# Patient Record
Sex: Male | Born: 1940 | Race: White | Hispanic: No | Marital: Married | Smoking: Never smoker
Health system: Southern US, Community
[De-identification: ages and names within clinical notes are randomized; demographics above are authoritative.]

## PROBLEM LIST (undated history)

## (undated) DIAGNOSIS — Z9289 Personal history of other medical treatment: Secondary | ICD-10-CM

## (undated) DIAGNOSIS — Z860100 Personal history of colon polyps, unspecified: Secondary | ICD-10-CM

## (undated) DIAGNOSIS — R000 Tachycardia, unspecified: Secondary | ICD-10-CM

## (undated) DIAGNOSIS — K5730 Diverticulosis of large intestine without perforation or abscess without bleeding: Secondary | ICD-10-CM

## (undated) DIAGNOSIS — G40909 Epilepsy, unspecified, not intractable, without status epilepticus: Secondary | ICD-10-CM

## (undated) DIAGNOSIS — K219 Gastro-esophageal reflux disease without esophagitis: Secondary | ICD-10-CM

## (undated) DIAGNOSIS — Z87442 Personal history of urinary calculi: Secondary | ICD-10-CM

## (undated) DIAGNOSIS — E785 Hyperlipidemia, unspecified: Secondary | ICD-10-CM

## (undated) DIAGNOSIS — A0472 Enterocolitis due to Clostridium difficile, not specified as recurrent: Secondary | ICD-10-CM

## (undated) HISTORY — DX: Epilepsy, unspecified, not intractable, without status epilepticus: G40.909

## (undated) HISTORY — DX: Tachycardia, unspecified: R00.0

## (undated) HISTORY — DX: Gastro-esophageal reflux disease without esophagitis: K21.9

## (undated) HISTORY — DX: Personal history of urinary calculi: Z87.442

## (undated) HISTORY — DX: Diverticulosis of large intestine without perforation or abscess without bleeding: K57.30

## (undated) HISTORY — DX: Personal history of other medical treatment: Z92.89

## (undated) HISTORY — PX: OTHER SURGICAL HISTORY: SHX169

## (undated) HISTORY — DX: Personal history of colon polyps, unspecified: Z86.0100

## (undated) HISTORY — PX: ADENOIDECTOMY: SHX5191

## (undated) HISTORY — DX: Enterocolitis due to Clostridium difficile, not specified as recurrent: A04.72

## (undated) HISTORY — PX: TONSILLECTOMY: SHX5217

## (undated) HISTORY — PX: SALIVARY GLAND SURGERY: SHX768

## (undated) HISTORY — DX: Hyperlipidemia, unspecified: E78.5

## (undated) HISTORY — PX: APPENDECTOMY: SHX54

## (undated) HISTORY — DX: Personal history of colonic polyps: Z86.010

## (undated) HISTORY — PX: CHOLECYSTECTOMY: SHX55

---

## 1998-06-23 ENCOUNTER — Emergency Department (HOSPITAL_COMMUNITY): Admission: EM | Admit: 1998-06-23 | Discharge: 1998-06-23 | Payer: Self-pay | Admitting: Emergency Medicine

## 1998-06-23 ENCOUNTER — Encounter: Payer: Self-pay | Admitting: Emergency Medicine

## 1998-12-01 ENCOUNTER — Encounter: Payer: Self-pay | Admitting: Urology

## 1998-12-01 ENCOUNTER — Ambulatory Visit (HOSPITAL_COMMUNITY): Admission: RE | Admit: 1998-12-01 | Discharge: 1998-12-01 | Payer: Self-pay | Admitting: Urology

## 2000-06-19 DIAGNOSIS — A0472 Enterocolitis due to Clostridium difficile, not specified as recurrent: Secondary | ICD-10-CM

## 2000-06-19 HISTORY — DX: Enterocolitis due to Clostridium difficile, not specified as recurrent: A04.72

## 2000-07-03 ENCOUNTER — Encounter: Payer: Self-pay | Admitting: Gastroenterology

## 2000-07-03 ENCOUNTER — Inpatient Hospital Stay (HOSPITAL_COMMUNITY): Admission: EM | Admit: 2000-07-03 | Discharge: 2000-07-06 | Payer: Self-pay | Admitting: Emergency Medicine

## 2000-07-04 ENCOUNTER — Encounter: Payer: Self-pay | Admitting: Internal Medicine

## 2001-12-27 ENCOUNTER — Encounter: Payer: Self-pay | Admitting: Cardiology

## 2005-05-18 ENCOUNTER — Ambulatory Visit: Payer: Self-pay | Admitting: Internal Medicine

## 2005-05-22 ENCOUNTER — Ambulatory Visit: Payer: Self-pay | Admitting: Internal Medicine

## 2005-05-31 ENCOUNTER — Ambulatory Visit: Payer: Self-pay | Admitting: Internal Medicine

## 2005-06-23 ENCOUNTER — Ambulatory Visit: Payer: Self-pay | Admitting: Cardiology

## 2005-06-23 ENCOUNTER — Ambulatory Visit: Payer: Self-pay | Admitting: Internal Medicine

## 2005-06-29 ENCOUNTER — Ambulatory Visit: Payer: Self-pay | Admitting: Cardiology

## 2005-07-21 ENCOUNTER — Ambulatory Visit: Payer: Self-pay | Admitting: Cardiology

## 2005-07-21 ENCOUNTER — Encounter: Payer: Self-pay | Admitting: Cardiology

## 2005-07-21 ENCOUNTER — Ambulatory Visit: Payer: Self-pay

## 2005-07-24 ENCOUNTER — Ambulatory Visit (HOSPITAL_COMMUNITY): Admission: RE | Admit: 2005-07-24 | Discharge: 2005-07-24 | Payer: Self-pay | Admitting: Internal Medicine

## 2005-07-24 ENCOUNTER — Ambulatory Visit: Payer: Self-pay | Admitting: Internal Medicine

## 2005-12-15 ENCOUNTER — Encounter: Payer: Self-pay | Admitting: Cardiology

## 2006-06-05 ENCOUNTER — Ambulatory Visit: Payer: Self-pay | Admitting: Internal Medicine

## 2006-06-05 LAB — CONVERTED CEMR LAB
AST: 21 units/L (ref 0–37)
Alkaline Phosphatase: 33 units/L — ABNORMAL LOW (ref 39–117)
BUN: 22 mg/dL (ref 6–23)
Basophils Absolute: 0 10*3/uL (ref 0.0–0.1)
Basophils Relative: 0.6 % (ref 0.0–1.0)
Bilirubin Urine: NEGATIVE
Cholesterol: 177 mg/dL (ref 0–200)
Creatinine, Ser: 1.2 mg/dL (ref 0.4–1.5)
Eosinophil percent: 6.3 % — ABNORMAL HIGH (ref 0.0–5.0)
GFR calc non Af Amer: 64 mL/min
Glomerular Filtration Rate, Af Am: 78 mL/min/{1.73_m2}
Hemoglobin, Urine: NEGATIVE
LDL Cholesterol: 125 mg/dL — ABNORMAL HIGH (ref 0–99)
Leukocytes, UA: NEGATIVE
Monocytes Absolute: 0.5 10*3/uL (ref 0.2–0.7)
Neutro Abs: 2.1 10*3/uL (ref 1.4–7.7)
Platelets: 164 10*3/uL (ref 150–400)
Potassium: 4.2 meq/L (ref 3.5–5.1)
RBC: 5.15 M/uL (ref 4.22–5.81)
RDW: 12.8 % (ref 11.5–14.6)
Sodium: 136 meq/L (ref 135–145)
TSH: 3.27 microintl units/mL (ref 0.35–5.50)
Triglyceride fasting, serum: 74 mg/dL (ref 0–149)
Urine Glucose: NEGATIVE mg/dL
Urobilinogen, UA: 1 (ref 0.0–1.0)
WBC: 4.3 10*3/uL — ABNORMAL LOW (ref 4.5–10.5)
pH: 6 (ref 5.0–8.0)

## 2006-06-08 ENCOUNTER — Ambulatory Visit: Payer: Self-pay | Admitting: Internal Medicine

## 2006-06-14 ENCOUNTER — Ambulatory Visit: Payer: Self-pay | Admitting: Internal Medicine

## 2006-11-21 ENCOUNTER — Encounter: Payer: Self-pay | Admitting: Cardiology

## 2007-04-08 ENCOUNTER — Telehealth (INDEPENDENT_AMBULATORY_CARE_PROVIDER_SITE_OTHER): Payer: Self-pay | Admitting: *Deleted

## 2007-05-20 HISTORY — PX: FRACTURE SURGERY: SHX138

## 2007-06-21 ENCOUNTER — Telehealth (INDEPENDENT_AMBULATORY_CARE_PROVIDER_SITE_OTHER): Payer: Self-pay | Admitting: *Deleted

## 2007-06-25 ENCOUNTER — Inpatient Hospital Stay (HOSPITAL_COMMUNITY): Admission: EM | Admit: 2007-06-25 | Discharge: 2007-06-28 | Payer: Self-pay | Admitting: Emergency Medicine

## 2007-06-26 ENCOUNTER — Ambulatory Visit: Payer: Self-pay | Admitting: Physical Medicine & Rehabilitation

## 2007-06-28 ENCOUNTER — Inpatient Hospital Stay (HOSPITAL_COMMUNITY)
Admission: RE | Admit: 2007-06-28 | Discharge: 2007-07-06 | Payer: Self-pay | Admitting: Physical Medicine & Rehabilitation

## 2007-07-01 ENCOUNTER — Encounter: Payer: Self-pay | Admitting: Physical Medicine & Rehabilitation

## 2007-07-01 ENCOUNTER — Ambulatory Visit: Payer: Self-pay | Admitting: Surgery

## 2007-07-06 ENCOUNTER — Encounter: Payer: Self-pay | Admitting: Endocrinology

## 2007-07-31 ENCOUNTER — Encounter: Payer: Self-pay | Admitting: Internal Medicine

## 2007-08-12 ENCOUNTER — Encounter: Payer: Self-pay | Admitting: Internal Medicine

## 2007-11-12 ENCOUNTER — Ambulatory Visit: Payer: Self-pay | Admitting: Internal Medicine

## 2007-11-12 DIAGNOSIS — Z87442 Personal history of urinary calculi: Secondary | ICD-10-CM

## 2007-11-12 DIAGNOSIS — K5730 Diverticulosis of large intestine without perforation or abscess without bleeding: Secondary | ICD-10-CM | POA: Insufficient documentation

## 2007-11-12 DIAGNOSIS — K219 Gastro-esophageal reflux disease without esophagitis: Secondary | ICD-10-CM

## 2007-11-12 DIAGNOSIS — Z860100 Personal history of colon polyps, unspecified: Secondary | ICD-10-CM | POA: Insufficient documentation

## 2007-11-12 DIAGNOSIS — M810 Age-related osteoporosis without current pathological fracture: Secondary | ICD-10-CM | POA: Insufficient documentation

## 2007-11-12 DIAGNOSIS — E785 Hyperlipidemia, unspecified: Secondary | ICD-10-CM | POA: Insufficient documentation

## 2007-11-25 ENCOUNTER — Encounter: Payer: Self-pay | Admitting: Cardiology

## 2008-06-01 ENCOUNTER — Encounter: Payer: Self-pay | Admitting: Cardiology

## 2008-08-13 ENCOUNTER — Ambulatory Visit: Payer: Self-pay | Admitting: Vascular Surgery

## 2008-08-13 ENCOUNTER — Ambulatory Visit: Admission: RE | Admit: 2008-08-13 | Discharge: 2008-08-13 | Payer: Self-pay | Admitting: Orthopedic Surgery

## 2008-08-13 ENCOUNTER — Encounter (INDEPENDENT_AMBULATORY_CARE_PROVIDER_SITE_OTHER): Payer: Self-pay | Admitting: Orthopedic Surgery

## 2008-09-03 ENCOUNTER — Encounter: Payer: Self-pay | Admitting: Internal Medicine

## 2008-09-03 ENCOUNTER — Ambulatory Visit: Payer: Self-pay | Admitting: Family Medicine

## 2008-10-23 ENCOUNTER — Encounter: Payer: Self-pay | Admitting: Cardiology

## 2008-11-12 ENCOUNTER — Ambulatory Visit: Payer: Self-pay | Admitting: Internal Medicine

## 2008-12-29 ENCOUNTER — Encounter: Payer: Self-pay | Admitting: Cardiology
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
## 2009-07-20 ENCOUNTER — Ambulatory Visit: Payer: Self-pay | Admitting: Internal Medicine

## 2009-07-20 DIAGNOSIS — R0609 Other forms of dyspnea: Secondary | ICD-10-CM | POA: Insufficient documentation

## 2009-07-20 DIAGNOSIS — R0989 Other specified symptoms and signs involving the circulatory and respiratory systems: Secondary | ICD-10-CM

## 2009-07-22 ENCOUNTER — Ambulatory Visit: Payer: Self-pay | Admitting: Internal Medicine

## 2009-07-29 ENCOUNTER — Ambulatory Visit (HOSPITAL_COMMUNITY): Admission: RE | Admit: 2009-07-29 | Discharge: 2009-07-29 | Payer: Self-pay | Admitting: Internal Medicine

## 2009-07-29 ENCOUNTER — Encounter: Payer: Self-pay | Admitting: Internal Medicine

## 2009-07-29 ENCOUNTER — Ambulatory Visit: Payer: Self-pay

## 2009-07-29 ENCOUNTER — Ambulatory Visit: Payer: Self-pay | Admitting: Cardiology

## 2009-07-30 ENCOUNTER — Encounter: Payer: Self-pay | Admitting: Internal Medicine

## 2009-07-30 ENCOUNTER — Telehealth: Payer: Self-pay | Admitting: Internal Medicine

## 2009-07-30 DIAGNOSIS — I428 Other cardiomyopathies: Secondary | ICD-10-CM

## 2009-08-03 ENCOUNTER — Ambulatory Visit: Payer: Self-pay | Admitting: Internal Medicine

## 2009-08-11 DIAGNOSIS — G40909 Epilepsy, unspecified, not intractable, without status epilepticus: Secondary | ICD-10-CM | POA: Insufficient documentation

## 2009-08-11 DIAGNOSIS — R569 Unspecified convulsions: Secondary | ICD-10-CM | POA: Insufficient documentation

## 2009-08-12 ENCOUNTER — Ambulatory Visit: Payer: Self-pay | Admitting: Cardiology

## 2009-08-18 ENCOUNTER — Telehealth (INDEPENDENT_AMBULATORY_CARE_PROVIDER_SITE_OTHER): Payer: Self-pay | Admitting: *Deleted

## 2009-08-19 ENCOUNTER — Ambulatory Visit: Payer: Self-pay | Admitting: Cardiology

## 2009-08-21 ENCOUNTER — Ambulatory Visit (HOSPITAL_COMMUNITY): Admission: RE | Admit: 2009-08-21 | Discharge: 2009-08-21 | Payer: Self-pay | Admitting: Obstetrics and Gynecology

## 2009-08-23 ENCOUNTER — Ambulatory Visit: Payer: Self-pay | Admitting: Cardiology

## 2009-08-23 ENCOUNTER — Inpatient Hospital Stay (HOSPITAL_BASED_OUTPATIENT_CLINIC_OR_DEPARTMENT_OTHER): Admission: RE | Admit: 2009-08-23 | Discharge: 2009-08-23 | Payer: Self-pay | Admitting: Cardiology

## 2009-08-23 ENCOUNTER — Encounter (INDEPENDENT_AMBULATORY_CARE_PROVIDER_SITE_OTHER): Payer: Self-pay | Admitting: *Deleted

## 2009-08-24 ENCOUNTER — Telehealth: Payer: Self-pay | Admitting: Cardiology

## 2009-08-27 ENCOUNTER — Ambulatory Visit: Payer: Self-pay | Admitting: Cardiology

## 2009-08-27 ENCOUNTER — Ambulatory Visit (HOSPITAL_COMMUNITY): Admission: RE | Admit: 2009-08-27 | Discharge: 2009-08-27 | Payer: Self-pay | Admitting: Cardiology

## 2009-08-31 ENCOUNTER — Ambulatory Visit: Payer: Self-pay | Admitting: Cardiology

## 2009-08-31 DIAGNOSIS — I2510 Atherosclerotic heart disease of native coronary artery without angina pectoris: Secondary | ICD-10-CM | POA: Insufficient documentation

## 2009-09-02 LAB — CONVERTED CEMR LAB
Albumin, U: DETECTED %
Albumin: 3.8 g/dL (ref 3.5–5.2)
Alpha 1, Urine: DETECTED % — AB
Alpha-1-Globulin: 3.9 % (ref 2.9–4.9)
CO2: 27 meq/L (ref 19–32)
Calcium: 9.1 mg/dL (ref 8.4–10.5)
Cholesterol: 176 mg/dL (ref 0–200)
Creatinine, Ser: 1.1 mg/dL (ref 0.4–1.5)
Ferritin: 361 ng/mL — ABNORMAL HIGH (ref 22.0–322.0)
Gamma Globulin, Urine: DETECTED % — AB
Gamma Globulin: 13.6 % (ref 11.1–18.8)
Glucose, Bld: 98 mg/dL (ref 70–99)
INR: 1 (ref 0.8–1.0)
LDL Cholesterol: 126 mg/dL — ABNORMAL HIGH (ref 0–99)
Potassium: 4.9 meq/L (ref 3.5–5.1)
Pro B Natriuretic peptide (BNP): 89 pg/mL (ref 0.0–100.0)
Prothrombin Time: 10.6 s (ref 9.1–11.7)
Total Bilirubin: 0.9 mg/dL (ref 0.3–1.2)
Total Protein, Serum Electrophoresis: 7.1 g/dL (ref 6.0–8.3)
Total Protein, Urine: 2.7 mg/dL
Total Protein: 6.5 g/dL (ref 6.0–8.3)

## 2009-10-01 ENCOUNTER — Ambulatory Visit: Payer: Self-pay | Admitting: Internal Medicine

## 2009-10-01 ENCOUNTER — Ambulatory Visit: Payer: Self-pay

## 2009-10-01 DIAGNOSIS — I493 Ventricular premature depolarization: Secondary | ICD-10-CM

## 2009-10-06 ENCOUNTER — Telehealth: Payer: Self-pay | Admitting: Cardiology

## 2009-10-13 ENCOUNTER — Telehealth: Payer: Self-pay | Admitting: Internal Medicine

## 2009-10-22 ENCOUNTER — Encounter: Payer: Self-pay | Admitting: Internal Medicine

## 2009-11-01 ENCOUNTER — Ambulatory Visit (HOSPITAL_COMMUNITY): Admission: RE | Admit: 2009-11-01 | Discharge: 2009-11-01 | Payer: Self-pay | Admitting: Internal Medicine

## 2009-11-01 ENCOUNTER — Encounter: Payer: Self-pay | Admitting: Cardiology

## 2009-11-01 ENCOUNTER — Ambulatory Visit: Payer: Self-pay | Admitting: Internal Medicine

## 2009-11-05 ENCOUNTER — Telehealth: Payer: Self-pay | Admitting: Internal Medicine

## 2009-11-08 ENCOUNTER — Telehealth: Payer: Self-pay | Admitting: Internal Medicine

## 2009-11-11 ENCOUNTER — Telehealth: Payer: Self-pay | Admitting: Internal Medicine

## 2009-11-12 ENCOUNTER — Telehealth (INDEPENDENT_AMBULATORY_CARE_PROVIDER_SITE_OTHER): Payer: Self-pay | Admitting: *Deleted

## 2009-11-12 ENCOUNTER — Telehealth: Payer: Self-pay | Admitting: Internal Medicine

## 2009-11-16 ENCOUNTER — Ambulatory Visit: Payer: Self-pay | Admitting: Internal Medicine

## 2009-11-16 LAB — CONVERTED CEMR LAB
ALT: 36 units/L (ref 0–53)
AST: 30 units/L (ref 0–37)
BUN: 23 mg/dL (ref 6–23)
Bilirubin, Direct: 0.1 mg/dL (ref 0.0–0.3)
Creatinine, Ser: 1.2 mg/dL (ref 0.4–1.5)
Eosinophils Relative: 8.1 % — ABNORMAL HIGH (ref 0.0–5.0)
Glucose, Bld: 107 mg/dL — ABNORMAL HIGH (ref 70–99)
HCT: 48.9 % (ref 39.0–52.0)
Hemoglobin: 16.9 g/dL (ref 13.0–17.0)
LDL Cholesterol: 62 mg/dL (ref 0–99)
Leukocytes, UA: NEGATIVE
Lymphocytes Relative: 31.3 % (ref 12.0–46.0)
MCHC: 34.4 g/dL (ref 30.0–36.0)
MCV: 95.6 fL (ref 78.0–100.0)
Monocytes Absolute: 0.8 10*3/uL (ref 0.1–1.0)
Monocytes Relative: 11.6 % (ref 3.0–12.0)
Neutrophils Relative %: 48.3 % (ref 43.0–77.0)
Nitrite: POSITIVE
RBC: 5.12 M/uL (ref 4.22–5.81)
RDW: 13.9 % (ref 11.5–14.6)
Specific Gravity, Urine: >=1.03 (ref 1.000–1.030)
TSH: 3.28 microintl units/mL (ref 0.35–5.50)
Triglycerides: 149 mg/dL (ref 0.0–149.0)
Urine Glucose: NEGATIVE mg/dL
Urobilinogen, UA: 1 (ref 0.0–1.0)
pH: 5.5 (ref 5.0–8.0)

## 2009-11-18 ENCOUNTER — Ambulatory Visit: Payer: Self-pay | Admitting: Cardiology

## 2009-12-03 ENCOUNTER — Ambulatory Visit: Payer: Self-pay | Admitting: Internal Medicine

## 2009-12-03 DIAGNOSIS — I472 Ventricular tachycardia: Secondary | ICD-10-CM

## 2009-12-03 LAB — CONVERTED CEMR LAB
BUN: 25 mg/dL — ABNORMAL HIGH (ref 6–23)
Basophils Absolute: 0 10*3/uL (ref 0.0–0.1)
Calcium: 9.5 mg/dL (ref 8.4–10.5)
Creatinine, Ser: 1.1 mg/dL (ref 0.4–1.5)
Eosinophils Absolute: 0.6 10*3/uL (ref 0.0–0.7)
GFR calc non Af Amer: 68.75 mL/min (ref >60)
Hemoglobin: 15.5 g/dL (ref 13.0–17.0)
Lymphocytes Relative: 33.6 % (ref 12.0–46.0)
Lymphs Abs: 2 10*3/uL (ref 0.7–4.0)
MCHC: 35.3 g/dL (ref 30.0–36.0)
MCV: 95.1 fL (ref 78.0–100.0)
Monocytes Absolute: 0.6 10*3/uL (ref 0.1–1.0)
Platelets: 141 10*3/uL — ABNORMAL LOW (ref 150.0–400.0)
WBC: 6.1 10*3/uL (ref 4.5–10.5)

## 2009-12-10 ENCOUNTER — Ambulatory Visit (HOSPITAL_COMMUNITY): Admission: RE | Admit: 2009-12-10 | Discharge: 2009-12-11 | Payer: Self-pay | Admitting: Internal Medicine

## 2009-12-10 ENCOUNTER — Ambulatory Visit: Payer: Self-pay | Admitting: Internal Medicine

## 2009-12-13 ENCOUNTER — Encounter (INDEPENDENT_AMBULATORY_CARE_PROVIDER_SITE_OTHER): Payer: Self-pay | Admitting: *Deleted

## 2009-12-14 ENCOUNTER — Encounter: Payer: Self-pay | Admitting: Cardiovascular Disease

## 2009-12-14 ENCOUNTER — Ambulatory Visit: Payer: Self-pay | Admitting: Internal Medicine

## 2009-12-22 ENCOUNTER — Ambulatory Visit: Payer: Self-pay | Admitting: Cardiology

## 2010-01-23 ENCOUNTER — Encounter: Payer: Self-pay | Admitting: Internal Medicine

## 2010-01-24 ENCOUNTER — Encounter: Payer: Self-pay | Admitting: Internal Medicine

## 2010-01-24 ENCOUNTER — Ambulatory Visit: Payer: Self-pay | Admitting: Internal Medicine

## 2010-01-24 ENCOUNTER — Ambulatory Visit (HOSPITAL_COMMUNITY): Admission: RE | Admit: 2010-01-24 | Discharge: 2010-01-24 | Payer: Self-pay | Admitting: Internal Medicine

## 2010-02-01 ENCOUNTER — Encounter: Payer: Self-pay | Admitting: Cardiology

## 2010-03-22 ENCOUNTER — Ambulatory Visit: Payer: Self-pay | Admitting: Cardiology

## 2010-03-29 ENCOUNTER — Inpatient Hospital Stay (HOSPITAL_COMMUNITY): Admission: RE | Admit: 2010-03-29 | Discharge: 2010-03-30 | Payer: Self-pay | Admitting: Orthopedic Surgery

## 2010-04-28 ENCOUNTER — Telehealth: Payer: Self-pay | Admitting: Cardiology

## 2010-05-27 ENCOUNTER — Telehealth (INDEPENDENT_AMBULATORY_CARE_PROVIDER_SITE_OTHER): Payer: Self-pay | Admitting: *Deleted

## 2010-06-17 ENCOUNTER — Encounter: Payer: Self-pay | Admitting: Internal Medicine

## 2010-07-04 ENCOUNTER — Telehealth: Payer: Self-pay | Admitting: Cardiology

## 2010-07-17 LAB — CONVERTED CEMR LAB
ALT: 45 units/L (ref 0–53)
Albumin: 4.2 g/dL (ref 3.5–5.2)
Albumin: 4.3 g/dL (ref 3.5–5.2)
Alkaline Phosphatase: 37 units/L — ABNORMAL LOW (ref 39–117)
Alkaline Phosphatase: 45 units/L (ref 39–117)
BUN: 20 mg/dL (ref 6–23)
Basophils Absolute: 0 10*3/uL (ref 0.0–0.1)
Basophils Relative: 0.2 % (ref 0.0–3.0)
Basophils Relative: 0.7 % (ref 0.0–1.0)
Bilirubin Urine: NEGATIVE
Bilirubin Urine: NEGATIVE
Bilirubin Urine: NEGATIVE
Bilirubin, Direct: 0.2 mg/dL (ref 0.0–0.3)
Bilirubin, Direct: 0.2 mg/dL (ref 0.0–0.3)
CO2: 29 meq/L (ref 19–32)
CO2: 29 meq/L (ref 19–32)
Calcium: 9.4 mg/dL (ref 8.4–10.5)
Calcium: 9.5 mg/dL (ref 8.4–10.5)
Chloride: 106 meq/L (ref 96–112)
Chloride: 106 meq/L (ref 96–112)
Chloride: 109 meq/L (ref 96–112)
Cholesterol: 187 mg/dL (ref 0–200)
Creatinine, Ser: 1.1 mg/dL (ref 0.4–1.5)
Eosinophils Relative: 3 % (ref 0.0–5.0)
GFR calc non Af Amer: 63.56 mL/min (ref >60)
Glucose, Bld: 103 mg/dL — ABNORMAL HIGH (ref 70–99)
Glucose, Bld: 96 mg/dL (ref 70–99)
HDL: 35.9 mg/dL — ABNORMAL LOW (ref >39.0)
Hemoglobin, Urine: NEGATIVE
Hemoglobin, Urine: NEGATIVE
Hemoglobin: 16.3 g/dL (ref 13.0–17.0)
Hemoglobin: 16.6 g/dL (ref 13.0–17.0)
Hemoglobin: 16.8 g/dL (ref 13.0–17.0)
LDL Cholesterol: 137 mg/dL — ABNORMAL HIGH (ref 0–99)
Leukocytes, UA: NEGATIVE
Leukocytes, UA: NEGATIVE
Leukocytes, UA: NEGATIVE
Lymphocytes Relative: 32.3 % (ref 12.0–46.0)
MCV: 91.6 fL (ref 78.0–100.0)
MCV: 91.6 fL (ref 78.0–100.0)
Monocytes Relative: 9.2 % (ref 3.0–12.0)
Monocytes Relative: 9.9 % (ref 3.0–12.0)
Neutrophils Relative %: 53.5 % (ref 43.0–77.0)
PSA: 0.37 ng/mL (ref 0.10–4.00)
PSA: 0.4 ng/mL (ref 0.10–4.00)
Platelets: 161 10*3/uL (ref 150.0–400.0)
Potassium: 4.4 meq/L (ref 3.5–5.1)
RBC: 5.13 M/uL (ref 4.22–5.81)
RBC: 5.25 M/uL (ref 4.22–5.81)
RDW: 12.1 % (ref 11.5–14.6)
RDW: 12.7 % (ref 11.5–14.6)
Sodium: 141 meq/L (ref 135–145)
Specific Gravity, Urine: >=1.03 (ref 1.000–1.03)
Specific Gravity, Urine: >=1.03 (ref 1.000–1.030)
Total Bilirubin: 1.3 mg/dL — ABNORMAL HIGH (ref 0.3–1.2)
Total CHOL/HDL Ratio: 5.1
Total Protein, Urine: NEGATIVE mg/dL
Total Protein, Urine: NEGATIVE mg/dL
Total Protein: 7.7 g/dL (ref 6.0–8.3)
Triglycerides: 83 mg/dL (ref 0.0–149.0)
Urine Glucose: NEGATIVE mg/dL
Urine Glucose: NEGATIVE mg/dL
Vit D, 1,25-Dihydroxy: 48 (ref 30–89)
WBC: 5.4 10*3/uL (ref 4.5–10.5)
WBC: 5.8 10*3/uL (ref 4.5–10.5)
WBC: 7.5 10*3/uL (ref 4.5–10.5)
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
## 2010-07-19 NOTE — Letter (Signed)
Summary: Cardiac Catheterization Instructions- [facility]  [facility], Main Office  [address]   [place], [place] [address]   Phone: [phone]  Fax: [phone]     08/12/2009 MRN: [id number]  PHAT [facility] [address] [place], [place]  [address]  Dear Mr. Aidynn, Krenn   You are scheduled for a Cardiac Catheterization on Monday March 7,2011 with Dr. Marca Ancona .  Please arrive to the 1st floor of the Heart and Vascular Center at [facility] at 7:30 am  on the day of your procedure. Please do not arrive before 6:30 a.m. Call the Heart and Vascular Center at [phone] if you are unable to make your appointmnet. The Code to get into the parking garage under the building is 9000. Take the elevators to the 1st floor. You must have someone to drive you home. Someone must be with you for the first 24 hours after you arrive home. Please wear clothes that are easy to get on and off and wear slip-on shoes. Do not eat or drink after midnight except water with your medications that morning. Bring all your medications and current insurance cards with you.    _x__ You may take ALL of your medications with water that morning.    The usual length of stay after your procedure is 2 to 3 hours. This can vary.  If you have any questions, please call the office at the number listed above.   Katina Dung, RN, BSN

## 2010-07-19 NOTE — Letter (Signed)
Summary: [facility] 2005 - 2007  [facility] 2005 - 2007   Imported By: Roderic Ovens 09/22/2009 16:22:15  _____________________________________________________________________  External Attachment:    Type:   Image     Comment:   External Document

## 2010-07-19 NOTE — Letter (Signed)
Summary: Generic Letter  Generic Letter   Imported By: Roderic Ovens 09/22/2009 16:19:50  _____________________________________________________________________  External Attachment:    Type:   Image     Comment:   External Document
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
## 2010-07-19 NOTE — Progress Notes (Signed)
Summary: f/u appt?  Phone Note Call from Patient Call back at Home Phone [phone]   Caller: Patient Summary of Call: pt called stating that he had appt with MD last week and was told to schedule 2 week f/u. pt has f/u schedule 02/15 and would like to know if MD still wants to see him since all his labs and CXR came back normal? Pt stated that he will only come if Dr. Jonny Ruiz says so.  Follow-up for Phone Call        he is correct; now that we have figured out the problem is most likely his heart condition;  he will be seen by cardiology soon, and then can be re-scheduled for me for 6 months - ROV Follow-up by: Corwin Levins MD,  July 30, 2009 5:20 PM  Additional Follow-up for Phone Call Additional follow up Details #1::        Patient notified and states that he has decided to come in tomorrow and see the MD anyway. Additional Follow-up by: Lucious Groves,  August 02, 2009 9:37 AM
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
## 2010-07-19 NOTE — Procedures (Signed)
Summary: summary report  summary report   Imported By: Mirna Mires 12/22/2009 12:30:12  _____________________________________________________________________  External Attachment:    Type:   Image     Comment:   External Document

## 2010-07-19 NOTE — Progress Notes (Signed)
Summary: [facility] Daily Weight Chart   [facility] Daily Weight Chart   Imported By: Roderic Ovens 01/27/2010 12:49:58  _____________________________________________________________________  External Attachment:    Type:   Image     Comment:   External Document

## 2010-07-19 NOTE — Cardiovascular Report (Signed)
Summary: Pre Cath Orders  Pre Cath Orders   Imported By: Roderic Ovens 08/31/2009 10:02:32  _____________________________________________________________________  External Attachment:    Type:   Image     Comment:   External Document

## 2010-07-19 NOTE — Procedures (Signed)
Summary: Holter and Event  Holter and Event   Imported By: Erle Crocker 10/22/2009 10:15:46  _____________________________________________________________________  External Attachment:    Type:   Image     Comment:   External Document
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
## 2010-07-19 NOTE — Progress Notes (Signed)
  Phone Note Outgoing Call   Details of Action Taken:  rei Summary of Call: I spoke with Dr. Despina Arias in [facility], the patient's neurologist.  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  He should be ok to start ASA 81 mg daily.  Dr. Epimenio Foot will have him back to the office and reimage him with an MRI.      Appended Document:  I spoke with the patient, he will start 81 mg ASA daily.
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
## 2010-07-19 NOTE — Letter (Signed)
Summary: ELectrophysiology/Ablation Procedure Instructions  [employer], Main Office  [address]   [place], [place] [address]   Phone: [phone]  Fax: [phone]     Electrophysiology/Ablation Procedure Instructions    You are scheduled for a(n) ablation on Friday, December 10, 2009 at 7:30 AM with Dr. Graciela Husbands and Allred.  1.  Please come to the Short Stay Center at [facility] at 5:30 AM on the day of your procedure.  2.  Come prepared to stay overnight.   Please bring your insurance cards and a list of your medications.  3.  Come to the [place] office on December 03, 2009 for lab work.  The lab at [facility] is open from 8:30 AM to 1:30 PM and 2:30 PM to 5:00 PM.  The lab at [facility] is open from 7:30 AM to 5:30 PM.  You do not have to be fasting.  4.  Do not have anything to eat or drink after midnight the night before your procedure.  5.  Do NOT take your Metoprolol morning of procedure.  All of your remaining medications may be taken with a small amount of water.     * Occasionally, EP studies and ablations can become lengthy.  Please make your family aware of this before your procedure starts.  Average time ranges from 2-8 hours for EP studies/ablations.  Your physician will locate your family after the procedure with the results.  * If you have any questions after you get home, please call the office at [phone].

## 2010-07-19 NOTE — Progress Notes (Signed)
Summary: set up ablation   Phone Note Call from Patient Call back at Home Phone [phone]   Caller: Patient Reason for Call: Talk to Nurse Summary of Call: pt wants to set up ablation , or does he need to come in office to see sk. Initial call taken by: Lorne Skeens,  October 13, 2009 9:14 AM  Follow-up for Phone Call        Talked with [name] and he will set up an appointment with Dr. Graciela Husbands to discuss treatment plan and results of Holter. Follow-up by: Lisabeth Devoid RN,  October 13, 2009 9:54 AM

## 2010-07-19 NOTE — Progress Notes (Signed)
Summary: pt calling re which dr to see  Phone Note Call from Patient   Caller: Patient [phone] Reason for Call: Talk to Nurse Summary of Call: pt calling re dr Georgian Co and allred having seen this pt, and according to him they were going to get together to see who will take over his care, he has not heard from [place] regarding this-pl call Initial call taken by: Glynda Jaeger,  April 28, 2010 2:21 PM  Follow-up for Phone Call        Talked with pt--he will schedule a followup with Dr Johney Frame in February 2012(according to Dr Jenel Lucks 01/24/10 note and Dr Alford Highland 04/01/10 note
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
## 2010-07-19 NOTE — Miscellaneous (Signed)
Summary: Orders Update pft charges  Clinical Lists Changes  Orders: Added new Service order of Carbon Monoxide diffusing w/capacity (94720) - Signed Added new Service order of Lung Volumes (94240) - Signed Added new Service order of Spirometry (Pre & Post) (94060) - Signed 

## 2010-07-19 NOTE — Letter (Signed)
Summary: [facility] Surgical Clearance   [facility] Surgical Clearance   Imported By: Roderic Ovens 02/07/2010 10:26:03  _____________________________________________________________________  External Attachment:    Type:   Image     Comment:   External Document

## 2010-07-19 NOTE — Progress Notes (Signed)
Summary: test result - ekg from [facility]  Phone Note Call from Patient Call back at Home Phone [phone] Call back at [phone]   Caller: Patient Reason for Call: Talk to Nurse, Lab or Test Results Summary of Call: status of ekg from [facility] 5/16 Initial call taken by: Lorne Skeens,  Nov 11, 2009 12:41 PM  Follow-up for Phone Call        normal saecg Follow-up by: Nathen May, MD, [facility],  Nov 11, 2009 5:23 PM

## 2010-07-19 NOTE — Progress Notes (Signed)
Summary: genetric meds  Phone Note From Pharmacy Call back at [facility] Phone [phone]   Caller: kim office [phone] Request: Speak with Nurse Details for Reason: can we use genetric -COREG 3.125 MG / .zocor 40 mg  Initial call taken by: Lorne Skeens,  August 24, 2009 11:37 AM  Follow-up for Phone Call        spoke with kim, okay given for pt to use carvedalol and simvastatin Deliah Goody, RN  August 24, 2009 12:38 PM
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
## 2010-07-19 NOTE — Progress Notes (Signed)
Summary: pt rtn a call from Friday  Phone Note Call from Patient Call back at Home Phone [phone] Call back at cell# [phone]   Caller: Patient Reason for Call: Talk to Nurse, Talk to Doctor Summary of Call: pt rtn someone's call from friday he is not sure who called but it was late and he was unable to rtn the call Friday Initial call taken by: Omer Jack,  Nov 08, 2009 11:02 AM  Follow-up for Phone Call        spoke with pt he is aware not to get to hospital until 9:00am on the 24th  Dr Graciela Husbands aware also that the case was moved down Dennis Bast, RN, BSN  Nov 08, 2009 1:40 PM

## 2010-07-19 NOTE — Miscellaneous (Signed)
Summary: Allergy  Allergy   Imported By: Elenor Legato 10/01/2009 09:38:27  _____________________________________________________________________  External Attachment:    Type:   Image     Comment:   External Document

## 2010-07-19 NOTE — Progress Notes (Signed)
Summary: ekg -from [facility]  Phone Note Call from Patient Call back at Home Phone [phone] Call back at [phone]   Caller: Patient Reason for Call: Talk to Nurse, Lab or Test Results Summary of Call: Per pt calling, Dr. Graciela Husbands had pt to go to [facility] on Monday for a ekg , pt haven't  heard results Initial call taken by: Lorne Skeens,  Nov 05, 2009 10:02 AM  Follow-up for Phone Call         11/05/09--11:30 am--pt calling wanting to know results of EKG done at [facility] following his appoint here with dr Harland German no EKG noted in system--will call [facility] to see if i can get results and call pt back--nt 11/05/09--called [facility] and got single average EKG on pt--advised pt that i couldn't give results as dr Graciela Husbands had not seen yet, but looked to be in sinus bradycardia--advised will give results to amber for dr Graciela Husbands to review--pt states he has been keeping tract of BP and pulse and they have been running 120/60 with pulse around high 40's to mid 50's --advised dr Graciela Husbands not in office until next thurs., but will leave mess for dr Graciela Husbands to call  him--pt agrees--nt Follow-up by: Ledon Snare, RN,  Nov 05, 2009 11:04 AM

## 2010-07-19 NOTE — Miscellaneous (Signed)
Summary: Orders Update  Clinical Lists Changes  Problems: Added new problem of CARDIOMYOPATHY (ICD-425.4) Orders: Added new Referral order of Cardiology Referral (Cardiology) - Signed
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
## 2010-07-19 NOTE — Progress Notes (Signed)
  Walk in Patient Form Recieved "Pt has question about what doctor to see" sent to Message Nurse [facility]  May 27, 2010 10:34 AM     Appended Document: F/U appt    Phone Note Outgoing Call   Call placed by: Sherri Rad, RN, BSN,  May 27, 2010 11:37 AM Call placed to: Patient Summary of Call: The pt had walked in and left a message wanting to know who he needed to f/u with. According to Dr. Alford Highland 10/11 note, he would see Dr. Shirlee Latch back on a as needed basis. According to Dr. Jenel Lucks note from 8/11 he neeed to f/u in 2/12 with Dr. Johney Frame. The pt has this appt. He states he keeps getting reminders for Dr. Shirlee Latch. I explained there is an old recall appt for Dr. Shirlee Latch, but I will contact scheduling to stop sending these to him. He is agreeable with this. Flag sent to [facility] in scheduling to d/c recalls for Dr. Shirlee Latch appts.  Initial call taken by: Sherri Rad, RN, BSN,  May 27, 2010 11:39 AM

## 2010-07-19 NOTE — Progress Notes (Signed)
Summary: ekg results/ has questions about his care and next step  Phone Note Call from Patient Call back at Home Phone [phone] Call back at [phone]   Caller: Patient Reason for Call: Talk to Nurse Summary of Call: calling again, did not heard anything back yesterday from the nurse about ekg results Initial call taken by: Migdalia Dk,  Nov 12, 2009 9:16 AM  Follow-up for Phone Call        Left message for pt on call back number listed.  Also called home number and spoke with wife.  Informed wife that pt's ekg is normal from 5/16 but that there is no other indication of f/u.  After speaking with wife, was made aware that pt is speaking with Letta Moynahan in our office in reference to his procedure time.  Follow-up by: Charolotte Capuchin, RN,  Nov 12, 2009 9:35 AM

## 2010-07-19 NOTE — Progress Notes (Signed)
  Faxed ROI over to [facility] 08/12/09,Recieved records back today forwarded to [facility] for 08/26/09 appt with Mclean. [facility]  August 18, 2009 8:21 AM

## 2010-07-19 NOTE — Letter (Signed)
Summary: [facility] [phone]  [facility] 2000- 2005   Imported By: Roderic Ovens 09/22/2009 16:23:13  _____________________________________________________________________  External Attachment:    Type:   Image     Comment:   External Document
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
## 2010-07-19 NOTE — Miscellaneous (Signed)
Summary: Appointment Canceled  Appointment status changed to canceled by [employer] on 12/13/2009 4:12 PM.  Cancellation Comments --------------------- echo/425.4/sec. horz/no prec. req/saf  Appointment Information ----------------------- Appt Type:  CARDIOLOGY ANCILLARY VISIT      Date:  Monday, January 03, 2010      Time:  8:30 AM for 60 min   Urgency:  Routine   Made By:  Pearson Grippe  To Visit:  [id number]    Reason:  echo/425.4/sec. horz/no prec. req/saf  Appt Comments ------------- -- 12/13/09 16:12: ([facility]) CANCELED -- echo/425.4/sec. horz/no prec. req/saf -- 11/18/09 10:05: ([facility]) BOOKED -- Routine CARDIOLOGY ANCILLARY VISIT at 01/03/2010 8:30 AM for 60 min echo/425.4/sec. horz/no prec. req/saf                                                                                                 PT. CX AND RSC APPT. 01/24/10 @8 :30

## 2010-07-19 NOTE — Progress Notes (Signed)
Summary: holter results / pt need a call early today/ call back   Phone Note Call from Patient Call back at Home Phone [phone] Call back at Work Phone    Caller: Patient Summary of Call: Results from echo Initial call taken by: Judie Grieve,  October 06, 2009 9:32 AM  Follow-up for Phone Call        talked with wife--requesting results of monitor done recently--Dr Graciela Husbands will review this afternoon and  will follow-up with patient after review of monitor Luana Shu   pt still waiting on heart monitor results. pt have to make arrangement to go to [place]. need results early today. [id number] [phone] Lorne Skeens  October 07, 2009 8:44 AM   Additional Follow-up for Phone Call Additional follow up Details #1::        PT AWARE MONITOR NOT REVIEWED AT THIS TIME INSTRUCTED ONCE REVIEWED WILL NOTIFY OF RESULTS. PER PT NEEDS RESULTS ASAP D/T NEEDING TO GO TO [place] FOR DEPOSITION.  Per pt calling calling back, pt still waiting for test results. is it o.k for him to go [place]. lawyer are waiting . pt aware that Wynona Canes did call him, advise pt i would send a message back to nurse.  [phone]/ [phone] Lorne Skeens  October 07, 2009 1:52 PM  Additional Follow-up by: Scherrie Bateman, LPN,  October 07, 2009 9:10 AM    Additional Follow-up for Phone Call Additional follow up Details #2::    PER DR Graciela Husbands OKAY TO TRAVEL.PT AWARE INFORMED PT TO CALL OFFICE WHEN GETS BACK IN TOWN TO SCHEDULE F/U WITH DR Graciela Husbands TO DISCUSS TX PLAN . B/P 111/71 HR 51 NO CHANGE  IN MEDS . VERBALIZED UNDERSTANDING. Follow-up by: Scherrie Bateman, LPN,  October 07, 2009 3:32 PM

## 2010-07-19 NOTE — Progress Notes (Signed)
     Follow-up for Phone Call       Follow-up by: Letta Moynahan, EMT,  Nov 12, 2009 9:40 AM    Additional Follow-up for Phone Call Additional follow up Details #2::    Spoke with Mr Haisley.. told him Dr Graciela Husbands had review SA EKG  and  said it was normal.   Confirmed with Dr Johney Frame schedule and Tresa Endo   that his  Ablation is scheduled for June 24 and to be at the Short Stay at  9 am.   Follow-up by: Letta Moynahan, EMT,  Nov 12, 2009 9:43 AM

## 2010-07-19 NOTE — Letter (Signed)
Summary: [facility] - Valproic Acid  [facility] - Valproic Acid   Imported By: Roderic Ovens 09/22/2009 15:40:26  _____________________________________________________________________  External Attachment:    Type:   Image     Comment:   External Document

## 2010-07-19 NOTE — Letter (Signed)
Summary: Appointment - Cardiac MRI  [facility]     [place], [place]    Phone:   Fax:       August 23, 2009 MRN: [id number]   Gary Atkins [address] [place], [place]  [address]   Dear [name],   We have scheduled the above patient for an appointment for a Cardiac MRI on 08-27-2009  at 1:00 p.m.  Please refer to the below information for the location and instructions for this test:  Location:     [facility]       [address]       [place], [place]  [address] Instructions:    Wilmon Arms at [facility] Outpatient Registration 45 minutes prior to your appointment time.  This will ensure you are in the Radiology Department 30 minutes prior to your appointment.    There are no restrictions for this test you may eat and take medications as usual.  If you need to reschedule this appointment please call at the number listed above.    Sincerely,      Lorne Skeens [facility] Scheduling Team
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
## 2010-07-21 NOTE — Progress Notes (Signed)
Summary: cost of med is high   Phone Note Call from Patient Call back at Home Phone [phone]   Caller: Patient [phone] cell phone Reason for Call: Talk to Nurse Summary of Call: the cost of meds is to high-METOPROLOL SUCCINATE 50 from 5$ to $ 45.00.  Initial call taken by: Lorne Skeens,  July 04, 2010 9:57 AM  Follow-up for Phone Call        talked with pt--copay for metoprolol succinate  has increased --he is asking if  he can change to an alternative--reviewed with Dr Darvin Neighbours would like for pt to continue metoprolol succinate but will consider alternative if financial hardship--pt states he will continue metoprolol succinate

## 2010-07-21 NOTE — Letter (Signed)
Summary: Colonoscopy Letter  [facility]  [address] [place], [place] [address]   Phone: [phone]  Fax: [phone]      June 17, 2010 MRN: [id number]   Gary Atkins [address] [place], [place]  [address]   Dear Mr. Lobosco,   According to your medical record, it is time for you to schedule a Colonoscopy. The American Cancer Society recommends this procedure as a method to detect early colon cancer. Patients with a family history of colon cancer, or a personal history of colon polyps or inflammatory bowel disease are at increased risk.  This letter has been generated based on the recommendations made at the time of your procedure. If you feel that in your particular situation this may no longer apply, please contact our office.  Please call our office at [phone] to schedule this appointment or to update your records at your earliest convenience.  Thank you for cooperating with [place] to provide you with the very best care possible.   Sincerely,  Wilhemina Bonito. Marina Goodell, M.D.  [facility]astroenter[facility] Division [phone]

## 2010-08-02 ENCOUNTER — Encounter (INDEPENDENT_AMBULATORY_CARE_PROVIDER_SITE_OTHER): Payer: Self-pay | Admitting: *Deleted

## 2010-08-04 ENCOUNTER — Encounter: Payer: Self-pay | Admitting: Internal Medicine

## 2010-08-10 NOTE — Miscellaneous (Signed)
Summary: [facility] PV  Clinical Lists Changes  Medications: Added new medication of MOVIPREP 100 GM  SOLR (PEG-KCL-NACL-NASULF-NA ASC-C) As per prep instructions. - Signed Rx of MOVIPREP 100 GM  SOLR (PEG-KCL-NACL-NASULF-NA ASC-C) As per prep instructions.;  #1 x 0;  Signed;  Entered by: Ezra Sites RN;  Authorized by: Hilarie Fredrickson MD;  Method used: Electronically to [facility]  [phone]*, [address] [place], [place], [place]  [address], Ph: [phone] or [phone], Fax: [phone] Observations: Added new observation of ALLERGY REV: Done (08/04/2010 10:32)    Prescriptions: MOVIPREP 100 GM  SOLR (PEG-KCL-NACL-NASULF-NA ASC-C) As per prep instructions.  #1 x 0   Entered by:   Ezra Sites RN   Authorized by:   Hilarie Fredrickson MD   Signed by:   Ezra Sites RN on 08/04/2010   Method used:   Electronically to        [facility]  [phone]* (retail)       [address]       [place], [place]  [address]       Ph: [phone] or [phone]       Fax: [phone]   RxID:   [id number]

## 2010-08-10 NOTE — Letter (Signed)
Summary: [facility] Instructions  [facility]  [address] [place], [place] [address]   Phone: [phone]  Fax: [phone]       Gary Atkins    February 19, 1941    MRN: [id number]        Procedure Day Dorna Bloom: Lenor Coffin  08/18/10     Arrival Time:  7:30am     Procedure Time:  8:30am     Location of Procedure:                    Juliann Pares  [facility] (4th Floor)                        PREPARATION FOR COLONOSCOPY WITH MOVIPREP   Starting 5 days prior to your procedure  SATURDAY 02/25  do not eat nuts, seeds, popcorn, corn, beans, peas,  salads, or any raw vegetables.  Do not take any fiber supplements (e.g. Metamucil, Citrucel, and Benefiber).  THE DAY BEFORE YOUR PROCEDURE         DATE:  [facility]  02/29  1.  Drink clear liquids the entire day-NO SOLID FOOD  2.  Do not drink anything colored red or purple.  Avoid juices with pulp.  No orange juice.  3.  Drink at least 64 oz. (8 glasses) of fluid/clear liquids during the day to prevent dehydration and help the prep work efficiently.  CLEAR LIQUIDS INCLUDE: Water Jello Ice Popsicles Tea (sugar ok, no milk/cream) Powdered fruit flavored drinks Coffee (sugar ok, no milk/cream) Gatorade Juice: apple, white grape, white cranberry  Lemonade Clear bullion, consomm, broth Carbonated beverages (any kind) Strained chicken noodle soup Hard Candy                             4.  In the morning, mix first dose of MoviPrep solution:    Empty 1 Pouch A and 1 Pouch B into the disposable container    Add lukewarm drinking water to the top line of the container. Mix to dissolve    Refrigerate (mixed solution should be used within 24 hrs)  5.  Begin drinking the prep at 5:00 p.m. The MoviPrep container is divided by 4 marks.   Every 15 minutes drink the solution down to the next mark (approximately 8 oz) until the full liter is complete.   6.  Follow completed prep with 16 oz of clear liquid of your choice  (Nothing red or purple).  Continue to drink clear liquids until bedtime.  7.  Before going to bed, mix second dose of MoviPrep solution:    Empty 1 Pouch A and 1 Pouch B into the disposable container    Add lukewarm drinking water to the top line of the container. Mix to dissolve    Refrigerate  THE DAY OF YOUR PROCEDURE      DATE:  THURSDAY  03/01  Beginning at  3:30 a.m. (5 hours before procedure):         1. Every 15 minutes, drink the solution down to the next mark (approx 8 oz) until the full liter is complete.  2. Follow completed prep with 16 oz. of clear liquid of your choice.    3. You may drink clear liquids until 6:30am  (2 HOURS BEFORE PROCEDURE).   MEDICATION INSTRUCTIONS  Unless otherwise instructed, you should take regular prescription medications with a small sip of water   as  early as possible the morning of your procedure.           OTHER INSTRUCTIONS  You will need a responsible adult at least 72 years of age to accompany you and drive you home.   This person must remain in the waiting room during your procedure.  Wear loose fitting clothing that is easily removed.  Leave jewelry and other valuables at home.  However, you may wish to bring a book to read or  an iPod/MP3 player to listen to music as you wait for your procedure to start.  Remove all body piercing jewelry and leave at home.  Total time from sign-in until discharge is approximately 2-3 hours.  You should go home directly after your procedure and rest.  You can resume normal activities the  day after your procedure.  The day of your procedure you should not:   Drive   Make legal decisions   Operate machinery   Drink alcohol   Return to work  You will receive specific instructions about eating, activities and medications before you leave.    The above instructions have been reviewed and explained to me by   Ezra Sites RN  August 04, 2010 10:55 AM     I fully  understand and can verbalize these instructions _____________________________ Date _________
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
## 2010-08-17 ENCOUNTER — Ambulatory Visit (INDEPENDENT_AMBULATORY_CARE_PROVIDER_SITE_OTHER): Payer: Medicare Other | Admitting: Internal Medicine

## 2010-08-17 ENCOUNTER — Encounter: Payer: Self-pay | Admitting: Internal Medicine

## 2010-08-17 DIAGNOSIS — I5022 Chronic systolic (congestive) heart failure: Secondary | ICD-10-CM

## 2010-08-17 DIAGNOSIS — I495 Sick sinus syndrome: Secondary | ICD-10-CM

## 2010-08-17 DIAGNOSIS — I4949 Other premature depolarization: Secondary | ICD-10-CM

## 2010-08-18 ENCOUNTER — Other Ambulatory Visit (AMBULATORY_SURGERY_CENTER): Payer: Medicare Other | Admitting: Internal Medicine

## 2010-08-18 ENCOUNTER — Other Ambulatory Visit: Payer: Self-pay | Admitting: Internal Medicine

## 2010-08-18 DIAGNOSIS — D126 Benign neoplasm of colon, unspecified: Secondary | ICD-10-CM

## 2010-08-18 DIAGNOSIS — K5730 Diverticulosis of large intestine without perforation or abscess without bleeding: Secondary | ICD-10-CM

## 2010-08-18 DIAGNOSIS — Z1211 Encounter for screening for malignant neoplasm of colon: Secondary | ICD-10-CM

## 2010-08-18 DIAGNOSIS — Z86010 Personal history of colonic polyps: Secondary | ICD-10-CM
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
## 2010-08-23 ENCOUNTER — Encounter: Payer: Self-pay | Admitting: Internal Medicine

## 2010-08-25 NOTE — Procedures (Addendum)
Summary: Colonoscopy  Patient: Gary Atkins Note: All result statuses are Final unless otherwise noted.  Tests: (1) Colonoscopy (COL)   COL Colonoscopy           DONE     [facility]     [address]     [place], [place]  [address]          COLONOSCOPY PROCEDURE REPORT          PATIENT:  Gary Atkins, Gary Atkins  MR#:  [id number]     BIRTHDATE:  April 15, 1941, 69 yrs. old  GENDER:  male     ENDOSCOPIST:  Wilhemina Bonito. Eda Keys, MD     REF. BY:  Surveillance Program Recall,     PROCEDURE DATE:  08/18/2010     PROCEDURE:  Colonoscopy with snare polypectomy x 1     ASA CLASS:  Class II     INDICATIONS:  history of polyps, surveillance and high-risk     screening ; index exam 06-2005 w/ NAP. Fam hx adenomatous polyps     and CRC in GP     MEDICATIONS:   Fentanyl 75 mcg IV, Versed 9 mg IV          DESCRIPTION OF PROCEDURE:   After the risks benefits and     alternatives of the procedure were thoroughly explained, informed     consent was obtained.  Digital rectal exam was performed and     revealed no abnormalities.   The LB [id number]-[id number] endoscope     was introduced through the anus and advanced to the cecum, which     was identified by both the appendix and ileocecal valve, without     limitations.Time to cecum = 3:35 min. The quality of the prep was     excellent, using MoviPrep.  The instrument was then slowly     withdrawn (time = 9:48 min) as the colon was fully examined.     <<PROCEDUREIMAGES>>          FINDINGS:  A diminutive polyp was found in the mid transverse     colon. Polyp was snared without cautery. Retrieval was successful.     Severe diverticulosis was found in the left colon.  Otherwise     normal colonoscopy without other polyps, masses, vascular     ectasias, or inflammatory changes.   Retroflexed views in the     rectum revealed no abnormalities.    The scope was then withdrawn     from the patient and the procedure completed.          COMPLICATIONS:  None         ENDOSCOPIC IMPRESSION:     1) Diminutive polyp in the mid transverse colon - removed     2) Severe diverticulosis in the left colon     3) Otherwise normal colonoscopy          RECOMMENDATIONS:     1) Repeat colonoscopy in 5 years if polyp adenomatous; otherwise     10 years          ______________________________     Wilhemina Bonito. Eda Keys, MD          CC:  Corwin Levins, MD; The Patient          n.     eSIGNED:   Wilhemina Bonito. Eda Keys at 08/18/2010 09:20 AM          Elie Confer, [id number]  Note: An exclamation mark Marland Kitchen)  indicates a result that was not dispersed into the flowsheet. Document Creation Date: 08/18/2010 9:20 AM _______________________________________________________________________  (1) Order result status: Final Collection or observation date-time: 08/18/2010 09:07 Requested date-time:  Receipt date-time:  Reported date-time:  Referring Physician:   Ordering Physician: Fransico Setters [phone]) Specimen Source:  Source: Launa Grill Order Number: [id number] Lab site:   Appended Document: Colonoscopy recall     Procedures Next Due Date:    Colonoscopy: 08/2015  Appended Document: Colonoscopy recall made in error- recall due 08-17-20
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
## 2010-08-30 NOTE — Letter (Signed)
Summary: Patient Notice- Polyp Results  [facility]  [address] [place], [place] [address]   Phone: [phone]  Fax: [phone]        August 23, 2010 MRN: [id number]    Gary Atkins [address] [place], [place]  [address]    Dear [name],  I am pleased to inform you that the colon polyp(s) removed during your recent colonoscopy was (were) found to be benign (no cancer detected) upon pathologic examination.  I recommend you have a repeat colonoscopy examination in 10 years to look for recurrent polyps, as having colon polyps increases your risk for having recurrent polyps or even colon cancer in the future.  Should you develop new or worsening symptoms of abdominal pain, bowel habit changes or bleeding from the rectum or bowels, please schedule an evaluation with either your primary care physician or with me.  Additional information/recommendations:  __ No further action with gastroenterology is needed at this time. Please      follow-up with your primary care physician for your other healthcare      needs.    Please call us if you are having persistent problems or have questions about your condition that have not been fully answered at this time.  Sincerely,  Hilarie Fredrickson MD  This letter has been electronically signed by your physician.  Appended Document: Patient Notice- Polyp Results letter mailed

## 2010-08-31 ENCOUNTER — Telehealth: Payer: Self-pay | Admitting: Cardiology

## 2010-09-01 LAB — CBC
MCV: 95.2 fL (ref 78.0–100.0)
Platelets: 109 10*3/uL — ABNORMAL LOW (ref 150–400)
Platelets: 126 10*3/uL — ABNORMAL LOW (ref 150–400)
RDW: 13.4 % (ref 11.5–15.5)
RDW: 13.7 % (ref 11.5–15.5)
WBC: 6.2 10*3/uL (ref 4.0–10.5)

## 2010-09-01 LAB — BASIC METABOLIC PANEL
BUN: 14 mg/dL (ref 6–23)
Creatinine, Ser: 1.04 mg/dL (ref 0.4–1.5)
GFR calc non Af Amer: >60 mL/min (ref >60)
GFR calc non Af Amer: >60 mL/min (ref >60)
Glucose, Bld: 103 mg/dL — ABNORMAL HIGH (ref 70–99)
Potassium: 4.6 mEq/L (ref 3.5–5.1)
Sodium: 142 mEq/L (ref 135–145)

## 2010-09-01 LAB — LIPID PANEL
Cholesterol: 99 mg/dL (ref 0–200)
LDL Cholesterol: 46 mg/dL (ref 0–99)
Triglycerides: 107 mg/dL (ref <150)

## 2010-09-01 LAB — URINALYSIS, ROUTINE W REFLEX MICROSCOPIC
Bilirubin Urine: NEGATIVE
Glucose, UA: NEGATIVE mg/dL
Ketones, ur: NEGATIVE mg/dL
Nitrite: NEGATIVE
Protein, ur: NEGATIVE mg/dL
Specific Gravity, Urine: 1.023 (ref 1.005–1.030)
Urobilinogen, UA: 1 mg/dL (ref 0.0–1.0)

## 2010-09-01 LAB — DIFFERENTIAL
Basophils Relative: 0 % (ref 0–1)
Eosinophils Absolute: 0.4 10*3/uL (ref 0.0–0.7)
Lymphocytes Relative: 26 % (ref 12–46)
Monocytes Absolute: 0.5 10*3/uL (ref 0.1–1.0)
Neutro Abs: 3.2 10*3/uL (ref 1.7–7.7)

## 2010-09-01 LAB — TYPE AND SCREEN: Antibody Screen: NEGATIVE

## 2010-09-01 LAB — SURGICAL PCR SCREEN: MRSA, PCR: NEGATIVE

## 2010-09-01 LAB — PROTIME-INR: Prothrombin Time: 13.8 seconds (ref 11.6–15.2)

## 2010-09-01 LAB — ABO/RH: ABO/RH(D): A POS

## 2010-09-06 NOTE — Progress Notes (Signed)
Summary: pt wants to take a trip  Phone Note Call from Patient   Caller: Patient [phone] ok to leave msg Reason for Call: Talk to Nurse Summary of Call: pt wants to take a trip to [place] this fall, not sure of the elevation wants to ok this with [place] Initial call taken by: Glynda Jaeger,  August 31, 2010 10:27 AM  Follow-up for Phone Call        I reviewed with Dr Wilmon Pali for pt to travel to [place]--Dr Shirlee Latch recommended pt avoid any exercise or strenuous activity--He could expect to be a little more SOB--I discussed with pt and he verbalized understanding

## 2010-09-09 LAB — DIFFERENTIAL
Eosinophils Absolute: 0.3 10*3/uL (ref 0.0–0.7)
Eosinophils Relative: 5 % (ref 0–5)
Lymphs Abs: 1.8 10*3/uL (ref 0.7–4.0)

## 2010-09-09 LAB — CBC
HCT: 49.1 % (ref 39.0–52.0)
MCV: 94.8 fL (ref 78.0–100.0)
Platelets: 155 10*3/uL (ref 150–400)
RDW: 12.9 % (ref 11.5–15.5)

## 2010-09-09 LAB — POCT I-STAT 3, ART BLOOD GAS (G3+)
TCO2: 25 mmol/L (ref 0–100)
pH, Arterial: 7.363 (ref 7.350–7.450)

## 2010-09-09 LAB — POCT I-STAT 3, VENOUS BLOOD GAS (G3P V)
Bicarbonate: 23.6 mEq/L (ref 20.0–24.0)
pH, Ven: 7.346 — ABNORMAL HIGH (ref 7.250–7.300)
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
## 2010-11-01 NOTE — Assessment & Plan Note (Signed)
[facility]                         ELECTROPHYSIOLOGY OFFICE NOTE   KYLAR, LEONHARDT                    MRN:          [id number]  DATE:10/01/2009                            DOB:          [birth year]    Mr. Burbach is seen at the request of the orthopedic physician because  of her resting bradycardias 25-30 beats per minute.   He is a patient of Dr. Shirlee Latch, who has a history of a cardiomyopathy  recently identified.  By catheterization and MRI scanning, it was  demonstrated a nonischemic with ejection fraction of somewhere between  30 and 44%.  He has had problems with dyspnea on exertion as well as  orthostatic lightheadedness.  He has had no palpitations.   His current medications include:  1. Coreg 6.25 b.i.d.  2. Lisinopril 5 b.i.d.  3. Aspirin.  4. Actonel.  5. Omeprazole.   His past medical history in addition to above is notable for seizure  disorder, reflux disease, diverticulosis, nephrolithiasis, osteoporosis.   FAMILY HISTORY:  Noncontributory.   SOCIAL HISTORY:  Noncontributory.   REVIEW OF SYSTEMS:  Broadly negative except as noted previously.   PHYSICAL EXAMINATION:  VITAL SIGNS/GENERAL:  On examination, his blood  pressure is 85/78, his pulse was 28 by palpation, and he was in no acute  distress.  HEENT:  Negative.  NECK:  His neck veins were flat.  His carotids are brisk and full  bilaterally without bruits.  BACK:  Without kyphosis or scoliosis.  HEART:  Heart sounds were regular and slow by palpation and slow and  irregular by auscultation with a pattern of couplet bigeminy.  ABDOMEN:  Soft with active bowel sounds.  Femoral pulses were 2+.  Distal pulses were intact.  EXTREMITIES:  There is no clubbing, cyanosis, or edema.  NEUROLOGICAL:  Grossly normal.  He was hard of hearing and he had  hearing aids in place.   Electrocardiogram today demonstrated sinus rhythm with an undiscernible  rate, the measured rate is  about 25.  There is ventricular ectopy and a  couplet formation with right bundle-branch block, inferior axis  morphology with transition at V3-V4.   IMPRESSION:  1. Frequent ventricular ectopy.  2. Cardiomyopathy, question the cause of, or caused by the above.  3. Orthostatic lightheadedness, likely related to the above and      functional bradycardia.  4. Cardiomyopathy, nonobstructive coronary disease.   Mr. Amble has frequent ventricular ectopy in the setting of a  cardiomyopathy.  The question of the cause of relationship with the PVCs  with cardiomyopathy has been raised.  It will be important I think to  try to eliminate the PVCs either with a beta-blockers or possibly  catheter ablation or antiarrhythmic drugs.  The impact on the  cardiomyopathy will only be discernible in retrospect.   I reviewed this with Dr. Shirlee Latch who is in agreement.   Right now, we will plan to undertake a Holter monitor to quantitate his  PVCs.  We will decrease his lisinopril as he is significantly  orthostatically intolerant.  We will change his beta-blocker from  carvedilol to metoprolol succinate,  clearly that has more beta-blocker  effect for the blood pressure effects.   We will review these data with him in next week.     Duke Salvia, MD, [facility]  Electronically Signed    SCK/MedQ  DD: 10/01/2009  DT: 10/02/2009  Job #: [id number]
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
## 2010-11-04 NOTE — Procedures (Signed)
[facility]  Patient:    Gary Atkins, Gary Atkins                          MRN: [id number] Proc. Date: 07/04/00 Adm. Date:  [id number] Attending:  Estella Husk CC:         Ulyess Mort, M.D. [facility]             Corwin Levins, M.D. [facility]                           Procedure Report  PROCEDURE PERFORMED:  Endoscopic retrograde cholangiopancreatography with biliary sphincterotomy and common bile duct stone extraction.  ENDOSCOPIST:  Wilhemina Bonito. Eda Keys., M.D. [facility]  INDICATIONS FOR PROCEDURE:  Abdominal pain and abnormal abdominal CT scan.  HISTORY:  The patient is a 70 year old gentleman, previously healthy, who was seen by Dr. Jonny Ruiz approximately one week ago with midabdominal pain and questionable fevers.  The patient underwent CT scan of the abdomen which suggested possible abnormality in the region of the head of the pancreas.  As well, mild biliary ductal dilation.  ERCP was recommended.  The patient has had intermittent nausea and vomiting.  He was admitted to the hospital yesterday.  Liver tests were essentially normal.  Now for ERCP.  The nature of the procedure as well as the risks, benefits and alternatives were reviewed. He understood and agreed to proceed.  PHYSICAL EXAMINATION:  Anxious gentleman, slightly ill-appearing in no acute distress.  He is alert and oriented.  Vital signs are stable.  Lungs are clear.  Heart is regular.  Abdomen soft with minimal diffuse tenderness.  DESCRIPTION OF PROCEDURE:  After informed consent was obtained, the patient was sedated with 100 mcg of fentanyl and 10 mg of Versed IV.  Glucagon 1.5 mg IV was given as a duodenal relaxant.  Preoperative antibiotics included Unasyn 1.5 gm IV.  The Olympus therapeutic side-viewing endoscope was passed blindly into the esophagus.  The distal esophagus appeared to have some mild mucosal friability at the mucosal Z-line.  Other portions of the esophagus could not be  examined.  The stomach was normal except for a small sliding hiatal hernia. The duodenal bulb was normal.  The postbulbar duodenum was remarkable for a periampullary diverticulum.  The major ampulla was normal.  The minor ampulla was not sought.  X-RAY FINDINGS: 1. Scout radiograph of the abdomen with the endoscope in position revealed no    abnormalities. 2. The pancreatic duct was filled to the midbody with no abnormality noted. 3. Common bile duct was completely opacified and deeply cannulated.  The    common bile duct was approximately 8 mm with multiple filling defects    consistent with stones.  These measured between 5 and 10 mm.  The cystic    duct was patent.  THERAPY:  With the guide wire in the proximal biliary tree, a standard sphincterotomy was made over the guide wire with cutting in the 12 oclock orientation.  Cutting was performed with the ERBE system.  Sphincterotomy size was deemed large. The cutting catheter was exchanged for a 12 mm balloon.  Six to eight soft, cholesterol-type stones were extracted.  Postextraction, an occlusion cholangiogram  revealed no residual filling defects.  Excellent drainage demonstrated.  IMPRESSION:  Choledocholithiasis, status post ERCP with sphincterotomy and common duct stone extraction.  RECOMMENDATIONS: 1. Ciprofloxacin IV. 2. With fevers and diarrhea, rule  out Clostridium difficile toxin, given    prior exposure to antibiotics. 3. General surgery referral for laparoscopic cholecystectomy. DD:  07/04/00 TD:  07/04/00 Job: [id number]

## 2010-11-04 NOTE — Discharge Summary (Signed)
[place]. [facility]  Patient:    Gary Atkins, Gary Atkins                          MRN: [id number] Adm. Date:  [id number] Disc. Date: [id number] Attending:  Estella Husk Dictator:   Dianah Field, P.A. CC:         Catalina Lunger, M.D.  Corwin Levins, M.D. [facility]   Discharge Summary  DATE OF BIRTH:  1940-12-20  ADMISSION DIAGNOSES: 1. A 10-day history of midabdominal pain, associated with nausea and    vomiting.  CT findings on a CT scan of last week showed intrahepatic    and extrahepatic ductal dilatation, along with a questionable mass at    the head of the pancreas.  Liver function tests mildly elevated one    week ago.  Rule out pancreatic carcinoma.  Rule out choledocholithiasis/    cholelithiasis not detected by CT scan. 2. Seizure disorder, of unknown etiology. 3. History of nephrolithiasis. 4. History of diverticulosis, with no evidence for acute diverticulitis    by recent CT scan. 5. History of compression fractures to the lower spine, occurring at the    time of initial seizures in June 2000. 6. Status post appendectomy. 7. Status post tonsillectomy and adenoidectomy. 8. Status post right parotid gland surgery.  DISCHARGE DIAGNOSES: 1. Choledocholithiasis, extensive.  Status post endoscopic retrograde    cholangiopancreatography with sphincterotomy and successful extraction of    multiple large stones. 2. Status post laparoscopic cholecystectomy on July 04, 2000, by    Dr. Catalina Lunger. 3. Diarrhea, Clostridium difficile positive.  The patient had been taking    Augmentin for the last seven days. 4. Seizure disorder, no activity, maintained on chronic Depakote. 5. Fever and leukocytosis, probably secondary to Clostridium difficle    colitis, less likely secondary to cholangitis.  No evidence for pneumonia    or urinary tract infection.  PROCEDURE: 1. Endoscopic retrograde cholangiopancreatography on July 04, 2000,    by Dr. Wilhemina Bonito. Eda Keys.  This study showed normal stomach, bulb,    and second portion of the duodenum and ampulla.  There was a duodenal    diverticulum present.  The pancreatic duct was within normal limits.    Mild dilatation of the common bile duct with multiple stones imaged.    Standard sphincterotomy performed using the Erbe.  Six to eight stones    were removed with the balloon catheter. 2. Laparoscopic cholecystectomy by Dr. Luan Pulling, uncomplicated surgery.  CONSULTATION:  Dr. Luan Pulling for surgical evaluation and removal of gallbladder.  HISTORY OF PRESENT ILLNESS:  Gary Atkins is a somewhat laconic but pleasant 70 year old white male who has a known history of diverticulosis.  When he did have a colonoscopy by Dr. Ulyess Mort in 1996, diverticulosis was noted along with a benign ulcer at the ileocecal valve.  There was some evidence that the patient had had prior diverticulitis as well, however, the patient never had any medical management of diverticulitis.  He has a history  of seizures, for which the etiolology is not known, although the patient and his wife say that it is because of a leaking blood vessel in the brain, however, the correspondence from his neurologist, Dr. Epimenio Foot, in [facility] states that the etiology of his seizures is not known.  About 10 days prior to this admission he developed the acute onset of abdominal pain just above  the umbilicus.  The pain was nonradiating and the severity was intense, comparable to that of kidney stones.  He vomited that day, but it was clear emesis.  The pain lasted for several hours, and finally subsided to the point where he was able to sleep the night through, and on Monday felt better.  On Tuesday the pain recurred, so he went to see Dr. Corwin Levins in the office on Wednesday.  Labs were obtained, and a CT scan was performed on Thursday at [facility].  The CT scan showed ductal dilatation of both  the intrahepatic and the common bile duct.  The pancreatic duct was not dilated.  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  The ERCP was recommended. The labs showed some slight abnormalities in the liver function tests, with a total bilirubin of 1.4, alkaline phosphatase 72, AST of 25, but the ALT was about twice normal at 74.  Before any of the imaging studies or labs returned, Dr. Jonny Ruiz treated him empirically for a presumptive diagnosis of diverticulitis.  He was started on a 10-day course of twice daily Augmentin, however, the CT scan did not confirm diverticulitis, but the patient continued on his antibiotics.  A visit was arranged for him to come and see Dr. Victorino Dike, his usual GI doctor, on July 04, 2000, which is the day after this admission occurred, however, the patient had a couple of episodes of recurrence of his pain, one particularly bad on the morning of July 03, 2000.  This was associated with nausea, but only dry heaves.  The pain geneally has been controlled with Darvocet, and Phenergan has managed to alleviate the nausea, however, the patient had not been eating, felt very weak, and generally could not make it to his appointment with Dr. Victorino Dike. Therefore he came to the emergency room where Dr. Marina Goodell evaluated him, and admitted him for IV fluid hydration, and arranged for him to have an ERCP on the following day.  Studies in the emergency room included an acute abdominal series which did show several loops of moderately dilated small bowel at the left abdomen, questionably a localized ileus versus an early partial small bowel obstruction.  The chest films were unremarkable.  LABORATORY DATA:  Showed a normal white count at 8, hemoglobin 18.  Urinalysis was negative.  Full chemistries were remarkable only for an ALT of 67, total bilirubin, alkaline phosphatase, and AST were normal.  Amylase  and lipase were  normal.  Stool C. difficile positive.  White count from 8 up to 17.1, and down to 13.1.  Hemoglobin went from 18 to 16.1, down to 14.5, with MCV normal at 91.1.  Platelets ranged from 200 to 263.  Initial differential on the CBC was within normal limits.  When the white blood cell count was 17.1, there was a left shift present on repeat differential.  The PT was 12.9, INR 1.0, PTT 26. Sodium 135, potassium 4.3, BUN 22, creatinine 1.1.  Total bilirubin 1, alkaline phosphatase 61, AST 34, ALT 67.  Amylase 90, lipase 45.  Urinalysis was negative for nitrites, protein, leukocyte esterase.  No microscopic examination was performed.  Blood cultures were drawn on one occasion.  One site showed no growth at two days, though the final results are not in.  The second site revealed gram-positive cocci with organism identification pending.  IMAGING STUDIES:  The acute abdominal series is described above, but essentially showed  questionable localized small bowel ileus, versus partial small bowel obstruction, and no evidence for any chest or cardiac abnormalities.  HOSPITAL COURSE:  The patient was admitted on the afternoon of July 03, 2000.  In the emergency room he had been started on IV fluids.  These were continued, once he reached his room in 5500.  Diet was limited to clear liquids.  Available for pain control was either Darvocet and/or morphine IV. He had Phenergan available if needed for nausea.  Over the course of the first night the patient developed diarrhea.  This was not large volume diarrhea, but he had frequent visits to the bathroom, where he was passing flatus and small watery, nonbloody stools.  This was quite distressful.  Overnight his temperature reached a maximum of 102.4 degrees.  The patient had not shown any evidence for fevers or infection when he first came in, and his Augmentin had been discontinued, because it was not felt necessary.  Because of the  diarrhea, a stool for C. difficile was ordered.  This did come back positive on the first and only assay.  He initially got Unasyn before his ERCP.  Following the ERCP, with sphincterotomy and stone extraction, the patient was seen by Dr. Luan Pulling for a laparoscopic cholecystectomy consideration.  Despite his fevers, Dr. Luan Pulling elected to take the patient to surgery that same afternoon, the day of his ERCP.  A laparoscopic cholecystectomy was uneventful.  Following the surgery, Unasyn was continued, along with Cipro which Dr. Marina Goodell had started.  On the following morning the Clostridin difficle assay results were available.  Since this was positive, he was started on oral Metronidazole, and Cipro and Unasyn were discontinued. The patients maximum temperature was 103.3 degrees between the time of his ERCP and his surgery, however, following surgery he was afebrile and was stable.  His diarrhea subsided.  On the morning of July 05, 2000, his diet was advanced to a lactose-free, low-residue diet.  He tolerated this diet quite well.  He was watched for another 24 hours, and remained afebrile.  DISPOSITION:  On the morning of July 06, 2000, he was discharged to home in stable condition.  He is to complete a 14-day course of Metronidazole.  FOLLOWUP:  He has an appointment to see Dr. Marina Goodell back in the office on July 24, 2000.  Arrangements were to be made by Dr. Lillie Columbia office for a surgical followup.  LABORATORY DATA:  Revealed his white count had come down from a high of 17.1 on January 16th, to 13.1 on January 17th.  DISCHARGE MEDICATIONS: 1. Metronidazole 250 mg one p.o. q.i.d. for 12 days. 2. Depakote timed-release 500 mg one p.o. b.i.d. 3. Vicodin one to two p.o. q.4h. p.r.n. pain. 4. Phenergan p.r.n. nausea.  ACTIVITIES:  The patient is retired, but he was given a sheet by Dr. Luan Pulling, outlining restrictions of lifting no more than 10 pounds until Dr.  Luan Pulling saw him back in the office.  He would be able to resume driving within the next 24 hours.  INSTRUCTIONS:  He is to call Dr. Lillie Columbia office for any severe untoward pain, prolonged nausea or vomiting, or significant bleeding of the abdominal incisions.  DIET:  He is advised that he can eat a regular diet, but if his diarrhea recurred, he should avoid dairy profucts and avoid high-fiber foods. DD:  07/06/00 TD:  07/06/00 Job: [id number]
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
## 2010-11-04 NOTE — H&P (Signed)
[place]. [facility]  Patient:    Gary Atkins, Gary Atkins                    MRN: [id number] Adm. Date:  07/03/00 Attending:  Wilhemina Bonito. Eda Keys., M.D. [facility] Dictator:   Dianah Field, P.A.                         History and Physical  CHIEF COMPLAINT:  Mid-abdominal pain with nausea, vomiting and malaise.  HISTORY OF PRESENT ILLNESS:  The patient is a 70 year old white male with a known history of diverticulosis.  He has a history of seizures.  He is status post appendectomy.  Ten days ago, on a Sunday, he developed abdominal pain located just above the umbilicus, which was non radiating.  The pain was severe and comparable in intensity to that of kidney stones.  He did have emesis that day a couple of times, but this was not bloody or coffee-ground containing.  The pain lasted several hours and subsided somewhat by that evening, so he was able to sleep. The next day, Monday, he felt better.  However, on Tuesday the pain recurred; then it was not quite as intense.  He went to see Dr. Yancey Flemings in the office, who was covering for Dr. Alwyn Ren his primary care physician.  Labs were obtained and a CT scan was ordered.  The CT scan was performed that Thursday at [facility].  This showed mild dilatation of the intrahepatic ducts and the common bile duct was also dilated at 10 mm.  Dilatation occurred to the level of the ampulla.  No stones were seen.  In the head of the pancreas was a prominent, inhomogeneous density suggestive of pancreatic carcinoma.  ERCP was recommended for further evaluation.  The patient was set up to see Dr. Victorino Dike, his usual GI physician, in the office on July 04, 2000.  The patients LFTs were slightly abnormal in that chemistries revealed a total bilirubin of 1.4 with alpha-phosphatase of 72, which is normal; AST also normal at 25, but the ALT was about twice normal at 74.  When he was seen in the office that Wednesday, prior  to imaging studies as well as lab results returning, he was empirically started on a 10-day course of Augmentin.  He continues to take this medication.  Pain has persisted, albeit it has not reached its previous intensity.  It is controlled with a single dose of Darvocet as needed.  Nausea has been recurrent a couple of times, and this is generally well controlled with Phenergan which was prescribed by Dr. Marina Goodell.  This morning the patient had recurrent nausea and vomiting with some worsening of abdominal pain.  He is just generally feeling weak.  He has lost about seven pounds since last week, and felt that he was not going to be able to make it until his appointment tomorrow with Dr. Ulyess Mort.  He called Dr. Jackey Loge office and was advised to come over to the emergency room for evaluation and possible admission.  When he threw up today he threw up mostly phlegm, as he had not had significant p.o. intake in the last day or so.  He has been able to keep his medications down.  He describes his bowel movements as being looser and about three to four times a day, compared with formed stools about two times a day normally.  He has not  had any blood per rectum or melena.  PAST MEDICAL HISTORY: 1. Seizure disorder.  The patient has a seizure disorder, which began in    June 2000.  He had generalized partial complex seizures.  A correspondence    from his neurologist, Dr. Rica Mast of [facility], notes that the etiology of    these seizures is not known.  However, the patient and his wife say that    their understanding is that it is from a "leaking, enlarged blood vessel in    the head"; however, they deny that this is an aneurysm. 2. History of kidney stones. 3. History of compression fractures to the lower spine, occurring at the time    of initial seizure in June 2000. 4. Status post appendectomy. 5. Status post tonsillectomy and adenoidectomy. 6. Status post right parotid gland  surgery. 7. History of diverticulosis.  The patient had a colonoscopy in 1996.  This    revealed a benign ulcer at the ileocecal valve.  A biopsy of the area    showed normal mucosal elements.  Also found were diverticulosis, with    evidence for question of a prior diverticulitis.  ALLERGIES:  The patient was using DEMEROL at the time his seizures occurred, and it was felt that the Demerol had lowered his seizure threshold and may have been the direct cause of the seizures.  SOCIAL HISTORY:  The patient is a retired Scientist, research (physical sciences).  He lives with his wife.  They have one adult son.  He does not have a history of cigarette smoking/tobacco use.  Since the time of seizures two years ago, he has not had any alcohol and he denies any history of prior heavy use of alcohol.  FAMILY HISTORY:  Mother died from ovarian cancer.  Father died from emphysema, and a brother also probably has emphysema; they are both smokers.  No history of cancer of GI etiology.  CURRENT MEDICATIONS: 1. Augmentin 875 mg p.o. b.i.d. 2. Depakote timed release 500 mg p.o. b.i.d. 3. Darvocet-N 100 one p.o. p.r.n. 4. Phenergan 25 mg p.o. p.r.n.  REVIEW OF SYSTEMS:  Denies headaches, last seizures occurred in June 2000.  No double vision, no focal weakness.  He does feel lethargic and generally weak. Has had some minor sweats, but no rigors or chills.  Urinary review of systems is negative for any dysuria, hematuria; though the urine itself looks darker and more concentrated.  Has had low back pain ever since he had the compression fractures.  He wears a right hearing aid.  PHYSICAL EXAMINATION:  GENERAL:  The patient is a somewhat chronically ill-looking white male, who is comfortable.  He is a fairly good historian.  VITAL SIGNS:  Blood pressure 148/96, respirations 16, pulse 84, temperature 99 degrees Fahrenheit.  HEENT:  No scleral icterus.  No conjunctival pallor.  There is a sty evident in the left  lower lid.  Oropharynx and teeth are in good repair.  Oral mucosa is a bit dry; there are no oral lesions.  NECK:  No masses, no goiter, no JVD and no bruits.   CHEST:  Lungs are clear to auscultation and percussion bilaterally.  CORONARY:  There is regular rate and rhythm, with no murmurs, rubs or gallops.  ABDOMEN:  Soft, nondistended.  Bowel sounds are a bit hypoactive.  There is minimal tenderness at the region just above the umbilicus.  There is no guarding or rebound.  RECTAL:  No rectal masses.  Prostate not enlarged.  Stool  is brown and fecal occult blood negative.  There is no fecal impaction.  EXTREMITIES:  Dorsalis pedis pulses are 3+ bilaterally and vigorous.  There is no pedal or ankle edema.  NEUROLOGIC:  Patient is alert and oriented x 3.  His strength, as far as grip and in the foot with flexion and extension are 5/5 bilaterally.  No tremor evident.  CURRENT LABS:  A CMET, CBC, urinalysis and coags are all pending.  An acute abdominal series is also pending.  IMPRESSION: 1. 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  Rule out    pancreatic cancer, rule out partial gastric outlet obstruction. 2. Seizure disorder, etiology not known. 3. History of nephrolithiasis. 4. History of diverticulosis.  Presently there is no evidence either by    imaging or from recent CBC that this patient has diverticulitis.  He has    been treated empirically with Augmentin, which this medication was    continued up until the time of this admission.  I see no evidence for    continuing this medication.  PLANS: 1. The patient is to be admitted to Dr. Amedeo Kinsman service to a regular    bed.  Plan for ERCP study tomorrow. 2. Treat his pain with Darvocet-N 100, and if this is not effective use    morphine. 3. Plan to  continue IV fluids, which were begun in the emergency room. 4. Plan to follow up pending laboratories, as well as x-ray studies. DD:  07/03/00 TD:  07/03/00 Job: [id number]

## 2010-11-04 NOTE — Op Note (Signed)
[place]  Patient:    Gary Atkins, Gary Atkins                          MRN: [id number] Proc. Date: 07/03/00 Adm. Date:  [id number] Attending:  Estella Husk                           Operative Report  PREOPERATIVE DIAGNOSIS:  Cholelithiasis and choledocholithiasis.  POSTOPERATIVE DIAGNOSIS:  Cholelithiasis and choledocholithiasis.  OPERATION PERFORMED:  Laparoscopic cholecystectomy.  SURGEON:  Stephenie Acres, M.D.  ASSISTANT:    Angelia Mould. Derrell Lolling, M.D.  ANESTHESIA:  General.  CONSULTANTS:  Dr. Yancey Flemings.  DESCRIPTION OF PROCEDURE:  The patient was taken to the operating room and placed in supine position.  After adequate anesthesia was induced using endotracheal tube, the abdomen was prepped and draped in normal sterile fashion.  Using a vertical infraumbilical incision, I dissected down to the fascia.  The fascia was opened vertically.  The peritoneum was entered.  A 0 Vicryl pursestring suture was placed around the fascial defect.  A Hasson trocar was placed in the abdomen.  The abdomen was inflated with carbon dioxide.  Under direct visualization, a 10 mm port was placed in the subxiphoid region and two 5 mm ports were placed in the right abdomen.  The gallbladder was identified and retracted cephalad.  The gallbladder was dumbell shaped and dissection began down on the infundibulum of the gallbladder.  The cystic duct was dissected out.  Its junction with the gallbladder and common bile duct were easily identified.  The cystic duct was triply clipped and divided.  Both the anterior and posterior branches of the cystic artery were identified.  They were quite short and the hepatic artery could be seen.  Both branches of the anterior and posterior cystic artery were then triply divided.  The gallbladder was taken off the gallbladder bed using Bovie electrocautery and removed through the umbilical port.  Adequate hemostasis was  ensured.  The right upper quadrant was irrigated.  All trocars were removed.  The abdomen was allowed to deflate.  The fascial defect was closed with a 0 Vicryl pursestring suture.  All incisions were injected using Marcaine.  The skin incisions were closed with subcuticular 4-0 Vicryl. Steri-Strips and sterile dressings were applied.  The patient tolerated ll the procedure well and went to PACU in good condition. DD:  07/04/00 TD:  07/05/00 Job: [id number]

## 2010-11-07 ENCOUNTER — Ambulatory Visit: Payer: Medicare Other | Admitting: Cardiology

## 2010-11-10 ENCOUNTER — Encounter: Payer: Self-pay | Admitting: *Deleted

## 2010-11-15 ENCOUNTER — Ambulatory Visit: Payer: Medicare Other | Admitting: Cardiology

## 2010-11-16 ENCOUNTER — Ambulatory Visit (INDEPENDENT_AMBULATORY_CARE_PROVIDER_SITE_OTHER): Payer: Medicare Other | Admitting: Cardiology

## 2010-11-16 ENCOUNTER — Encounter: Payer: Self-pay | Admitting: Cardiology

## 2010-11-16 DIAGNOSIS — I2510 Atherosclerotic heart disease of native coronary artery without angina pectoris: Secondary | ICD-10-CM

## 2010-11-16 DIAGNOSIS — I4949 Other premature depolarization: Secondary | ICD-10-CM

## 2010-11-16 DIAGNOSIS — I493 Ventricular premature depolarization: Secondary | ICD-10-CM

## 2010-11-16 DIAGNOSIS — I428 Other cardiomyopathies: Secondary | ICD-10-CM

## 2010-11-16 DIAGNOSIS — I5032 Chronic diastolic (congestive) heart failure: Secondary | ICD-10-CM

## 2010-11-16 DIAGNOSIS — I509 Heart failure, unspecified: Secondary | ICD-10-CM

## 2010-11-16 MED ORDER — AMOXICILLIN 875 MG PO TABS: 875.0000 mg | ORAL_TABLET | Freq: Two times a day (BID) | ORAL | Status: AC

## 2010-11-16 NOTE — Patient Instructions (Signed)
Your physician has recommended you make the following change in your medication:  1) Start amoxicillin 875mg  one tablet twice daily for 7 days.  Your physician recommends that you return for FASTING lab work on a day that is good for you: lipid/liver/bmet (428.32;414.01)  Your physician has requested that you have an echocardiogram in September 2012. Echocardiography is a painless test that uses sound waves to create images of your heart. It provides your doctor with information about the size and shape of your heart and how well your heart's chambers and valves are working. This procedure takes approximately one hour. There are no restrictions for this procedure.  Your physician wants you to follow-up in: 6 months. You will receive a reminder letter in the mail two months in advance. If you don't receive a letter, please call our office to schedule the follow-up appointment.

## 2010-11-17 ENCOUNTER — Other Ambulatory Visit (INDEPENDENT_AMBULATORY_CARE_PROVIDER_SITE_OTHER): Payer: Medicare Other | Admitting: *Deleted

## 2010-11-17 DIAGNOSIS — I4949 Other premature depolarization: Secondary | ICD-10-CM

## 2010-11-17 DIAGNOSIS — I5032 Chronic diastolic (congestive) heart failure: Secondary | ICD-10-CM

## 2010-11-17 DIAGNOSIS — I509 Heart failure, unspecified: Secondary | ICD-10-CM

## 2010-11-17 DIAGNOSIS — I2510 Atherosclerotic heart disease of native coronary artery without angina pectoris: Secondary | ICD-10-CM

## 2010-11-17 DIAGNOSIS — I493 Ventricular premature depolarization: Secondary | ICD-10-CM

## 2010-11-17 LAB — HEPATIC FUNCTION PANEL
ALT: 34 U/L (ref 0–53)
Albumin: 3.7 g/dL (ref 3.5–5.2)
Total Bilirubin: 1.4 mg/dL — ABNORMAL HIGH (ref 0.3–1.2)
Total Protein: 6.8 g/dL (ref 6.0–8.3)

## 2010-11-17 LAB — LIPID PANEL
Cholesterol: 121 mg/dL (ref 0–200)
HDL: 34.6 mg/dL — ABNORMAL LOW (ref >39.00)
Triglycerides: 96 mg/dL (ref 0.0–149.0)
VLDL: 19.2 mg/dL (ref 0.0–40.0)

## 2010-11-17 LAB — BASIC METABOLIC PANEL
GFR: 66.5 mL/min (ref >60.00)
Potassium: 4.1 mEq/L (ref 3.5–5.1)
Sodium: 140 mEq/L (ref 135–145)

## 2010-11-18 ENCOUNTER — Telehealth: Payer: Self-pay | Admitting: *Deleted

## 2010-11-18 NOTE — Telephone Encounter (Signed)
LMTCB--nt 

## 2010-11-18 NOTE — Assessment & Plan Note (Signed)
Stable, no chest pain.  Continue ASA, statin, ACEI, beta blocker.  Goal LDL < 70.  Need to repeat lipids/LFTs.   Patient has had mouth pain behind his molars on the right.  It is mildly erythematous on exam.  I will give him an amoxicillin prescription for possible dental infection and encourage him to followup with his dentist.
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
## 2010-11-18 NOTE — Assessment & Plan Note (Addendum)
Mild to moderate CAD on cath did not explain cardiomyopathy.  LV EF 30-35% on initial echo, then 44% with moderate global RV hypokinesis on cardiac MRI.  No evidence on MRI for infiltrative disease or prior infarction.  He did not fulfill the criteria for ARVC.  I suspect that his cardiomyopathy may have been due to frequent PVCs (21.6% of total beats prior to ablation).  Since ablation, PVC burden has decreased to 2.4% and he is feeling better.  Repeat echo in 9/11 showed improvement in function, EF up to 50-55%.   - Continue lisinopril and beta blocker.  - Repeat echo in 9/12 to make sure that EF remains close to normal.

## 2010-12-09 ENCOUNTER — Other Ambulatory Visit: Payer: Self-pay | Admitting: Internal Medicine

## 2010-12-09 ENCOUNTER — Other Ambulatory Visit (INDEPENDENT_AMBULATORY_CARE_PROVIDER_SITE_OTHER): Payer: Medicare Other

## 2010-12-09 DIAGNOSIS — Z0000 Encounter for general adult medical examination without abnormal findings: Secondary | ICD-10-CM

## 2010-12-09 DIAGNOSIS — Z1289 Encounter for screening for malignant neoplasm of other sites: Secondary | ICD-10-CM

## 2010-12-09 DIAGNOSIS — Z79899 Other long term (current) drug therapy: Secondary | ICD-10-CM

## 2010-12-09 DIAGNOSIS — Z125 Encounter for screening for malignant neoplasm of prostate: Secondary | ICD-10-CM

## 2010-12-09 DIAGNOSIS — E785 Hyperlipidemia, unspecified: Secondary | ICD-10-CM

## 2010-12-09 DIAGNOSIS — M545 Low back pain: Secondary | ICD-10-CM

## 2010-12-09 LAB — BASIC METABOLIC PANEL
BUN: 24 mg/dL — ABNORMAL HIGH (ref 6–23)
Calcium: 9.2 mg/dL (ref 8.4–10.5)
Creatinine, Ser: 1.1 mg/dL (ref 0.4–1.5)
GFR: 73.05 mL/min (ref >60.00)
Glucose, Bld: 87 mg/dL (ref 70–99)

## 2010-12-09 LAB — CBC WITH DIFFERENTIAL/PLATELET
Basophils Absolute: 0 10*3/uL (ref 0.0–0.1)
Eosinophils Relative: 9.6 % — ABNORMAL HIGH (ref 0.0–5.0)
HCT: 47 % (ref 39.0–52.0)
Hemoglobin: 16 g/dL (ref 13.0–17.0)
Lymphs Abs: 1.8 10*3/uL (ref 0.7–4.0)
MCV: 95.3 fl (ref 78.0–100.0)
Monocytes Absolute: 0.5 10*3/uL (ref 0.1–1.0)
Monocytes Relative: 10.3 % (ref 3.0–12.0)
Neutro Abs: 2.4 10*3/uL (ref 1.4–7.7)
RDW: 13.5 % (ref 11.5–14.6)

## 2010-12-09 LAB — LIPID PANEL: Cholesterol: 117 mg/dL (ref 0–200)

## 2010-12-09 LAB — HEPATIC FUNCTION PANEL
AST: 31 U/L (ref 0–37)
Alkaline Phosphatase: 32 U/L — ABNORMAL LOW (ref 39–117)
Bilirubin, Direct: 0.2 mg/dL (ref 0.0–0.3)
Total Bilirubin: 0.9 mg/dL (ref 0.3–1.2)

## 2010-12-09 LAB — TSH: TSH: 2.74 u[IU]/mL (ref 0.35–5.50)

## 2010-12-09 LAB — URINALYSIS
Hgb urine dipstick: NEGATIVE
Nitrite: NEGATIVE
Specific Gravity, Urine: >=1.03 (ref 1.000–1.030)
pH: 6 (ref 5.0–8.0)

## 2010-12-15 ENCOUNTER — Ambulatory Visit (INDEPENDENT_AMBULATORY_CARE_PROVIDER_SITE_OTHER): Payer: Medicare Other | Admitting: Internal Medicine

## 2010-12-15 ENCOUNTER — Encounter: Payer: Self-pay | Admitting: Internal Medicine

## 2010-12-15 VITALS — BP 94/60 | HR 59 | Temp 97.4°F | Ht 69.0 in | Wt 189.1 lb

## 2010-12-15 DIAGNOSIS — Z0001 Encounter for general adult medical examination with abnormal findings: Secondary | ICD-10-CM | POA: Insufficient documentation

## 2010-12-15 DIAGNOSIS — Z0000 Encounter for general adult medical examination without abnormal findings: Secondary | ICD-10-CM | POA: Insufficient documentation

## 2010-12-15 DIAGNOSIS — M549 Dorsalgia, unspecified: Secondary | ICD-10-CM

## 2010-12-15 MED ORDER — OMEPRAZOLE 20 MG PO CPDR: 20.0000 mg | DELAYED_RELEASE_CAPSULE | Freq: Every day | ORAL | Status: DC

## 2010-12-15 NOTE — Assessment & Plan Note (Signed)
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
## 2010-12-15 NOTE — Patient Instructions (Addendum)
Continue all other medications as before You are otherwise up to date with prevention Please keep your appointments with your specialists as you have planned - Neurology in [facility] as you do, and cardiology You will be contacted regarding the referral for: orthopedic (Dr Charlann Boxer) Please return in 1 year for your yearly visit, or sooner if needed, with Lab testing done 3-5 days before

## 2011-03-01 ENCOUNTER — Ambulatory Visit (HOSPITAL_COMMUNITY): Payer: Medicare Other | Attending: Cardiology | Admitting: Radiology

## 2011-03-01 DIAGNOSIS — I2510 Atherosclerotic heart disease of native coronary artery without angina pectoris: Secondary | ICD-10-CM

## 2011-03-01 DIAGNOSIS — R0609 Other forms of dyspnea: Secondary | ICD-10-CM | POA: Insufficient documentation

## 2011-03-01 DIAGNOSIS — I493 Ventricular premature depolarization: Secondary | ICD-10-CM

## 2011-03-01 DIAGNOSIS — I509 Heart failure, unspecified: Secondary | ICD-10-CM

## 2011-03-01 DIAGNOSIS — I5032 Chronic diastolic (congestive) heart failure: Secondary | ICD-10-CM

## 2011-03-01 DIAGNOSIS — R0989 Other specified symptoms and signs involving the circulatory and respiratory systems: Secondary | ICD-10-CM | POA: Insufficient documentation

## 2011-03-03 ENCOUNTER — Telehealth: Payer: Self-pay | Admitting: *Deleted

## 2011-03-03 NOTE — Telephone Encounter (Signed)
That would be fine.  He can take Viagra 50.

## 2011-03-03 NOTE — Telephone Encounter (Signed)
I called to to give him recent echo results. Pt asked if Dr Shirlee Latch will prescribe viagra for him. Pt states he is not having any chest pain and does not use NTG. I will forward to Dr Shirlee Latch for review.

## 2011-03-07 MED ORDER — SILDENAFIL CITRATE 50 MG PO TABS: 50.0000 mg | ORAL_TABLET | Freq: Every day | ORAL | Status: DC | PRN

## 2011-03-07 NOTE — Telephone Encounter (Signed)
LMTCB

## 2011-03-07 NOTE — Telephone Encounter (Signed)
I talked with pt. He is aware not to use NTG within 24 hours of using Viagra.

## 2011-03-08 LAB — DIFFERENTIAL
Basophils Absolute: 0
Basophils Relative: 0
Lymphocytes Relative: 17
Monocytes Absolute: 0.8
Neutro Abs: 6.5
Neutrophils Relative %: 70

## 2011-03-08 LAB — CBC
HCT: 29.7 — ABNORMAL LOW
Hemoglobin: 10.1 — ABNORMAL LOW
MCV: 91.5
MCV: 92.7
Platelets: 326
Platelets: 482 — ABNORMAL HIGH
RBC: 3.21 — ABNORMAL LOW
RBC: 3.37 — ABNORMAL LOW
WBC: 8.6
WBC: 9.3

## 2011-03-08 LAB — BASIC METABOLIC PANEL
BUN: 20
Chloride: 102
Creatinine, Ser: 0.84
GFR calc Af Amer: >60
GFR calc non Af Amer: >60

## 2011-03-08 LAB — COMPREHENSIVE METABOLIC PANEL
Albumin: 2.1 — ABNORMAL LOW
Alkaline Phosphatase: 49
BUN: 19
CO2: 28
Chloride: 102
Creatinine, Ser: 0.99
GFR calc non Af Amer: >60
Glucose, Bld: 99
Potassium: 3.7
Total Bilirubin: 1.5 — ABNORMAL HIGH

## 2011-04-04 IMAGING — CR DG CHEST 2V
2 series · 2 of 2 positions shown · non-contrast
Comparison: None

CLINICAL DATA: Dyspnea on exertion

CHEST - 2 VIEW

[view not recorded (1 of 2)]
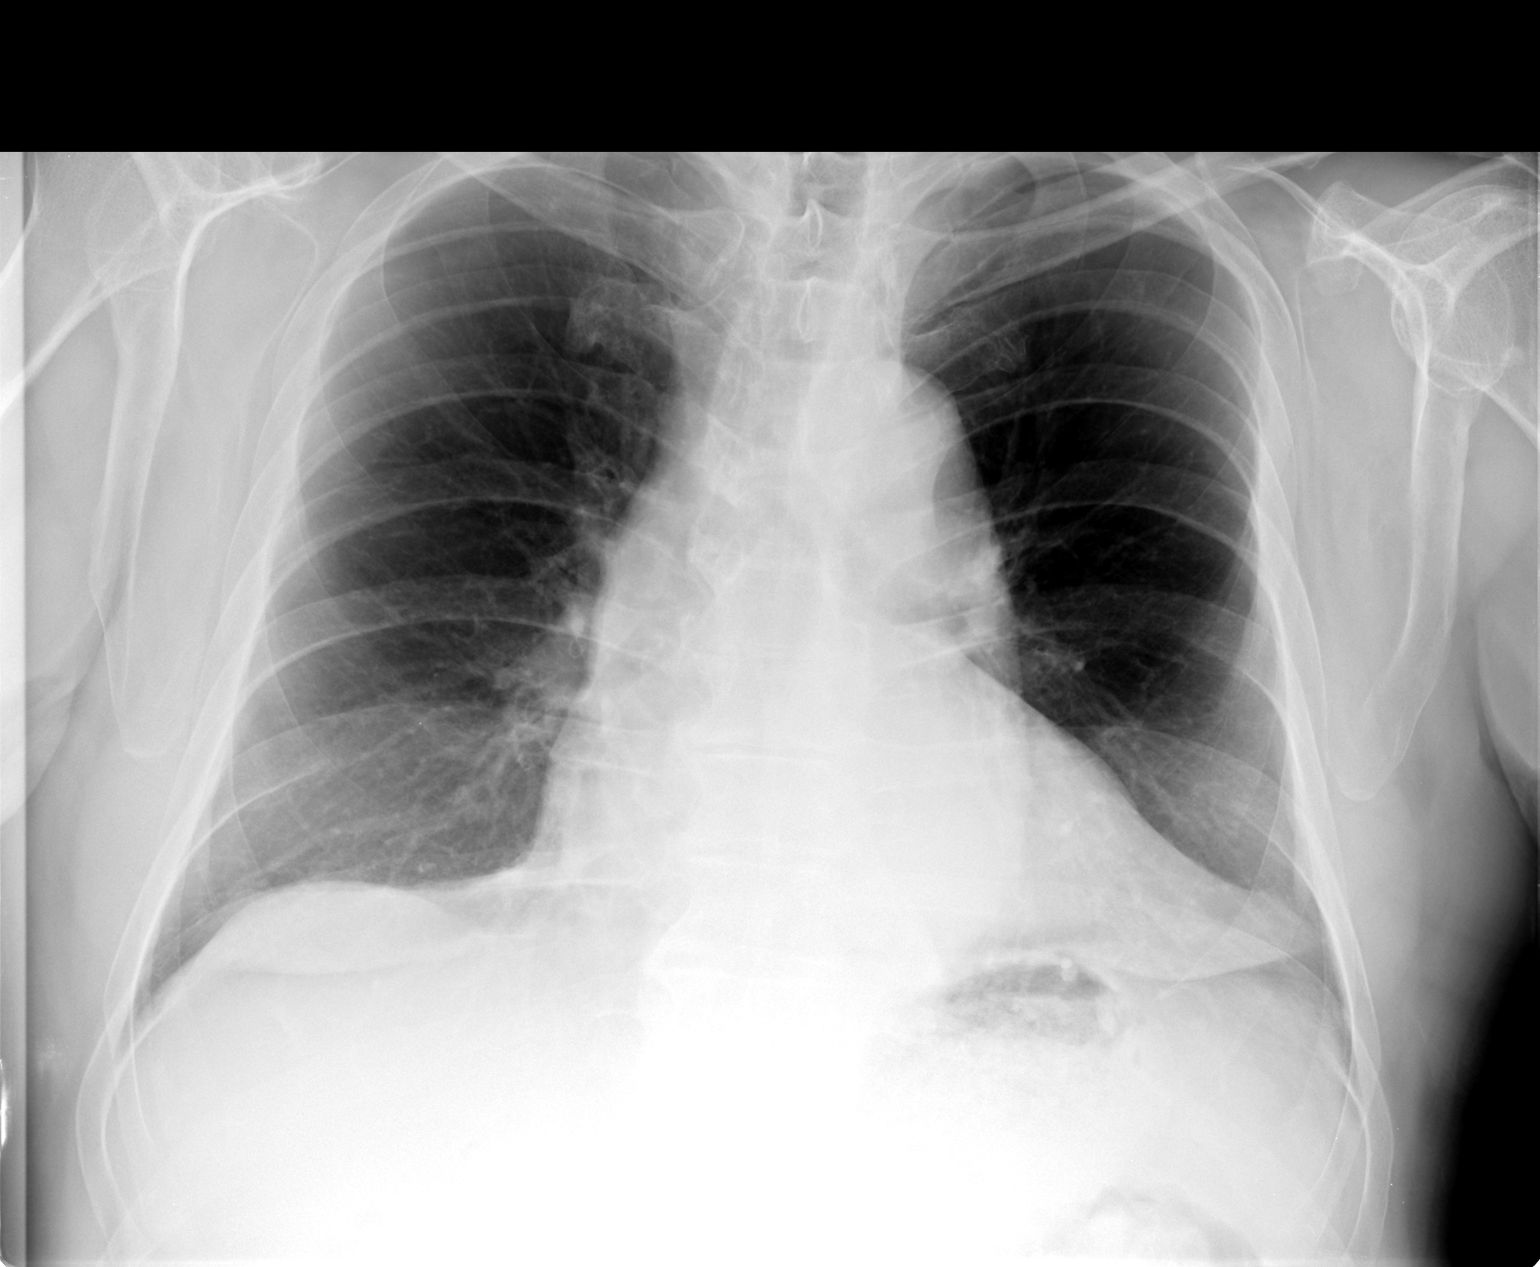

[view not recorded (2 of 2)]
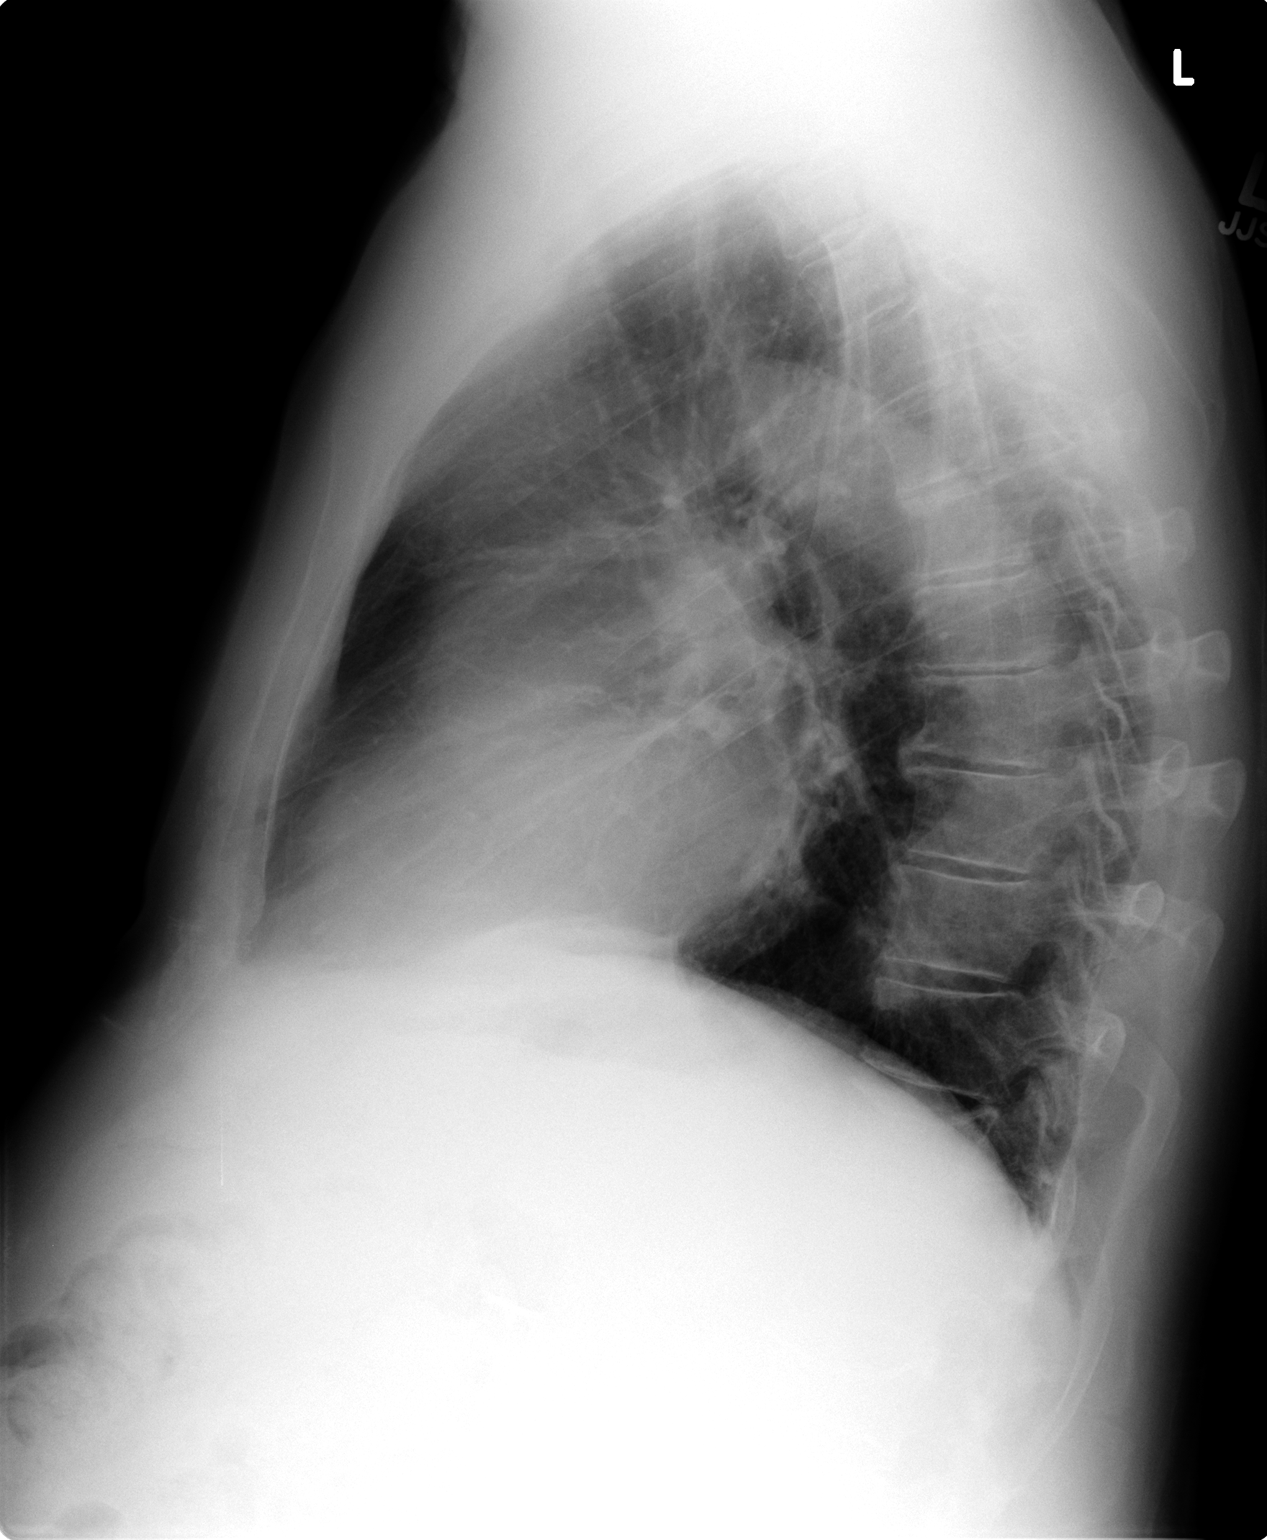

[2 of 2 positions shown; findings below may reference images not displayed]

FINDINGS: Tortuous ectatic thoracic aorta.  Heart size normal.  No
pulmonary vascular congestion.  No active lung process.
Gynecomastia.
IMPRESSION: No active chest disease.  Ectatic aorta.

## 2011-05-30 ENCOUNTER — Encounter (HOSPITAL_COMMUNITY): Payer: Self-pay

## 2011-05-30 ENCOUNTER — Emergency Department (HOSPITAL_COMMUNITY): Payer: Medicare Other

## 2011-05-30 ENCOUNTER — Emergency Department (HOSPITAL_COMMUNITY)
Admission: EM | Admit: 2011-05-30 | Discharge: 2011-05-30 | Disposition: A | Discharge status: Home / Self Care | Payer: Medicare Other | Attending: Emergency Medicine | Admitting: Emergency Medicine

## 2011-05-30 DIAGNOSIS — S82209A Unspecified fracture of shaft of unspecified tibia, initial encounter for closed fracture: Secondary | ICD-10-CM

## 2011-05-30 DIAGNOSIS — M810 Age-related osteoporosis without current pathological fracture: Secondary | ICD-10-CM | POA: Insufficient documentation

## 2011-05-30 DIAGNOSIS — G40909 Epilepsy, unspecified, not intractable, without status epilepticus: Secondary | ICD-10-CM | POA: Insufficient documentation

## 2011-05-30 DIAGNOSIS — E785 Hyperlipidemia, unspecified: Secondary | ICD-10-CM | POA: Insufficient documentation

## 2011-05-30 DIAGNOSIS — W19XXXA Unspecified fall, initial encounter: Secondary | ICD-10-CM | POA: Insufficient documentation

## 2011-05-30 DIAGNOSIS — K219 Gastro-esophageal reflux disease without esophagitis: Secondary | ICD-10-CM | POA: Insufficient documentation

## 2011-05-30 DIAGNOSIS — M79609 Pain in unspecified limb: Secondary | ICD-10-CM | POA: Insufficient documentation

## 2011-05-30 DIAGNOSIS — M25569 Pain in unspecified knee: Secondary | ICD-10-CM | POA: Insufficient documentation

## 2011-05-30 DIAGNOSIS — Z9889 Other specified postprocedural states: Secondary | ICD-10-CM | POA: Insufficient documentation

## 2011-05-30 DIAGNOSIS — S82109A Unspecified fracture of upper end of unspecified tibia, initial encounter for closed fracture: Secondary | ICD-10-CM | POA: Insufficient documentation

## 2011-05-30 MED ORDER — FENTANYL CITRATE 0.05 MG/ML IJ SOLN
INTRAMUSCULAR | Status: AC
  Administered 2011-05-30: 50 ug
  Filled 2011-05-30: qty 2

## 2011-05-30 MED ORDER — ONDANSETRON HCL 4 MG/2ML IJ SOLN
INTRAMUSCULAR | Status: AC
  Administered 2011-05-30: 4 mg
  Filled 2011-05-30: qty 2

## 2011-05-30 MED ORDER — HYDROCODONE-ACETAMINOPHEN 5-325 MG PO TABS: 1.0000 | ORAL_TABLET | Freq: Four times a day (QID) | ORAL | Status: AC | PRN

## 2011-05-30 NOTE — Progress Notes (Signed)
Orthopedic Tech Progress Note Patient Details:  Gary Atkins [birth year] [id number]  Other Ortho Devices Type of Ortho Device: Knee Immobilizer Ortho Device Location: (L) LE Ortho Device Interventions: Application   Jennye Moccasin 05/30/2011, 4:22 PM

## 2011-05-30 NOTE — ED Notes (Signed)
Knee immobilizer applied per ortho tech.

## 2011-05-30 NOTE — ED Notes (Signed)
[place] close to [place] area he had a fall after tripping on a stick. He landed on the left knee. Since then he has been unable to put any weight on it since the fall. He just returned last night. Pt has swelling and severe pain to the left knee/leg area. No meds pta. Pedals present. Decreased rom.
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
## 2011-06-10 ENCOUNTER — Other Ambulatory Visit: Payer: Self-pay | Admitting: Cardiology

## 2011-08-10 ENCOUNTER — Other Ambulatory Visit: Payer: Self-pay | Admitting: Cardiology

## 2011-08-10 ENCOUNTER — Other Ambulatory Visit: Payer: Self-pay

## 2011-08-10 MED ORDER — LISINOPRIL 2.5 MG PO TABS: 2.5000 mg | ORAL_TABLET | Freq: Every day | ORAL | Status: DC

## 2011-08-11 ENCOUNTER — Other Ambulatory Visit: Payer: Self-pay | Admitting: *Deleted

## 2011-08-11 MED ORDER — ATENOLOL 25 MG PO TABS: 25.0000 mg | ORAL_TABLET | Freq: Every day | ORAL | Status: DC

## 2011-08-14 ENCOUNTER — Other Ambulatory Visit: Payer: Self-pay | Admitting: *Deleted

## 2011-08-15 ENCOUNTER — Other Ambulatory Visit: Payer: Self-pay | Admitting: Internal Medicine

## 2011-08-15 MED ORDER — ATENOLOL 25 MG PO TABS: 25.0000 mg | ORAL_TABLET | Freq: Every day | ORAL | Status: DC

## 2011-08-15 NOTE — Telephone Encounter (Signed)
Pt called back and [facility] said they have it please disregard

## 2011-08-15 NOTE — Telephone Encounter (Signed)
Please resend to [facility] on [address] and let him know

## 2011-10-13 ENCOUNTER — Ambulatory Visit (INDEPENDENT_AMBULATORY_CARE_PROVIDER_SITE_OTHER): Payer: 59 | Admitting: Psychology

## 2011-10-13 DIAGNOSIS — F4321 Adjustment disorder with depressed mood: Secondary | ICD-10-CM

## 2011-11-06 ENCOUNTER — Ambulatory Visit (INDEPENDENT_AMBULATORY_CARE_PROVIDER_SITE_OTHER): Payer: 59 | Admitting: Psychology

## 2011-11-06 DIAGNOSIS — F4321 Adjustment disorder with depressed mood: Secondary | ICD-10-CM

## 2011-11-13 ENCOUNTER — Other Ambulatory Visit: Payer: Self-pay | Admitting: Cardiology

## 2011-11-14 ENCOUNTER — Ambulatory Visit (INDEPENDENT_AMBULATORY_CARE_PROVIDER_SITE_OTHER): Payer: 59 | Admitting: Psychology

## 2011-11-14 DIAGNOSIS — F4321 Adjustment disorder with depressed mood: Secondary | ICD-10-CM

## 2011-11-22 ENCOUNTER — Ambulatory Visit (INDEPENDENT_AMBULATORY_CARE_PROVIDER_SITE_OTHER): Payer: 59 | Admitting: Psychology

## 2011-11-22 DIAGNOSIS — F4321 Adjustment disorder with depressed mood: Secondary | ICD-10-CM

## 2011-11-24 DIAGNOSIS — K5730 Diverticulosis of large intestine without perforation or abscess without bleeding: Secondary | ICD-10-CM | POA: Insufficient documentation

## 2011-11-24 DIAGNOSIS — E785 Hyperlipidemia, unspecified: Secondary | ICD-10-CM | POA: Insufficient documentation

## 2011-11-24 DIAGNOSIS — A0472 Enterocolitis due to Clostridium difficile, not specified as recurrent: Secondary | ICD-10-CM | POA: Insufficient documentation

## 2011-11-24 DIAGNOSIS — K219 Gastro-esophageal reflux disease without esophagitis: Secondary | ICD-10-CM | POA: Insufficient documentation

## 2011-11-27 ENCOUNTER — Encounter: Payer: Self-pay | Admitting: Internal Medicine

## 2011-11-27 ENCOUNTER — Ambulatory Visit (INDEPENDENT_AMBULATORY_CARE_PROVIDER_SITE_OTHER): Payer: 59 | Admitting: Internal Medicine

## 2011-11-27 VITALS — BP 112/80 | HR 63 | Temp 98.2°F | Resp 14 | Wt 194.2 lb

## 2011-11-27 DIAGNOSIS — M810 Age-related osteoporosis without current pathological fracture: Secondary | ICD-10-CM

## 2011-11-27 DIAGNOSIS — Z23 Encounter for immunization: Secondary | ICD-10-CM

## 2011-11-27 DIAGNOSIS — E785 Hyperlipidemia, unspecified: Secondary | ICD-10-CM

## 2011-11-27 DIAGNOSIS — Z789 Other specified health status: Secondary | ICD-10-CM | POA: Insufficient documentation
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
## 2011-11-27 NOTE — Patient Instructions (Addendum)
Please check with your pharmacy about the actonel;  Ok to stop if you are taking more than 5 yrs You had the third Twinrx today Continue all other medications as before Please have the pharmacy call with any refills you may need.

## 2011-11-27 NOTE — Assessment & Plan Note (Signed)
For twinrx #3 today as he is due,  to f/u any worsening symptoms or concerns

## 2011-11-27 NOTE — Assessment & Plan Note (Signed)
Pt to look into pharmacy record;  If has taken actonel > 5 yrs , he should stop, will need to re-address with f/u dxa as well

## 2011-11-27 NOTE — Assessment & Plan Note (Signed)
stable overall by hx and exam, most recent data reviewed with pt, and pt to continue medical treatment as before Lab Results  Component Value Date   LDLCALC 69 12/09/2010

## 2011-12-05 ENCOUNTER — Ambulatory Visit (INDEPENDENT_AMBULATORY_CARE_PROVIDER_SITE_OTHER): Payer: 59 | Admitting: Psychology

## 2011-12-05 DIAGNOSIS — F4321 Adjustment disorder with depressed mood: Secondary | ICD-10-CM

## 2011-12-18 ENCOUNTER — Ambulatory Visit (INDEPENDENT_AMBULATORY_CARE_PROVIDER_SITE_OTHER): Payer: 59 | Admitting: Psychology

## 2011-12-18 DIAGNOSIS — F4321 Adjustment disorder with depressed mood: Secondary | ICD-10-CM

## 2012-01-29 ENCOUNTER — Ambulatory Visit (INDEPENDENT_AMBULATORY_CARE_PROVIDER_SITE_OTHER): Payer: 59 | Admitting: Internal Medicine

## 2012-01-29 ENCOUNTER — Other Ambulatory Visit (INDEPENDENT_AMBULATORY_CARE_PROVIDER_SITE_OTHER): Payer: Medicare Other

## 2012-01-29 ENCOUNTER — Encounter: Payer: Self-pay | Admitting: Internal Medicine

## 2012-01-29 VITALS — BP 102/70 | HR 64 | Temp 97.0°F | Ht 68.0 in | Wt 195.5 lb

## 2012-01-29 DIAGNOSIS — Z0000 Encounter for general adult medical examination without abnormal findings: Secondary | ICD-10-CM

## 2012-01-29 DIAGNOSIS — Z789 Other specified health status: Secondary | ICD-10-CM

## 2012-01-29 DIAGNOSIS — I10 Essential (primary) hypertension: Secondary | ICD-10-CM

## 2012-01-29 DIAGNOSIS — E785 Hyperlipidemia, unspecified: Secondary | ICD-10-CM

## 2012-01-29 LAB — CBC WITH DIFFERENTIAL/PLATELET
Basophils Absolute: 0.1 10*3/uL (ref 0.0–0.1)
Eosinophils Absolute: 0.3 10*3/uL (ref 0.0–0.7)
Hemoglobin: 16 g/dL (ref 13.0–17.0)
Lymphocytes Relative: 33 % (ref 12.0–46.0)
MCHC: 33.6 g/dL (ref 30.0–36.0)
Monocytes Absolute: 0.8 10*3/uL (ref 0.1–1.0)
Neutro Abs: 3.4 10*3/uL (ref 1.4–7.7)
RDW: 14.1 % (ref 11.5–14.6)

## 2012-01-29 LAB — URINALYSIS, ROUTINE W REFLEX MICROSCOPIC
Bilirubin Urine: NEGATIVE
Hgb urine dipstick: NEGATIVE
Ketones, ur: NEGATIVE
Urine Glucose: NEGATIVE
Urobilinogen, UA: 1 (ref 0.0–1.0)

## 2012-01-29 LAB — BASIC METABOLIC PANEL
CO2: 26 mEq/L (ref 19–32)
Calcium: 9.3 mg/dL (ref 8.4–10.5)
Chloride: 103 mEq/L (ref 96–112)
Sodium: 138 mEq/L (ref 135–145)

## 2012-01-29 LAB — HEPATIC FUNCTION PANEL
ALT: 30 U/L (ref 0–53)
Alkaline Phosphatase: 29 U/L — ABNORMAL LOW (ref 39–117)
Bilirubin, Direct: 0.2 mg/dL (ref 0.0–0.3)
Total Protein: 7 g/dL (ref 6.0–8.3)

## 2012-01-29 LAB — LIPID PANEL: Total CHOL/HDL Ratio: 4

## 2012-01-29 NOTE — Assessment & Plan Note (Signed)
Signed form  - ok for guided trip to [place]

## 2012-01-29 NOTE — Assessment & Plan Note (Signed)

## 2012-01-29 NOTE — Patient Instructions (Addendum)
Continue all other medications as before Please have the pharmacy call with any refills you may need. Please keep your appointments with your specialists as you have planned Please go to LAB in the Basement for the blood and/or urine tests to be done today You will be contacted by phone if any changes need to be made immediately.  Otherwise, you will receive a letter about your results with an explanation. Your form is filled out Have a Good Time on the [place] trip Please return in 1 year for your yearly visit, or sooner if needed, with Lab testing done 3-5 days before
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
## 2012-05-28 ENCOUNTER — Ambulatory Visit (INDEPENDENT_AMBULATORY_CARE_PROVIDER_SITE_OTHER): Payer: Medicare Other

## 2012-05-28 DIAGNOSIS — Z23 Encounter for immunization: Secondary | ICD-10-CM

## 2012-05-30 ENCOUNTER — Other Ambulatory Visit: Payer: Self-pay | Admitting: Cardiology

## 2012-06-04 ENCOUNTER — Other Ambulatory Visit: Payer: Self-pay | Admitting: *Deleted

## 2012-06-04 ENCOUNTER — Telehealth: Payer: Self-pay | Admitting: Cardiovascular Disease

## 2012-06-04 MED ORDER — SIMVASTATIN 40 MG PO TABS: 40.0000 mg | ORAL_TABLET | Freq: Every day | ORAL | Status: DC

## 2012-06-04 NOTE — Telephone Encounter (Signed)
Walk In pt Form " Pt Needs RX to be called In" Sent to [facility] 06/04/12/KM

## 2012-06-10 ENCOUNTER — Other Ambulatory Visit: Payer: Self-pay | Admitting: Cardiology

## 2012-06-28 ENCOUNTER — Ambulatory Visit: Payer: Medicare Other | Admitting: Cardiovascular Disease

## 2012-06-28 ENCOUNTER — Encounter: Payer: Self-pay | Admitting: Cardiology

## 2012-06-28 ENCOUNTER — Ambulatory Visit (INDEPENDENT_AMBULATORY_CARE_PROVIDER_SITE_OTHER): Payer: Medicare Other | Admitting: Cardiology

## 2012-06-28 VITALS — BP 122/84 | HR 64 | Ht 68.0 in | Wt 195.0 lb

## 2012-06-28 DIAGNOSIS — I2510 Atherosclerotic heart disease of native coronary artery without angina pectoris: Secondary | ICD-10-CM

## 2012-06-28 DIAGNOSIS — I428 Other cardiomyopathies: Secondary | ICD-10-CM

## 2012-06-28 MED ORDER — ATENOLOL 25 MG PO TABS: 25.0000 mg | ORAL_TABLET | Freq: Every day | ORAL | Status: DC

## 2012-06-28 NOTE — Patient Instructions (Addendum)
Your physician wants you to follow-up in: 1 year with Dr McLean. (January 2015).  You will receive a reminder letter in the mail two months in advance. If you don't receive a letter, please call our office to schedule the follow-up appointment.  
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
## 2012-07-01 ENCOUNTER — Other Ambulatory Visit: Payer: Self-pay | Admitting: Cardiology

## 2012-12-12 ENCOUNTER — Telehealth: Payer: Self-pay | Admitting: *Deleted

## 2012-12-12 MED ORDER — SILDENAFIL CITRATE 100 MG PO TABS: 50.0000 mg | ORAL_TABLET | Freq: Every day | ORAL | Status: DC | PRN

## 2012-12-12 NOTE — Telephone Encounter (Signed)
Pt called requesting Rx for Viagra, medication not on medication list.  Pts last office visit was 8.12.2013.  Please advise

## 2012-12-12 NOTE — Telephone Encounter (Signed)
Done erx 

## 2012-12-14 ENCOUNTER — Other Ambulatory Visit: Payer: Self-pay | Admitting: Cardiology

## 2012-12-17 NOTE — Telephone Encounter (Signed)
Refill done by Dr Jonny Ruiz.

## 2013-01-02 ENCOUNTER — Ambulatory Visit (INDEPENDENT_AMBULATORY_CARE_PROVIDER_SITE_OTHER): Payer: Medicare Other | Admitting: Internal Medicine

## 2013-01-02 ENCOUNTER — Other Ambulatory Visit (INDEPENDENT_AMBULATORY_CARE_PROVIDER_SITE_OTHER): Payer: Medicare Other

## 2013-01-02 ENCOUNTER — Encounter: Payer: Self-pay | Admitting: Internal Medicine

## 2013-01-02 VITALS — BP 110/82 | HR 57 | Temp 96.9°F | Wt 196.1 lb

## 2013-01-02 DIAGNOSIS — R6883 Chills (without fever): Secondary | ICD-10-CM

## 2013-01-02 DIAGNOSIS — M549 Dorsalgia, unspecified: Secondary | ICD-10-CM

## 2013-01-02 DIAGNOSIS — F329 Major depressive disorder, single episode, unspecified: Secondary | ICD-10-CM

## 2013-01-02 DIAGNOSIS — Z0000 Encounter for general adult medical examination without abnormal findings: Secondary | ICD-10-CM

## 2013-01-02 DIAGNOSIS — F32A Depression, unspecified: Secondary | ICD-10-CM

## 2013-01-02 LAB — URINALYSIS, ROUTINE W REFLEX MICROSCOPIC
Specific Gravity, Urine: >=1.03 (ref 1.000–1.030)
Urine Glucose: NEGATIVE
Urobilinogen, UA: 1 (ref 0.0–1.0)

## 2013-01-02 LAB — CBC WITH DIFFERENTIAL/PLATELET
Basophils Relative: 0.6 % (ref 0.0–3.0)
Eosinophils Absolute: 0.2 10*3/uL (ref 0.0–0.7)
Hemoglobin: 15.7 g/dL (ref 13.0–17.0)
Lymphocytes Relative: 28.6 % (ref 12.0–46.0)
MCHC: 33.7 g/dL (ref 30.0–36.0)
Monocytes Relative: 18.2 % — ABNORMAL HIGH (ref 3.0–12.0)
Neutro Abs: 4.1 10*3/uL (ref 1.4–7.7)
RBC: 4.82 Mil/uL (ref 4.22–5.81)

## 2013-01-02 MED ORDER — CYCLOBENZAPRINE HCL 5 MG PO TABS: 5.0000 mg | ORAL_TABLET | Freq: Three times a day (TID) | ORAL | Status: DC | PRN

## 2013-01-02 MED ORDER — TRAMADOL HCL 50 MG PO TABS: 50.0000 mg | ORAL_TABLET | Freq: Three times a day (TID) | ORAL | Status: DC | PRN

## 2013-01-02 MED ORDER — CITALOPRAM HYDROBROMIDE 10 MG PO TABS: 10.0000 mg | ORAL_TABLET | Freq: Every day | ORAL | Status: DC

## 2013-01-02 NOTE — Assessment & Plan Note (Signed)
C/w msk strain, for pain control, muscle relaxer prn,  to f/u any worsening symptoms or concerns  

## 2013-01-02 NOTE — Assessment & Plan Note (Signed)
New, declines cousneling, for citalopram 10 qd

## 2013-01-02 NOTE — Assessment & Plan Note (Signed)
?   Viral illness, exam benign, no GU symtpoms, to cont to follow
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
## 2013-01-02 NOTE — Patient Instructions (Signed)
Please take all new medication as prescribed Please continue all other medications as before, and refills have been done if requested. Please have the pharmacy call with any other refills you may need. Please go to the LAB in the Basement (turn left off the elevator) for the tests to be done today You will be contacted by phone if any changes need to be made immediately.  Otherwise, you will receive a letter about your results with an explanation, but please check with MyChart first.  Please remember to sign up for My Chart if you have not done so, as this will be important to you in the future with finding out test results, communicating by private email, and scheduling acute appointments online when needed.  Please return in 1 months, or sooner if needed

## 2013-01-03 ENCOUNTER — Encounter: Payer: Self-pay | Admitting: Internal Medicine

## 2013-01-03 LAB — LIPID PANEL
Cholesterol: 153 mg/dL (ref 0–200)
LDL Cholesterol: 93 mg/dL (ref 0–99)
Total CHOL/HDL Ratio: 4
VLDL: 24.8 mg/dL (ref 0.0–40.0)

## 2013-01-03 LAB — HEPATIC FUNCTION PANEL
ALT: 35 U/L (ref 0–53)
AST: 30 U/L (ref 0–37)
Albumin: 3.5 g/dL (ref 3.5–5.2)
Alkaline Phosphatase: 46 U/L (ref 39–117)
Total Protein: 6.9 g/dL (ref 6.0–8.3)

## 2013-01-03 LAB — BASIC METABOLIC PANEL
Calcium: 9.6 mg/dL (ref 8.4–10.5)
Creatinine, Ser: 1.2 mg/dL (ref 0.4–1.5)
GFR: 63.55 mL/min (ref >60.00)

## 2013-01-24 ENCOUNTER — Ambulatory Visit: Payer: Medicare Other | Admitting: Internal Medicine

## 2013-06-18 ENCOUNTER — Encounter: Payer: Self-pay | Admitting: *Deleted

## 2013-06-30 ENCOUNTER — Ambulatory Visit (INDEPENDENT_AMBULATORY_CARE_PROVIDER_SITE_OTHER): Payer: Medicare Other | Admitting: Cardiology

## 2013-06-30 ENCOUNTER — Encounter: Payer: Self-pay | Admitting: *Deleted

## 2013-06-30 ENCOUNTER — Encounter: Payer: Self-pay | Admitting: Cardiology

## 2013-06-30 VITALS — BP 98/71 | HR 62 | Ht 68.0 in | Wt 190.8 lb

## 2013-06-30 DIAGNOSIS — I4729 Other ventricular tachycardia: Secondary | ICD-10-CM

## 2013-06-30 DIAGNOSIS — I428 Other cardiomyopathies: Secondary | ICD-10-CM

## 2013-06-30 DIAGNOSIS — I4949 Other premature depolarization: Secondary | ICD-10-CM

## 2013-06-30 DIAGNOSIS — I2510 Atherosclerotic heart disease of native coronary artery without angina pectoris: Secondary | ICD-10-CM

## 2013-06-30 DIAGNOSIS — I472 Ventricular tachycardia: Secondary | ICD-10-CM

## 2013-06-30 DIAGNOSIS — R0602 Shortness of breath: Secondary | ICD-10-CM

## 2013-06-30 LAB — BRAIN NATRIURETIC PEPTIDE: Pro B Natriuretic peptide (BNP): 32 pg/mL (ref 0.0–100.0)

## 2013-06-30 NOTE — Patient Instructions (Signed)
Your physician recommends that you have  lab work today--BNP.  Your physician has requested that you have an echocardiogram. Echocardiography is a painless test that uses sound waves to create images of your heart. It provides your doctor with information about the size and shape of your heart and how well your heart's chambers and valves are working. This procedure takes approximately one hour. There are no restrictions for this procedure.  Your physician wants you to follow-up in: 1 year with Dr Aundra Dubin. (January  2016). You will receive a reminder letter in the mail two months in advance. If you don't receive a letter, please call our office to schedule the follow-up appointment.
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
## 2013-07-03 ENCOUNTER — Ambulatory Visit (HOSPITAL_COMMUNITY): Payer: Medicare Other | Attending: Cardiology | Admitting: Radiology

## 2013-07-03 ENCOUNTER — Encounter: Payer: Self-pay | Admitting: Cardiology

## 2013-07-03 DIAGNOSIS — I472 Ventricular tachycardia, unspecified: Secondary | ICD-10-CM | POA: Insufficient documentation

## 2013-07-03 DIAGNOSIS — E785 Hyperlipidemia, unspecified: Secondary | ICD-10-CM | POA: Insufficient documentation

## 2013-07-03 DIAGNOSIS — I2510 Atherosclerotic heart disease of native coronary artery without angina pectoris: Secondary | ICD-10-CM | POA: Insufficient documentation

## 2013-07-03 DIAGNOSIS — I428 Other cardiomyopathies: Secondary | ICD-10-CM

## 2013-07-03 DIAGNOSIS — I4729 Other ventricular tachycardia: Secondary | ICD-10-CM | POA: Insufficient documentation

## 2013-07-03 DIAGNOSIS — R0989 Other specified symptoms and signs involving the circulatory and respiratory systems: Secondary | ICD-10-CM | POA: Insufficient documentation

## 2013-07-03 DIAGNOSIS — R0609 Other forms of dyspnea: Secondary | ICD-10-CM | POA: Insufficient documentation

## 2013-07-03 DIAGNOSIS — R0602 Shortness of breath: Secondary | ICD-10-CM

## 2013-07-03 NOTE — Progress Notes (Signed)
Echocardiogram performed.  

## 2013-07-07 ENCOUNTER — Other Ambulatory Visit: Payer: Self-pay | Admitting: *Deleted

## 2013-07-07 MED ORDER — CARVEDILOL 6.25 MG PO TABS: 6.2500 mg | ORAL_TABLET | Freq: Two times a day (BID) | ORAL | Status: DC

## 2013-07-31 ENCOUNTER — Encounter: Payer: Self-pay | Admitting: Cardiology

## 2013-07-31 ENCOUNTER — Ambulatory Visit (INDEPENDENT_AMBULATORY_CARE_PROVIDER_SITE_OTHER): Payer: Medicare Other | Admitting: Cardiology

## 2013-07-31 VITALS — BP 96/68 | HR 74 | Ht 68.0 in | Wt 199.0 lb

## 2013-07-31 DIAGNOSIS — R0602 Shortness of breath: Secondary | ICD-10-CM

## 2013-07-31 DIAGNOSIS — I2510 Atherosclerotic heart disease of native coronary artery without angina pectoris: Secondary | ICD-10-CM

## 2013-07-31 DIAGNOSIS — E785 Hyperlipidemia, unspecified: Secondary | ICD-10-CM

## 2013-07-31 DIAGNOSIS — R0989 Other specified symptoms and signs involving the circulatory and respiratory systems: Secondary | ICD-10-CM

## 2013-07-31 DIAGNOSIS — R0609 Other forms of dyspnea: Secondary | ICD-10-CM

## 2013-07-31 DIAGNOSIS — I428 Other cardiomyopathies: Secondary | ICD-10-CM

## 2013-07-31 NOTE — Patient Instructions (Signed)
Your physician recommends that you continue on your current medications as directed. Please refer to the Current Medication list given to you today.  Your physician has requested that you have en exercise stress myoview. For further information please visit [email or web address]. Please follow instruction sheet, as given.  Your physician wants you to follow-up in: 4 months with Dr. Aundra Dubin. You will receive a reminder letter in the mail two months in advance. If you don't receive a letter, please call our office to schedule the follow-up appointment.
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
## 2013-08-04 ENCOUNTER — Ambulatory Visit (HOSPITAL_COMMUNITY): Payer: Medicare Other | Attending: Cardiology | Admitting: Radiology

## 2013-08-04 VITALS — BP 129/78 | HR 64 | Ht 68.0 in | Wt 196.0 lb

## 2013-08-04 DIAGNOSIS — I2510 Atherosclerotic heart disease of native coronary artery without angina pectoris: Secondary | ICD-10-CM | POA: Insufficient documentation

## 2013-08-04 DIAGNOSIS — R0602 Shortness of breath: Secondary | ICD-10-CM

## 2013-08-04 DIAGNOSIS — I472 Ventricular tachycardia, unspecified: Secondary | ICD-10-CM | POA: Insufficient documentation

## 2013-08-04 DIAGNOSIS — R9431 Abnormal electrocardiogram [ECG] [EKG]: Secondary | ICD-10-CM | POA: Insufficient documentation

## 2013-08-04 DIAGNOSIS — R079 Chest pain, unspecified: Secondary | ICD-10-CM | POA: Insufficient documentation

## 2013-08-04 DIAGNOSIS — R0989 Other specified symptoms and signs involving the circulatory and respiratory systems: Secondary | ICD-10-CM | POA: Insufficient documentation

## 2013-08-04 DIAGNOSIS — R569 Unspecified convulsions: Secondary | ICD-10-CM | POA: Insufficient documentation

## 2013-08-04 DIAGNOSIS — I4729 Other ventricular tachycardia: Secondary | ICD-10-CM | POA: Insufficient documentation

## 2013-08-04 DIAGNOSIS — R0609 Other forms of dyspnea: Secondary | ICD-10-CM | POA: Insufficient documentation

## 2013-08-04 DIAGNOSIS — I10 Essential (primary) hypertension: Secondary | ICD-10-CM | POA: Insufficient documentation

## 2013-08-04 DIAGNOSIS — E785 Hyperlipidemia, unspecified: Secondary | ICD-10-CM | POA: Insufficient documentation

## 2013-08-04 MED ORDER — TECHNETIUM TC 99M SESTAMIBI GENERIC - CARDIOLITE
33.0000 | Freq: Once | INTRAVENOUS | Status: AC | PRN
  Administered 2013-08-04: 33 via INTRAVENOUS

## 2013-08-04 MED ORDER — REGADENOSON 0.4 MG/5ML IV SOLN
0.4000 mg | Freq: Once | INTRAVENOUS | Status: AC
  Administered 2013-08-04: 0.4 mg via INTRAVENOUS

## 2013-08-04 MED ORDER — TECHNETIUM TC 99M SESTAMIBI GENERIC - CARDIOLITE
10.8000 | Freq: Once | INTRAVENOUS | Status: AC | PRN
  Administered 2013-08-04: 11 via INTRAVENOUS

## 2013-08-04 NOTE — Progress Notes (Addendum)
[facility] [address] [place], [place] [address] [phone]    Cardiology Nuclear Med Study  Gary Atkins is a 73 y.o. male     MRN : [id number]     DOB: 09/04/1940  Procedure Date: 08/04/2013  Nuclear Med Background Indication for Stress Test:  Evaluation for Ischemia and Abnormal EKG History:  CAD, Cath (mild to mod dz.), hx VT ablation, Echo 2015 EF 40-45%, MR 2011 (normal), hx. seizures Cardiac Risk Factors: Hypertension and Lipids  Symptoms:  Chest Pain (last date of chest discomfort was two to three weeks ago), DOE and SOB   Nuclear Pre-Procedure Caffeine/Decaff Intake:  None NPO After: 7:00pm   Lungs:  clear O2 Sat: 96% on room air. IV 0.9% NS with Angio Cath:  22g  IV Site: R Antecubital  IV Started by:  Crissie Figures, RN  Chest Size (in):  44 Cup Size: n/a  Height: 5\' 8"  (1.727 m)  Weight:  196 lb (88.905 kg)  BMI:  Body mass index is 29.81 kg/(m^2). Tech Comments:  Held Coreg x 12 hrs    Nuclear Med Study 1 or 2 day study: 1 day  Stress Test Type:  Treadmill/Lexiscan  Reading MD: N/A  Order Authorizing Provider:  Loralie Champagne, MD  Resting Radionuclide: Technetium 63m Sestamibi  Resting Radionuclide Dose: 11.0 mCi   Stress Radionuclide:  Technetium 27m Sestamibi  Stress Radionuclide Dose: 33.0 mCi           Stress Protocol Rest HR: 64 Stress HR: 105  Rest BP: 129/78 Stress BP: 127/88  Exercise Time (min): n/a METS: n/a           Dose of Adenosine (mg):  n/a Dose of Lexiscan: 0.4 mg  Dose of Atropine (mg): n/a Dose of Dobutamine: n/a mcg/kg/min (at max HR)  Stress Test Technologist: Glade Lloyd, BS-ES  Nuclear Technologist:  Charlton Amor, CNMT     Rest Procedure:  Myocardial perfusion imaging was performed at rest 45 minutes following the intravenous administration of Technetium 86m Sestamibi. Rest ECG: NSR - Normal EKG  Stress Procedure:  The patient received IV Lexiscan 0.4 mg over 15-seconds with concurrent  low level exercise and then Technetium 9m Sestamibi was injected at 30-seconds while the patient continued walking one more minute.  Quantitative spect images were obtained after a 45-minute delay.  During the infusion of Lexiscan, the patient complained of SOB.  This began to resolve in recovery.  Stress ECG: No significant change from baseline ECG  QPS Raw Data Images:  There is interference from nuclear activity from structures below the diaphragm. This does not affect the ability to read the study. Stress Images:  Normal homogeneous uptake in all areas of the myocardium. Rest Images:  Normal homogeneous uptake in all areas of the myocardium. Subtraction (SDS):  No evidence of ischemia. Transient Ischemic Dilatation (Normal <1.22):  0.85 Lung/Heart Ratio (Normal <0.45):  0.33  Quantitative Gated Spect Images QGS EDV:  69 ml QGS ESV:  24 ml  Impression Exercise Capacity:  Lexiscan with low level exercise. The patient exercised for 7:06 minutes and ahieved 6 METS. Unable to reach target HR.  BP Response:  Normal blood pressure response. Clinical Symptoms:  There is dyspnea. ECG Impression:  No significant ST segment change suggestive of ischemia. Comparison with Prior Nuclear Study: No significant change from previous study  Overall Impression:  Normal stress nuclear study.  LV Ejection Fraction: 65%.  LV Wall Motion:  NL LV Function;  NL Wall Motion  Gary Atkins, H 08/04/2013  No evidence for ischemia or infarction.  EF actually read as normal on this study.  Will need to compare to echo.   Loralie Champagne 08/05/2013 1:10 PM

## 2013-08-06 NOTE — Progress Notes (Signed)
Pt.notified

## 2013-08-23 ENCOUNTER — Other Ambulatory Visit: Payer: Self-pay | Admitting: Cardiology

## 2013-09-22 ENCOUNTER — Other Ambulatory Visit: Payer: Self-pay | Admitting: *Deleted

## 2013-09-22 MED ORDER — CARVEDILOL 6.25 MG PO TABS: 6.2500 mg | ORAL_TABLET | Freq: Two times a day (BID) | ORAL | Status: DC

## 2013-09-22 MED ORDER — LISINOPRIL 2.5 MG PO TABS: ORAL_TABLET | ORAL | Status: DC

## 2013-09-22 MED ORDER — SIMVASTATIN 40 MG PO TABS: ORAL_TABLET | ORAL | Status: DC

## 2013-10-15 ENCOUNTER — Other Ambulatory Visit: Payer: Self-pay | Admitting: *Deleted

## 2013-10-15 MED ORDER — LISINOPRIL 2.5 MG PO TABS: ORAL_TABLET | ORAL | Status: DC

## 2013-10-20 ENCOUNTER — Telehealth: Payer: Self-pay

## 2013-10-20 ENCOUNTER — Other Ambulatory Visit: Payer: Self-pay | Admitting: *Deleted

## 2013-10-20 MED ORDER — CARVEDILOL 6.25 MG PO TABS: 6.2500 mg | ORAL_TABLET | Freq: Two times a day (BID) | ORAL | Status: DC

## 2013-10-20 NOTE — Telephone Encounter (Signed)
Patient came to office to pick up a written rx for coreg gave rx to him  At the front desk

## 2013-11-28 ENCOUNTER — Encounter: Payer: Self-pay | Admitting: Cardiology

## 2013-11-28 ENCOUNTER — Ambulatory Visit (INDEPENDENT_AMBULATORY_CARE_PROVIDER_SITE_OTHER): Payer: Medicare Other | Admitting: Cardiology

## 2013-11-28 VITALS — BP 104/80 | HR 88 | Ht 68.0 in | Wt 195.0 lb

## 2013-11-28 DIAGNOSIS — E785 Hyperlipidemia, unspecified: Secondary | ICD-10-CM

## 2013-11-28 DIAGNOSIS — I428 Other cardiomyopathies: Secondary | ICD-10-CM

## 2013-11-28 DIAGNOSIS — I2510 Atherosclerotic heart disease of native coronary artery without angina pectoris: Secondary | ICD-10-CM

## 2013-11-28 MED ORDER — CARVEDILOL 6.25 MG PO TABS: 6.2500 mg | ORAL_TABLET | Freq: Two times a day (BID) | ORAL | Status: DC

## 2013-11-28 MED ORDER — LISINOPRIL 5 MG PO TABS: 5.0000 mg | ORAL_TABLET | Freq: Every day | ORAL | Status: DC

## 2013-11-28 MED ORDER — SIMVASTATIN 40 MG PO TABS: ORAL_TABLET | ORAL | Status: DC

## 2013-11-28 NOTE — Patient Instructions (Addendum)
Increase lisinopril to 5mg  daily. You can take 2 of your 2.5mg  tablets daily at the same time and use your current supply.  Your physician recommends that you return for a FASTING lipid profile /BMET in 2 weeks.   Your physician wants you to follow-up in: 6 months with Dr Aundra Dubin. (December 2015). You will receive a reminder letter in the mail two months in advance. If you don't receive a letter, please call our office to schedule the follow-up appointment.
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
## 2013-12-12 ENCOUNTER — Other Ambulatory Visit (INDEPENDENT_AMBULATORY_CARE_PROVIDER_SITE_OTHER): Payer: Medicare Other

## 2013-12-12 DIAGNOSIS — I2510 Atherosclerotic heart disease of native coronary artery without angina pectoris: Secondary | ICD-10-CM

## 2013-12-12 DIAGNOSIS — E785 Hyperlipidemia, unspecified: Secondary | ICD-10-CM

## 2013-12-12 LAB — LIPID PANEL
CHOLESTEROL: 155 mg/dL (ref 0–200)
HDL: 32 mg/dL — ABNORMAL LOW (ref >39.00)
LDL Cholesterol: 103 mg/dL — ABNORMAL HIGH (ref 0–99)
NonHDL: 123
Total CHOL/HDL Ratio: 5
Triglycerides: 102 mg/dL (ref 0.0–149.0)
VLDL: 20.4 mg/dL (ref 0.0–40.0)

## 2013-12-12 LAB — BASIC METABOLIC PANEL
BUN: 19 mg/dL (ref 6–23)
CO2: 30 meq/L (ref 19–32)
Calcium: 9.3 mg/dL (ref 8.4–10.5)
Chloride: 104 mEq/L (ref 96–112)
Creatinine, Ser: 1.1 mg/dL (ref 0.4–1.5)
GFR: 68.69 mL/min (ref >60.00)
Glucose, Bld: 101 mg/dL — ABNORMAL HIGH (ref 70–99)
Potassium: 4.7 mEq/L (ref 3.5–5.1)
SODIUM: 140 meq/L (ref 135–145)

## 2013-12-15 ENCOUNTER — Telehealth: Payer: Self-pay | Admitting: Cardiology

## 2013-12-15 DIAGNOSIS — I2510 Atherosclerotic heart disease of native coronary artery without angina pectoris: Secondary | ICD-10-CM

## 2013-12-15 DIAGNOSIS — E785 Hyperlipidemia, unspecified: Secondary | ICD-10-CM

## 2013-12-15 DIAGNOSIS — I428 Other cardiomyopathies: Secondary | ICD-10-CM

## 2013-12-15 MED ORDER — ATORVASTATIN CALCIUM 20 MG PO TABS: 20.0000 mg | ORAL_TABLET | Freq: Every day | ORAL | Status: DC

## 2013-12-15 NOTE — Addendum Note (Signed)
Addended by: Katrine Coho on: 12/15/2013 09:24 AM   Modules accepted: Orders, Medications

## 2013-12-15 NOTE — Telephone Encounter (Signed)
Spoke with patient about recent lab results 

## 2013-12-15 NOTE — Telephone Encounter (Signed)
New message     Patient calling stating nurse called him today regarding lab work.

## 2014-01-21 ENCOUNTER — Ambulatory Visit (INDEPENDENT_AMBULATORY_CARE_PROVIDER_SITE_OTHER): Payer: Medicare Other | Admitting: Internal Medicine

## 2014-01-21 ENCOUNTER — Encounter: Payer: Self-pay | Admitting: Internal Medicine

## 2014-01-21 VITALS — BP 112/64 | HR 76 | Temp 98.2°F | Wt 202.1 lb

## 2014-01-21 DIAGNOSIS — F3289 Other specified depressive episodes: Secondary | ICD-10-CM

## 2014-01-21 DIAGNOSIS — F329 Major depressive disorder, single episode, unspecified: Secondary | ICD-10-CM

## 2014-01-21 DIAGNOSIS — F32A Depression, unspecified: Secondary | ICD-10-CM

## 2014-01-21 DIAGNOSIS — H9191 Unspecified hearing loss, right ear: Secondary | ICD-10-CM

## 2014-01-21 DIAGNOSIS — H9190 Unspecified hearing loss, unspecified ear: Secondary | ICD-10-CM

## 2014-01-21 NOTE — Progress Notes (Signed)
Pre visit review using our clinic review tool, if applicable. No additional management support is needed unless otherwise documented below in the visit note. 
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
## 2014-01-21 NOTE — Patient Instructions (Signed)
Your left (and right) ears were irrigated of wax today  We can refer to ENT if you think the hearing isnt back to the usual level  Please continue all other medications as before, and refills have been done if requested.  Please keep your appointments with your specialists as you may have planned

## 2014-01-24 DIAGNOSIS — H9191 Unspecified hearing loss, right ear: Secondary | ICD-10-CM | POA: Insufficient documentation

## 2014-01-24 NOTE — Assessment & Plan Note (Signed)
stable overall by history and exam, and pt to continue medical treatment as before,  to f/u any worsening symptoms or concerns 

## 2014-01-24 NOTE — Assessment & Plan Note (Signed)
Resolved with irrigation,  to f/u any worsening symptoms or concerns 

## 2014-02-24 ENCOUNTER — Other Ambulatory Visit (INDEPENDENT_AMBULATORY_CARE_PROVIDER_SITE_OTHER): Payer: Medicare Other

## 2014-02-24 DIAGNOSIS — E785 Hyperlipidemia, unspecified: Secondary | ICD-10-CM

## 2014-02-24 DIAGNOSIS — I2510 Atherosclerotic heart disease of native coronary artery without angina pectoris: Secondary | ICD-10-CM

## 2014-02-24 DIAGNOSIS — I428 Other cardiomyopathies: Secondary | ICD-10-CM

## 2014-02-24 LAB — LIPID PANEL
CHOL/HDL RATIO: 4
Cholesterol: 119 mg/dL (ref 0–200)
HDL: 28.4 mg/dL — AB (ref >39.00)
LDL CALC: 68 mg/dL (ref 0–99)
NonHDL: 90.6
Triglycerides: 113 mg/dL (ref 0.0–149.0)
VLDL: 22.6 mg/dL (ref 0.0–40.0)

## 2014-02-24 LAB — HEPATIC FUNCTION PANEL
ALBUMIN: 3.4 g/dL — AB (ref 3.5–5.2)
ALT: 29 U/L (ref 0–53)
AST: 23 U/L (ref 0–37)
Alkaline Phosphatase: 39 U/L (ref 39–117)
Bilirubin, Direct: 0.1 mg/dL (ref 0.0–0.3)
Total Bilirubin: 0.8 mg/dL (ref 0.2–1.2)
Total Protein: 7 g/dL (ref 6.0–8.3)

## 2014-04-14 ENCOUNTER — Other Ambulatory Visit: Payer: Self-pay | Admitting: Cardiology

## 2014-04-27 ENCOUNTER — Other Ambulatory Visit: Payer: Self-pay

## 2014-04-27 MED ORDER — CARVEDILOL 6.25 MG PO TABS: 6.2500 mg | ORAL_TABLET | Freq: Two times a day (BID) | ORAL | Status: DC

## 2014-05-18 ENCOUNTER — Other Ambulatory Visit: Payer: Self-pay

## 2014-05-18 MED ORDER — ATORVASTATIN CALCIUM 20 MG PO TABS
20.0000 mg | ORAL_TABLET | Freq: Every day | ORAL | Status: DC
Start: 2014-05-18 — End: 2014-11-17

## 2014-07-07 ENCOUNTER — Encounter: Payer: Self-pay | Admitting: *Deleted

## 2014-07-07 ENCOUNTER — Ambulatory Visit (INDEPENDENT_AMBULATORY_CARE_PROVIDER_SITE_OTHER): Payer: Medicare Other | Admitting: Cardiology

## 2014-07-07 ENCOUNTER — Encounter: Payer: Self-pay | Admitting: Cardiology

## 2014-07-07 VITALS — BP 124/64 | HR 58 | Ht 68.0 in | Wt 194.0 lb

## 2014-07-07 DIAGNOSIS — I5022 Chronic systolic (congestive) heart failure: Secondary | ICD-10-CM | POA: Diagnosis not present

## 2014-07-07 DIAGNOSIS — I4729 Other ventricular tachycardia: Secondary | ICD-10-CM

## 2014-07-07 DIAGNOSIS — I472 Ventricular tachycardia: Secondary | ICD-10-CM

## 2014-07-07 DIAGNOSIS — I2510 Atherosclerotic heart disease of native coronary artery without angina pectoris: Secondary | ICD-10-CM | POA: Diagnosis not present

## 2014-07-07 DIAGNOSIS — I5042 Chronic combined systolic (congestive) and diastolic (congestive) heart failure: Secondary | ICD-10-CM | POA: Insufficient documentation
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
## 2014-07-07 NOTE — Patient Instructions (Addendum)
Your physician has requested that you have an echocardiogram. Echocardiography is a painless test that uses sound waves to create images of your heart. It provides your doctor with information about the size and shape of your heart and how well your heart's chambers and valves are working. This procedure takes approximately one hour. There are no restrictions for this procedure.  Your physician recommends that you return for lab work when you return for your echocardiogram.--BMET. Your physician wants you to follow-up in: 6 months with Dr Aundra Dubin. (July 2016). You will receive a reminder letter in the mail two months in advance. If you don't receive a letter, please call our office to schedule the follow-up appointment.

## 2014-07-10 ENCOUNTER — Other Ambulatory Visit (HOSPITAL_COMMUNITY): Payer: Medicare Other

## 2014-07-10 ENCOUNTER — Other Ambulatory Visit: Payer: Medicare Other

## 2014-07-15 ENCOUNTER — Other Ambulatory Visit (INDEPENDENT_AMBULATORY_CARE_PROVIDER_SITE_OTHER): Payer: Medicare Other | Admitting: *Deleted

## 2014-07-15 ENCOUNTER — Ambulatory Visit (HOSPITAL_COMMUNITY): Payer: Medicare Other | Attending: Cardiovascular Disease | Admitting: Radiology

## 2014-07-15 DIAGNOSIS — I2510 Atherosclerotic heart disease of native coronary artery without angina pectoris: Secondary | ICD-10-CM

## 2014-07-15 LAB — BASIC METABOLIC PANEL
BUN: 27 mg/dL — ABNORMAL HIGH (ref 6–23)
CALCIUM: 9.6 mg/dL (ref 8.4–10.5)
CO2: 30 meq/L (ref 19–32)
Chloride: 103 mEq/L (ref 96–112)
Creatinine, Ser: 1.11 mg/dL (ref 0.40–1.50)
GFR: 68.58 mL/min (ref >60.00)
Glucose, Bld: 113 mg/dL — ABNORMAL HIGH (ref 70–99)
Potassium: 4.5 mEq/L (ref 3.5–5.1)
Sodium: 140 mEq/L (ref 135–145)

## 2014-07-15 NOTE — Progress Notes (Signed)
Echocardiogram performed.  

## 2014-07-20 ENCOUNTER — Telehealth: Payer: Self-pay | Admitting: Cardiology

## 2014-07-20 NOTE — Telephone Encounter (Signed)
F/U ° ° ° ° ° ° ° ° ° °Pt returning call. Please call back.  °

## 2014-07-20 NOTE — Telephone Encounter (Signed)
New message     Want echo results and pt is getting low on his medication

## 2014-07-20 NOTE — Telephone Encounter (Signed)
Spoke with patient about echo and lab results.

## 2014-07-20 NOTE — Telephone Encounter (Signed)
LMTCB

## 2014-08-17 ENCOUNTER — Encounter: Payer: Self-pay | Admitting: Family

## 2014-08-17 ENCOUNTER — Ambulatory Visit (INDEPENDENT_AMBULATORY_CARE_PROVIDER_SITE_OTHER): Payer: Medicare Other | Admitting: Family

## 2014-08-17 VITALS — BP 104/78 | HR 71 | Temp 98.0°F | Resp 18 | Ht 69.0 in | Wt 196.4 lb

## 2014-08-17 DIAGNOSIS — T148XXA Other injury of unspecified body region, initial encounter: Secondary | ICD-10-CM | POA: Insufficient documentation

## 2014-08-17 DIAGNOSIS — T148 Other injury of unspecified body region: Secondary | ICD-10-CM

## 2014-08-17 NOTE — Progress Notes (Signed)
   Subjective:    Patient ID: Gary Atkins, male    DOB: Feb 25, 1941, 74 y.o.   MRN: [id number]  Chief Complaint  Patient presents with  . Follow-up    Says that he has a cramp in his left calf, worried about it being a blood clot, does have some pain with walking x2 weeks     HPI:  Gary Atkins is a 74 y.o. male who presents today for an acute visit.  This is a new problem. Associated symptoms of left calf pain has been going on for approximately 2 weeks. Describes the pain as cramp-like/soreness feeling with an intensity of 8/10 at it worst. Right now the pain is around a 5/10. Denies any modifying factors. He is concerned about the potential of a blood clot. Indicates that it started while walking and denies any sounds or sensations heard or felt.   Allergies  Allergen Reactions  . Demerol [Meperidine]   . Meperidine Hcl     REACTION: siezure    Current Outpatient Prescriptions on File Prior to Visit  Medication Sig Dispense Refill  . ANDROGEL PUMP 20.25 MG/ACT (1.62%) GEL     . aspirin 81 MG tablet Take 81 mg by mouth daily.      Marland Kitchen atorvastatin (LIPITOR) 20 MG tablet Take 1 tablet (20 mg total) by mouth daily. 90 tablet 1  . carvedilol (COREG) 6.25 MG tablet Take 1 tablet (6.25 mg total) by mouth 2 (two) times daily. 180 tablet 3  . lisinopril (PRINIVIL,ZESTRIL) 5 MG tablet Take 1 tablet (5 mg total) by mouth daily. 90 tablet 3  . NON FORMULARY Take 500 mg by mouth daily. Tumeric    . RA KRILL OIL 500 MG CAPS Take 1 capsule by mouth daily.     . sildenafil (VIAGRA) 100 MG tablet Take 0.5-1 tablets (50-100 mg total) by mouth daily as needed for erectile dysfunction. 5 tablet 11  . valproic acid (DEPAKENE) 250 MG capsule Take 250 mg by mouth 4 (four) times daily.       No current facility-administered medications on file prior to visit.   Review of Systems  Cardiovascular: Negative for leg swelling.  Musculoskeletal:       Negative for redness and swelling.  Neurological:  Negative for numbness.      Objective:    BP 104/78 mmHg  Pulse 71  Temp(Src) 98 F (36.7 C) (Oral)  Resp 18  Ht 5\' 9"  (1.753 m)  Wt 196 lb 6.4 oz (89.086 kg)  BMI 28.99 kg/m2  SpO2 98% Nursing note and vital signs reviewed.  Physical Exam  Constitutional: He is oriented to person, place, and time. He appears well-developed and well-nourished. No distress.  Cardiovascular: Normal rate, regular rhythm, normal heart sounds and intact distal pulses.   Pulmonary/Chest: Effort normal and breath sounds normal.  Musculoskeletal:  No obvious deformity or discoloration of left calf noted. Mild edema. Palpable tenderness distal one third of tibia. Pain elicited. Homans sign negative. Ankle strength is 4+ out of 5. Range of motion is complete. Pulses are intact and appropriate  Neurological: He is alert and oriented to person, place, and time.  Skin: Skin is warm and dry.  Psychiatric: He has a normal mood and affect. His behavior is normal. Judgment and thought content normal.       Assessment & Plan:

## 2014-08-17 NOTE — Patient Instructions (Addendum)
Thank you for choosing [facility].  Summary/Instructions:  It appears as though you may have a bruise/ki If your symptoms worsen or fail to improve, please contact our office for further instruction, or in case of emergency go directly to the emergency room at the closest medical facility.

## 2014-08-17 NOTE — Progress Notes (Signed)
Pre visit review using our clinic review tool, if applicable. No additional management support is needed unless otherwise documented below in the visit note. 

## 2014-08-17 NOTE — Assessment & Plan Note (Signed)
Symptoms and exam consistent with contusion. Given patient's continued motion and no other risk factors for blood clots, potential for blood clot is extremely low. Treat conservatively at this time. Recommend ice/heat and Advil or Aleve for soreness. Anticipate resolution without further problem. Follow up if symptoms worsen.

## 2014-08-18 DIAGNOSIS — X32XXXD Exposure to sunlight, subsequent encounter: Secondary | ICD-10-CM | POA: Diagnosis not present

## 2014-08-18 DIAGNOSIS — L820 Inflamed seborrheic keratosis: Secondary | ICD-10-CM | POA: Diagnosis not present

## 2014-08-18 DIAGNOSIS — L570 Actinic keratosis: Secondary | ICD-10-CM | POA: Diagnosis not present

## 2014-08-18 DIAGNOSIS — C44622 Squamous cell carcinoma of skin of right upper limb, including shoulder: Secondary | ICD-10-CM | POA: Diagnosis not present

## 2014-08-27 DIAGNOSIS — E291 Testicular hypofunction: Secondary | ICD-10-CM | POA: Diagnosis not present

## 2014-09-15 DIAGNOSIS — Z08 Encounter for follow-up examination after completed treatment for malignant neoplasm: Secondary | ICD-10-CM | POA: Diagnosis not present

## 2014-09-15 DIAGNOSIS — C4401 Basal cell carcinoma of skin of lip: Secondary | ICD-10-CM | POA: Diagnosis not present

## 2014-09-15 DIAGNOSIS — Z85828 Personal history of other malignant neoplasm of skin: Secondary | ICD-10-CM | POA: Diagnosis not present

## 2014-09-15 DIAGNOSIS — L980 Pyogenic granuloma: Secondary | ICD-10-CM | POA: Diagnosis not present

## 2014-10-05 DIAGNOSIS — R351 Nocturia: Secondary | ICD-10-CM | POA: Diagnosis not present

## 2014-10-05 DIAGNOSIS — E291 Testicular hypofunction: Secondary | ICD-10-CM | POA: Diagnosis not present

## 2014-10-05 DIAGNOSIS — N5201 Erectile dysfunction due to arterial insufficiency: Secondary | ICD-10-CM | POA: Diagnosis not present

## 2014-10-14 DIAGNOSIS — Z85828 Personal history of other malignant neoplasm of skin: Secondary | ICD-10-CM | POA: Diagnosis not present

## 2014-10-14 DIAGNOSIS — Z08 Encounter for follow-up examination after completed treatment for malignant neoplasm: Secondary | ICD-10-CM | POA: Diagnosis not present

## 2014-11-17 ENCOUNTER — Other Ambulatory Visit: Payer: Self-pay

## 2014-11-17 MED ORDER — ATORVASTATIN CALCIUM 20 MG PO TABS: 20.0000 mg | ORAL_TABLET | Freq: Every day | ORAL | Status: DC

## 2014-11-17 NOTE — Telephone Encounter (Signed)
Per note 1.19.16

## 2014-11-20 DIAGNOSIS — L570 Actinic keratosis: Secondary | ICD-10-CM | POA: Diagnosis not present

## 2014-11-20 DIAGNOSIS — X32XXXD Exposure to sunlight, subsequent encounter: Secondary | ICD-10-CM | POA: Diagnosis not present

## 2014-11-20 DIAGNOSIS — L579 Skin changes due to chronic exposure to nonionizing radiation, unspecified: Secondary | ICD-10-CM | POA: Diagnosis not present

## 2014-12-09 ENCOUNTER — Ambulatory Visit (INDEPENDENT_AMBULATORY_CARE_PROVIDER_SITE_OTHER): Payer: Medicare Other | Admitting: Neurology

## 2014-12-09 ENCOUNTER — Encounter: Payer: Self-pay | Admitting: Neurology

## 2014-12-09 VITALS — BP 106/78 | HR 64 | Resp 16 | Ht 68.0 in | Wt 194.0 lb

## 2014-12-09 DIAGNOSIS — M545 Low back pain, unspecified: Secondary | ICD-10-CM

## 2014-12-09 DIAGNOSIS — H9191 Unspecified hearing loss, right ear: Secondary | ICD-10-CM | POA: Diagnosis not present

## 2014-12-09 DIAGNOSIS — R413 Other amnesia: Secondary | ICD-10-CM | POA: Insufficient documentation

## 2014-12-09 DIAGNOSIS — R569 Unspecified convulsions: Secondary | ICD-10-CM

## 2014-12-09 MED ORDER — DIVALPROEX SODIUM ER 500 MG PO TB24: 500.0000 mg | ORAL_TABLET | Freq: Two times a day (BID) | ORAL | Status: DC
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
## 2014-12-14 ENCOUNTER — Other Ambulatory Visit: Payer: Self-pay | Admitting: *Deleted

## 2014-12-14 MED ORDER — LISINOPRIL 5 MG PO TABS: 5.0000 mg | ORAL_TABLET | Freq: Every day | ORAL | Status: DC

## 2015-01-12 ENCOUNTER — Encounter: Payer: Self-pay | Admitting: Cardiology

## 2015-01-12 ENCOUNTER — Ambulatory Visit (INDEPENDENT_AMBULATORY_CARE_PROVIDER_SITE_OTHER): Payer: Medicare Other | Admitting: Cardiology

## 2015-01-12 VITALS — BP 110/74 | HR 70 | Ht 68.0 in | Wt 195.0 lb

## 2015-01-12 DIAGNOSIS — I5022 Chronic systolic (congestive) heart failure: Secondary | ICD-10-CM

## 2015-01-12 DIAGNOSIS — I2510 Atherosclerotic heart disease of native coronary artery without angina pectoris: Secondary | ICD-10-CM | POA: Diagnosis not present

## 2015-01-12 MED ORDER — ATORVASTATIN CALCIUM 20 MG PO TABS: 20.0000 mg | ORAL_TABLET | Freq: Every day | ORAL | Status: DC

## 2015-01-12 MED ORDER — LISINOPRIL 5 MG PO TABS: 5.0000 mg | ORAL_TABLET | Freq: Two times a day (BID) | ORAL | Status: DC

## 2015-01-12 NOTE — Patient Instructions (Signed)
Medication Instructions:  Increase lisinopril to 5mg  two times a day  Labwork: Your physician recommends that you return for lab work in: 2 weeks--BMET. The lab is open from 7:30AM to 5PM.    Testing/Procedures: None today  Follow-Up: Your physician wants you to follow-up in: 6 months with Dr Aundra Dubin. (January 2017).  You will receive a reminder letter in the mail two months in advance. If you don't receive a letter, please call our office to schedule the follow-up appointment.
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
## 2015-01-26 ENCOUNTER — Other Ambulatory Visit (INDEPENDENT_AMBULATORY_CARE_PROVIDER_SITE_OTHER): Payer: Medicare Other | Admitting: *Deleted

## 2015-01-26 DIAGNOSIS — I2510 Atherosclerotic heart disease of native coronary artery without angina pectoris: Secondary | ICD-10-CM

## 2015-01-26 LAB — BASIC METABOLIC PANEL
BUN: 27 mg/dL — AB (ref 6–23)
CALCIUM: 9.3 mg/dL (ref 8.4–10.5)
CO2: 28 meq/L (ref 19–32)
Chloride: 102 mEq/L (ref 96–112)
Creatinine, Ser: 1.23 mg/dL (ref 0.40–1.50)
GFR: 60.83 mL/min (ref >60.00)
Glucose, Bld: 159 mg/dL — ABNORMAL HIGH (ref 70–99)
Potassium: 4.7 mEq/L (ref 3.5–5.1)
Sodium: 137 mEq/L (ref 135–145)

## 2015-03-16 DIAGNOSIS — L570 Actinic keratosis: Secondary | ICD-10-CM | POA: Diagnosis not present

## 2015-03-16 DIAGNOSIS — D225 Melanocytic nevi of trunk: Secondary | ICD-10-CM | POA: Diagnosis not present

## 2015-03-16 DIAGNOSIS — L820 Inflamed seborrheic keratosis: Secondary | ICD-10-CM | POA: Diagnosis not present

## 2015-03-16 DIAGNOSIS — Z08 Encounter for follow-up examination after completed treatment for malignant neoplasm: Secondary | ICD-10-CM | POA: Diagnosis not present

## 2015-03-16 DIAGNOSIS — Z85828 Personal history of other malignant neoplasm of skin: Secondary | ICD-10-CM | POA: Diagnosis not present

## 2015-03-16 DIAGNOSIS — L718 Other rosacea: Secondary | ICD-10-CM | POA: Diagnosis not present

## 2015-04-22 ENCOUNTER — Other Ambulatory Visit: Payer: Self-pay | Admitting: *Deleted

## 2015-04-22 MED ORDER — CARVEDILOL 6.25 MG PO TABS: 6.2500 mg | ORAL_TABLET | Freq: Two times a day (BID) | ORAL | Status: DC

## 2015-04-22 NOTE — Telephone Encounter (Signed)
pt does not want to use [facility] for his medications. sending refill to [facility].

## 2015-07-05 ENCOUNTER — Encounter: Payer: Self-pay | Admitting: Internal Medicine

## 2015-07-05 ENCOUNTER — Other Ambulatory Visit (INDEPENDENT_AMBULATORY_CARE_PROVIDER_SITE_OTHER): Payer: Medicare Other

## 2015-07-05 ENCOUNTER — Ambulatory Visit (INDEPENDENT_AMBULATORY_CARE_PROVIDER_SITE_OTHER): Payer: Medicare Other | Admitting: Internal Medicine

## 2015-07-05 ENCOUNTER — Ambulatory Visit (INDEPENDENT_AMBULATORY_CARE_PROVIDER_SITE_OTHER)
Admission: RE | Admit: 2015-07-05 | Discharge: 2015-07-05 | Disposition: A | Discharge status: Home / Self Care | Payer: Medicare Other | Source: Ambulatory Visit | Attending: Internal Medicine | Admitting: Internal Medicine

## 2015-07-05 VITALS — BP 126/82 | HR 81 | Temp 98.2°F | Resp 20 | Ht 68.0 in | Wt 189.8 lb

## 2015-07-05 DIAGNOSIS — R05 Cough: Secondary | ICD-10-CM

## 2015-07-05 DIAGNOSIS — E785 Hyperlipidemia, unspecified: Secondary | ICD-10-CM

## 2015-07-05 DIAGNOSIS — R059 Cough, unspecified: Secondary | ICD-10-CM | POA: Insufficient documentation

## 2015-07-05 DIAGNOSIS — Z0000 Encounter for general adult medical examination without abnormal findings: Secondary | ICD-10-CM

## 2015-07-05 DIAGNOSIS — K219 Gastro-esophageal reflux disease without esophagitis: Secondary | ICD-10-CM

## 2015-07-05 DIAGNOSIS — R739 Hyperglycemia, unspecified: Secondary | ICD-10-CM | POA: Diagnosis not present

## 2015-07-05 DIAGNOSIS — R0602 Shortness of breath: Secondary | ICD-10-CM | POA: Diagnosis not present

## 2015-07-05 DIAGNOSIS — R079 Chest pain, unspecified: Secondary | ICD-10-CM

## 2015-07-05 LAB — BASIC METABOLIC PANEL
BUN: 23 mg/dL (ref 6–23)
CHLORIDE: 102 meq/L (ref 96–112)
CO2: 25 mEq/L (ref 19–32)
Calcium: 10.1 mg/dL (ref 8.4–10.5)
Creatinine, Ser: 1.09 mg/dL (ref 0.40–1.50)
GFR: 69.85 mL/min (ref >60.00)
Glucose, Bld: 105 mg/dL — ABNORMAL HIGH (ref 70–99)
POTASSIUM: 5.2 meq/L — AB (ref 3.5–5.1)
SODIUM: 140 meq/L (ref 135–145)

## 2015-07-05 LAB — URINALYSIS, ROUTINE W REFLEX MICROSCOPIC
Bilirubin Urine: NEGATIVE
HGB URINE DIPSTICK: NEGATIVE
KETONES UR: NEGATIVE
LEUKOCYTES UA: NEGATIVE
NITRITE: NEGATIVE
RBC / HPF: NONE SEEN (ref 0–?)
Specific Gravity, Urine: >=1.03 — AB (ref 1.000–1.030)
Total Protein, Urine: NEGATIVE
URINE GLUCOSE: NEGATIVE
UROBILINOGEN UA: 0.2 (ref 0.0–1.0)
pH: 5.5 (ref 5.0–8.0)

## 2015-07-05 LAB — CBC WITH DIFFERENTIAL/PLATELET
BASOS PCT: 0.6 % (ref 0.0–3.0)
Basophils Absolute: 0 10*3/uL (ref 0.0–0.1)
EOS PCT: 11 % — AB (ref 0.0–5.0)
Eosinophils Absolute: 0.9 10*3/uL — ABNORMAL HIGH (ref 0.0–0.7)
HEMATOCRIT: 49 % (ref 39.0–52.0)
Hemoglobin: 16.4 g/dL (ref 13.0–17.0)
Lymphocytes Relative: 38.1 % (ref 12.0–46.0)
Lymphs Abs: 3.2 10*3/uL (ref 0.7–4.0)
MCHC: 33.4 g/dL (ref 30.0–36.0)
MCV: 93.3 fl (ref 78.0–100.0)
MONOS PCT: 11.2 % (ref 3.0–12.0)
Monocytes Absolute: 0.9 10*3/uL (ref 0.1–1.0)
NEUTROS ABS: 3.3 10*3/uL (ref 1.4–7.7)
Neutrophils Relative %: 39.1 % — ABNORMAL LOW (ref 43.0–77.0)
PLATELETS: 155 10*3/uL (ref 150.0–400.0)
RBC: 5.25 Mil/uL (ref 4.22–5.81)
RDW: 13.9 % (ref 11.5–15.5)
WBC: 8.3 10*3/uL (ref 4.0–10.5)

## 2015-07-05 LAB — LIPID PANEL
CHOL/HDL RATIO: 4
Cholesterol: 138 mg/dL (ref 0–200)
HDL: 36.6 mg/dL — ABNORMAL LOW (ref >39.00)
LDL Cholesterol: 74 mg/dL (ref 0–99)
NONHDL: 101.14
TRIGLYCERIDES: 135 mg/dL (ref 0.0–149.0)
VLDL: 27 mg/dL (ref 0.0–40.0)

## 2015-07-05 LAB — HEPATIC FUNCTION PANEL
ALK PHOS: 48 U/L (ref 39–117)
ALT: 27 U/L (ref 0–53)
AST: 17 U/L (ref 0–37)
Albumin: 4.4 g/dL (ref 3.5–5.2)
BILIRUBIN DIRECT: 0.2 mg/dL (ref 0.0–0.3)
BILIRUBIN TOTAL: 0.8 mg/dL (ref 0.2–1.2)
Total Protein: 7.3 g/dL (ref 6.0–8.3)

## 2015-07-05 LAB — HEMOGLOBIN A1C: Hgb A1c MFr Bld: 5.9 % (ref 4.6–6.5)

## 2015-07-05 LAB — PSA: PSA: 0.62 ng/mL (ref 0.10–4.00)

## 2015-07-05 LAB — TSH: TSH: 2.57 u[IU]/mL (ref 0.35–4.50)

## 2015-07-05 MED ORDER — PANTOPRAZOLE SODIUM 40 MG PO TBEC: 40.0000 mg | DELAYED_RELEASE_TABLET | Freq: Every day | ORAL | Status: DC

## 2015-07-05 NOTE — Progress Notes (Signed)
Pre visit review using our clinic review tool, if applicable. No additional management support is needed unless otherwise documented below in the visit note. 

## 2015-07-05 NOTE — Patient Instructions (Signed)
Please take all new medication as prescribed  - the protonix  Please continue all other medications as before, and refills have been done if requested.  Please have the pharmacy call with any other refills you may need.  Please continue your efforts at being more active, low cholesterol diet, and weight control.  You are otherwise up to date with prevention measures today.  Please keep your appointments with your specialists as you may have planned  Please go to the XRAY Department in the Basement (go straight as you get off the elevator) for the x-ray testing  Please go to the LAB in the Basement (turn left off the elevator) for the tests to be done today  You will be contacted by phone if any changes need to be made immediately.  Otherwise, you will receive a letter about your results with an explanation, but please check with MyChart first.  Please remember to sign up for MyChart if you have not done so, as this will be important to you in the future with finding out test results, communicating by private email, and scheduling acute appointments online when needed.  Please return in 6 months, or sooner if needed, with Lab testing done 3-5 days before
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
## 2015-07-06 NOTE — Assessment & Plan Note (Signed)
Suspect possibly msk vs pleurisy, for cxr as above

## 2015-07-06 NOTE — Assessment & Plan Note (Signed)
Acute x 3 wks, ? Some improved recent, for cxr,  to f/u any worsening symptoms or concerns

## 2015-07-06 NOTE — Assessment & Plan Note (Signed)
Mild to mod worsening persistent, no wt loss or other bowel change, for PPI,  to f/u any worsening symptoms or concerns

## 2015-07-06 NOTE — Assessment & Plan Note (Signed)
stable overall by history and exam, recent data reviewed with pt, and pt to continue medical treatment as before,  to f/u any worsening symptoms or concerns, also for a1c today Lab Results  Component Value Date   HGBA1C 5.9 07/05/2015

## 2015-07-06 NOTE — Assessment & Plan Note (Signed)

## 2015-08-27 DIAGNOSIS — H2511 Age-related nuclear cataract, right eye: Secondary | ICD-10-CM | POA: Diagnosis not present

## 2015-08-27 DIAGNOSIS — H25812 Combined forms of age-related cataract, left eye: Secondary | ICD-10-CM | POA: Diagnosis not present

## 2015-08-27 DIAGNOSIS — H40013 Open angle with borderline findings, low risk, bilateral: Secondary | ICD-10-CM | POA: Diagnosis not present

## 2015-09-20 DIAGNOSIS — H2512 Age-related nuclear cataract, left eye: Secondary | ICD-10-CM | POA: Diagnosis not present

## 2015-09-20 DIAGNOSIS — H25812 Combined forms of age-related cataract, left eye: Secondary | ICD-10-CM | POA: Diagnosis not present

## 2015-10-05 DIAGNOSIS — H903 Sensorineural hearing loss, bilateral: Secondary | ICD-10-CM | POA: Diagnosis not present

## 2015-12-09 ENCOUNTER — Encounter: Payer: Self-pay | Admitting: Neurology

## 2015-12-09 ENCOUNTER — Ambulatory Visit (INDEPENDENT_AMBULATORY_CARE_PROVIDER_SITE_OTHER): Payer: Medicare Other | Admitting: Neurology

## 2015-12-09 ENCOUNTER — Ambulatory Visit: Payer: Medicare Other | Admitting: Neurology

## 2015-12-09 VITALS — BP 108/72 | HR 68 | Resp 16 | Ht 68.0 in | Wt 192.5 lb

## 2015-12-09 DIAGNOSIS — R569 Unspecified convulsions: Secondary | ICD-10-CM

## 2015-12-09 DIAGNOSIS — R413 Other amnesia: Secondary | ICD-10-CM | POA: Diagnosis not present

## 2015-12-09 DIAGNOSIS — M545 Low back pain, unspecified: Secondary | ICD-10-CM

## 2015-12-09 MED ORDER — DIVALPROEX SODIUM ER 500 MG PO TB24: 500.0000 mg | ORAL_TABLET | Freq: Two times a day (BID) | ORAL | Status: DC
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
## 2015-12-10 ENCOUNTER — Telehealth: Payer: Self-pay | Admitting: *Deleted

## 2015-12-10 LAB — COMPREHENSIVE METABOLIC PANEL WITH GFR
ALT: 34 IU/L (ref 0–44)
AST: 24 IU/L (ref 0–40)
Albumin/Globulin Ratio: 1.6 (ref 1.2–2.2)
Albumin: 4.3 g/dL (ref 3.5–4.8)
Alkaline Phosphatase: 48 IU/L (ref 39–117)
BUN/Creatinine Ratio: 17 (ref 10–24)
BUN: 20 mg/dL (ref 8–27)
Bilirubin Total: 0.5 mg/dL (ref 0.0–1.2)
CO2: 23 mmol/L (ref 18–29)
Calcium: 10 mg/dL (ref 8.6–10.2)
Chloride: 101 mmol/L (ref 96–106)
Creatinine, Ser: 1.16 mg/dL (ref 0.76–1.27)
GFR calc Af Amer: 70 mL/min/1.73 (ref >59)
GFR calc non Af Amer: 61 mL/min/1.73 (ref >59)
Globulin, Total: 2.7 g/dL (ref 1.5–4.5)
Glucose: 132 mg/dL — ABNORMAL HIGH (ref 65–99)
Potassium: 4.6 mmol/L (ref 3.5–5.2)
Sodium: 141 mmol/L (ref 134–144)
Total Protein: 7 g/dL (ref 6.0–8.5)

## 2015-12-10 LAB — VALPROIC ACID LEVEL: Valproic Acid Lvl: 42 ug/mL — ABNORMAL LOW (ref 50–100)

## 2015-12-10 NOTE — Telephone Encounter (Signed)
I have spoken with [name] this morning, and per RAS, advised that pt's lft's are normal, and Depakote level is slightly low, but since pt. has not had a sz. in many yrs., RAS wanted him to continue current dose of Depakote.  [name] verbalized understanding of same/fim

## 2015-12-10 NOTE — Telephone Encounter (Signed)
-----   Message from Britt Bottom, MD sent at 12/10/2015  9:51 AM EDT ----- Please let him know that the Depakote level was slightly low (42, we like to see 50-100) but since he has been seizure-free x many years we are going to continue the same dose. LFTs were normal.

## 2015-12-16 ENCOUNTER — Other Ambulatory Visit: Payer: Self-pay | Admitting: Neurology

## 2016-02-04 ENCOUNTER — Other Ambulatory Visit: Payer: Self-pay | Admitting: Cardiology

## 2016-02-04 DIAGNOSIS — I2510 Atherosclerotic heart disease of native coronary artery without angina pectoris: Secondary | ICD-10-CM

## 2016-02-11 ENCOUNTER — Other Ambulatory Visit: Payer: Self-pay | Admitting: Cardiology

## 2016-02-11 DIAGNOSIS — I2510 Atherosclerotic heart disease of native coronary artery without angina pectoris: Secondary | ICD-10-CM

## 2016-02-29 DIAGNOSIS — H2511 Age-related nuclear cataract, right eye: Secondary | ICD-10-CM | POA: Diagnosis not present

## 2016-02-29 DIAGNOSIS — H401131 Primary open-angle glaucoma, bilateral, mild stage: Secondary | ICD-10-CM | POA: Diagnosis not present

## 2016-02-29 DIAGNOSIS — Z961 Presence of intraocular lens: Secondary | ICD-10-CM | POA: Diagnosis not present

## 2016-02-29 DIAGNOSIS — H10413 Chronic giant papillary conjunctivitis, bilateral: Secondary | ICD-10-CM | POA: Diagnosis not present

## 2016-03-06 ENCOUNTER — Other Ambulatory Visit: Payer: Self-pay | Admitting: Cardiology

## 2016-03-22 ENCOUNTER — Other Ambulatory Visit: Payer: Self-pay | Admitting: Cardiology

## 2016-03-22 DIAGNOSIS — I2510 Atherosclerotic heart disease of native coronary artery without angina pectoris: Secondary | ICD-10-CM

## 2016-04-06 ENCOUNTER — Encounter: Payer: Self-pay | Admitting: Cardiology

## 2016-04-18 ENCOUNTER — Ambulatory Visit: Payer: Medicare Other | Admitting: Cardiology

## 2016-04-24 ENCOUNTER — Encounter: Payer: Self-pay | Admitting: Physician Assistant

## 2016-04-24 ENCOUNTER — Ambulatory Visit (INDEPENDENT_AMBULATORY_CARE_PROVIDER_SITE_OTHER): Payer: Medicare Other | Admitting: Physician Assistant

## 2016-04-24 VITALS — BP 140/70 | HR 60 | Ht 68.0 in | Wt 187.8 lb

## 2016-04-24 DIAGNOSIS — I2510 Atherosclerotic heart disease of native coronary artery without angina pectoris: Secondary | ICD-10-CM | POA: Diagnosis not present

## 2016-04-24 DIAGNOSIS — E7800 Pure hypercholesterolemia, unspecified: Secondary | ICD-10-CM

## 2016-04-24 DIAGNOSIS — I420 Dilated cardiomyopathy: Secondary | ICD-10-CM

## 2016-04-24 DIAGNOSIS — I493 Ventricular premature depolarization: Secondary | ICD-10-CM | POA: Diagnosis not present

## 2016-04-24 MED ORDER — CARVEDILOL 6.25 MG PO TABS: 6.2500 mg | ORAL_TABLET | Freq: Two times a day (BID) | ORAL | 3 refills | Status: DC

## 2016-04-24 MED ORDER — LISINOPRIL 5 MG PO TABS: 5.0000 mg | ORAL_TABLET | Freq: Two times a day (BID) | ORAL | 3 refills | Status: DC

## 2016-04-24 MED ORDER — ATORVASTATIN CALCIUM 20 MG PO TABS: 20.0000 mg | ORAL_TABLET | Freq: Every day | ORAL | 3 refills | Status: DC

## 2016-04-24 NOTE — Patient Instructions (Addendum)
Medication Instructions:  YOU HAVE BEEN GIVEN RX'S FOR LISINOPRIL, COREG, LIPITOR   Labwork: NONE  Testing/Procedures: NONE  Follow-Up: DR. END IN 1 YEAR; WE WILL SEND OUT A REMINDER LETTER A COUPLE OF MONTHS EARLY TO CALL AND  MAKE AN APPT.  Any Other Special Instructions Will Be Listed Below (If Applicable).  If you need a refill on your cardiac medications before your next appointment, please call your pharmacy.
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
## 2016-04-24 NOTE — Progress Notes (Signed)
Have him see me in CHF clinic please.

## 2016-04-25 ENCOUNTER — Telehealth: Payer: Self-pay | Admitting: Physician Assistant

## 2016-04-25 NOTE — Telephone Encounter (Signed)
Reviewed with Dr. Loralie Champagne. He would like Tripp A Corbello to see him in the CHF clinic to follow up. Please make sure FU in 1 year is with Dr. Loralie Champagne in the [facility]. Cancel recall for Dr. Harrell Gave End  Richardson Dopp, PA-C   04/25/2016 1:27 PM

## 2016-04-25 NOTE — Telephone Encounter (Signed)
Recall placed for Dr. Aundra Dubin in 1 year in the [facility].

## 2016-05-10 ENCOUNTER — Encounter (HOSPITAL_COMMUNITY): Payer: Self-pay | Admitting: Emergency Medicine

## 2016-05-10 ENCOUNTER — Emergency Department (HOSPITAL_COMMUNITY)
Admission: EM | Admit: 2016-05-10 | Discharge: 2016-05-10 | Disposition: A | Discharge status: Home / Self Care | Payer: Medicare Other | Attending: Emergency Medicine | Admitting: Emergency Medicine

## 2016-05-10 ENCOUNTER — Emergency Department (HOSPITAL_COMMUNITY): Payer: Medicare Other

## 2016-05-10 DIAGNOSIS — Z79899 Other long term (current) drug therapy: Secondary | ICD-10-CM | POA: Diagnosis not present

## 2016-05-10 DIAGNOSIS — Z7982 Long term (current) use of aspirin: Secondary | ICD-10-CM | POA: Diagnosis not present

## 2016-05-10 DIAGNOSIS — W19XXXA Unspecified fall, initial encounter: Secondary | ICD-10-CM | POA: Insufficient documentation

## 2016-05-10 DIAGNOSIS — I5022 Chronic systolic (congestive) heart failure: Secondary | ICD-10-CM | POA: Diagnosis not present

## 2016-05-10 DIAGNOSIS — M545 Low back pain: Secondary | ICD-10-CM | POA: Diagnosis not present

## 2016-05-10 DIAGNOSIS — Z043 Encounter for examination and observation following other accident: Secondary | ICD-10-CM | POA: Diagnosis not present

## 2016-05-10 DIAGNOSIS — Y999 Unspecified external cause status: Secondary | ICD-10-CM | POA: Diagnosis not present

## 2016-05-10 DIAGNOSIS — I2510 Atherosclerotic heart disease of native coronary artery without angina pectoris: Secondary | ICD-10-CM | POA: Diagnosis not present

## 2016-05-10 DIAGNOSIS — Y929 Unspecified place or not applicable: Secondary | ICD-10-CM | POA: Diagnosis not present

## 2016-05-10 DIAGNOSIS — S3992XA Unspecified injury of lower back, initial encounter: Secondary | ICD-10-CM | POA: Diagnosis not present

## 2016-05-10 DIAGNOSIS — Y939 Activity, unspecified: Secondary | ICD-10-CM | POA: Insufficient documentation

## 2016-05-10 DIAGNOSIS — M549 Dorsalgia, unspecified: Secondary | ICD-10-CM | POA: Diagnosis not present

## 2016-05-10 MED ORDER — CYCLOBENZAPRINE HCL 10 MG PO TABS: 10.0000 mg | ORAL_TABLET | Freq: Three times a day (TID) | ORAL | 0 refills | Status: DC | PRN

## 2016-05-10 NOTE — ED Triage Notes (Signed)
Pt reports fall 4 days ago, landed on right side. Co right lower back pain with no radiation. Sts chronic back pain yet pain increased  in intensity since the fall. Alert and oriented x4. Pt ambulated to room with steady gait.

## 2016-05-10 NOTE — Discharge Instructions (Signed)
Follow up with your md next week. °
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
## 2016-05-16 ENCOUNTER — Telehealth: Payer: Self-pay | Admitting: Cardiology

## 2016-05-16 NOTE — Telephone Encounter (Signed)
I spoke to patient. He states he was fishing on Saturday (1 week ago) and fell down bank next to [place].  He c/o back injury.  He states he was not better by last Wednesday, so he was seen in [facility] ED.  He states x-rays did not show fractures, just "old injuries".  He is calling now c/o bilateral ankle/feet swelling. He states legs are sore from fall, but not painful. He states he is not able to lay down, has been sleeping in chair, is unable to elevate feet. He states he would like you to "prescribe something" for the swelling. I advised patient I would let you know his request, however, he should schedule a follow up appt with PCP. Pt voiced understanding and agreed with plan.

## 2016-05-16 NOTE — Telephone Encounter (Signed)
Can see me or PA in CHF clinic, need to take a look at him before starting Lasix, etc.

## 2016-05-16 NOTE — Telephone Encounter (Signed)
Pt c/o swelling: STAT is pt has developed SOB within 24 hours  1. How long have you been experiencing swelling? A couple days ago  2. Where is the swelling located? Ankles and Feet  3.  Are you currently taking a "fluid pill"?no  4.  Are you currently SOB? No  5.  Have you traveled recently?no

## 2016-05-17 NOTE — Telephone Encounter (Signed)
Staff message sent to Verne Grain, RN to have pt scheduled in [facility].

## 2016-05-18 ENCOUNTER — Encounter (INDEPENDENT_AMBULATORY_CARE_PROVIDER_SITE_OTHER): Payer: Self-pay | Admitting: Orthopedic Surgery

## 2016-05-18 ENCOUNTER — Ambulatory Visit (INDEPENDENT_AMBULATORY_CARE_PROVIDER_SITE_OTHER): Payer: Medicare Other | Admitting: Orthopedic Surgery

## 2016-05-18 DIAGNOSIS — M545 Low back pain, unspecified: Secondary | ICD-10-CM
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
## 2016-05-18 NOTE — Telephone Encounter (Signed)
Pt refused to sch appt, he states he is doing well and does not need to be seen or have any med adjustments.  Pt is in recalls for 6 months

## 2016-05-22 ENCOUNTER — Telehealth (INDEPENDENT_AMBULATORY_CARE_PROVIDER_SITE_OTHER): Payer: Self-pay | Admitting: Orthopedic Surgery

## 2016-05-22 NOTE — Telephone Encounter (Signed)
Is there a message that goes with this?

## 2016-05-23 ENCOUNTER — Ambulatory Visit
Admission: RE | Admit: 2016-05-23 | Discharge: 2016-05-23 | Disposition: A | Discharge status: Home / Self Care | Payer: Medicare Other | Source: Ambulatory Visit | Attending: Orthopedic Surgery | Admitting: Orthopedic Surgery

## 2016-05-23 DIAGNOSIS — M545 Low back pain, unspecified: Secondary | ICD-10-CM

## 2016-05-23 NOTE — Telephone Encounter (Signed)
Pt wife called stating pt had MRI this morning at 6:30 and they told them Dr. Marlou Sa would have this within 30 min. I advised the pt that Dr. Marlou Sa is in surgery all day. Pt will be out of town starting Friday morning and he wants these results before they leave. [phone]

## 2016-05-23 NOTE — Telephone Encounter (Signed)
See note from patient, see MRI report- is this something you can call him with?  Or do I need to find a workin appt for him this week?

## 2016-05-24 NOTE — Telephone Encounter (Signed)
I: Discussed the results with show about his MRI scan.  Had one of our spine surgeons look at it as well.  In general is a 50% compression fracture new at L1.  There is no cord compromise.  He has a corset.  He is being treated for osteoporosis.  No other systemic symptoms.  Plan is for repeat lateral standing radiographs on return.  If he develops progressive kyphosis which I think is possible based on his other compression fractures in the lumbar spine he will need surgical evaluation we will see him back after he returns from his trip in late December or early January he is instructed to call to make that appointment

## 2016-06-29 ENCOUNTER — Ambulatory Visit (INDEPENDENT_AMBULATORY_CARE_PROVIDER_SITE_OTHER): Payer: Medicare Other

## 2016-06-29 ENCOUNTER — Ambulatory Visit (INDEPENDENT_AMBULATORY_CARE_PROVIDER_SITE_OTHER): Payer: Medicare Other | Admitting: Orthopedic Surgery

## 2016-06-29 ENCOUNTER — Encounter (INDEPENDENT_AMBULATORY_CARE_PROVIDER_SITE_OTHER): Payer: Self-pay | Admitting: Orthopedic Surgery

## 2016-06-29 ENCOUNTER — Encounter (INDEPENDENT_AMBULATORY_CARE_PROVIDER_SITE_OTHER): Payer: Self-pay

## 2016-06-29 DIAGNOSIS — S32010A Wedge compression fracture of first lumbar vertebra, initial encounter for closed fracture: Secondary | ICD-10-CM | POA: Insufficient documentation

## 2016-06-29 DIAGNOSIS — S32010D Wedge compression fracture of first lumbar vertebra, subsequent encounter for fracture with routine healing: Secondary | ICD-10-CM
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
## 2016-08-30 DIAGNOSIS — H04123 Dry eye syndrome of bilateral lacrimal glands: Secondary | ICD-10-CM | POA: Diagnosis not present

## 2016-08-30 DIAGNOSIS — H401122 Primary open-angle glaucoma, left eye, moderate stage: Secondary | ICD-10-CM | POA: Diagnosis not present

## 2016-08-30 DIAGNOSIS — H401111 Primary open-angle glaucoma, right eye, mild stage: Secondary | ICD-10-CM | POA: Diagnosis not present

## 2016-08-30 DIAGNOSIS — H10413 Chronic giant papillary conjunctivitis, bilateral: Secondary | ICD-10-CM | POA: Diagnosis not present

## 2016-08-30 DIAGNOSIS — Z961 Presence of intraocular lens: Secondary | ICD-10-CM | POA: Diagnosis not present

## 2016-09-24 ENCOUNTER — Other Ambulatory Visit: Payer: Self-pay | Admitting: Internal Medicine

## 2016-10-10 DIAGNOSIS — C4442 Squamous cell carcinoma of skin of scalp and neck: Secondary | ICD-10-CM | POA: Diagnosis not present

## 2016-10-10 DIAGNOSIS — D225 Melanocytic nevi of trunk: Secondary | ICD-10-CM | POA: Diagnosis not present

## 2016-10-10 DIAGNOSIS — L570 Actinic keratosis: Secondary | ICD-10-CM | POA: Diagnosis not present

## 2016-10-10 DIAGNOSIS — X32XXXD Exposure to sunlight, subsequent encounter: Secondary | ICD-10-CM | POA: Diagnosis not present

## 2016-10-10 DIAGNOSIS — B078 Other viral warts: Secondary | ICD-10-CM | POA: Diagnosis not present

## 2016-10-10 DIAGNOSIS — I788 Other diseases of capillaries: Secondary | ICD-10-CM | POA: Diagnosis not present

## 2016-10-11 ENCOUNTER — Other Ambulatory Visit (INDEPENDENT_AMBULATORY_CARE_PROVIDER_SITE_OTHER): Payer: Medicare Other

## 2016-10-11 ENCOUNTER — Encounter: Payer: Self-pay | Admitting: Internal Medicine

## 2016-10-11 ENCOUNTER — Ambulatory Visit (INDEPENDENT_AMBULATORY_CARE_PROVIDER_SITE_OTHER): Payer: Medicare Other | Admitting: Internal Medicine

## 2016-10-11 VITALS — BP 98/70 | HR 65 | Ht 69.0 in | Wt 183.0 lb

## 2016-10-11 DIAGNOSIS — I2510 Atherosclerotic heart disease of native coronary artery without angina pectoris: Secondary | ICD-10-CM | POA: Diagnosis not present

## 2016-10-11 DIAGNOSIS — H9193 Unspecified hearing loss, bilateral: Secondary | ICD-10-CM | POA: Insufficient documentation

## 2016-10-11 DIAGNOSIS — Z0000 Encounter for general adult medical examination without abnormal findings: Secondary | ICD-10-CM | POA: Diagnosis not present

## 2016-10-11 DIAGNOSIS — R739 Hyperglycemia, unspecified: Secondary | ICD-10-CM | POA: Diagnosis not present

## 2016-10-11 DIAGNOSIS — Z23 Encounter for immunization: Secondary | ICD-10-CM | POA: Diagnosis not present

## 2016-10-11 LAB — URINALYSIS, ROUTINE W REFLEX MICROSCOPIC
Bilirubin Urine: NEGATIVE
HGB URINE DIPSTICK: NEGATIVE
Leukocytes, UA: NEGATIVE
NITRITE: NEGATIVE
PH: 6 (ref 5.0–8.0)
RBC / HPF: NONE SEEN (ref 0–?)
SPECIFIC GRAVITY, URINE: 1.025 (ref 1.000–1.030)
TOTAL PROTEIN, URINE-UPE24: NEGATIVE
URINE GLUCOSE: NEGATIVE
Urobilinogen, UA: 1 (ref 0.0–1.0)

## 2016-10-11 LAB — BASIC METABOLIC PANEL
BUN: 20 mg/dL (ref 6–23)
CALCIUM: 9.5 mg/dL (ref 8.4–10.5)
CO2: 29 meq/L (ref 19–32)
CREATININE: 1.17 mg/dL (ref 0.40–1.50)
Chloride: 101 mEq/L (ref 96–112)
GFR: 64.15 mL/min (ref >60.00)
GLUCOSE: 94 mg/dL (ref 70–99)
Potassium: 4.9 mEq/L (ref 3.5–5.1)
Sodium: 136 mEq/L (ref 135–145)

## 2016-10-11 LAB — CBC WITH DIFFERENTIAL/PLATELET
BASOS PCT: 1.3 % (ref 0.0–3.0)
Basophils Absolute: 0.1 10*3/uL (ref 0.0–0.1)
Eosinophils Absolute: 0.6 10*3/uL (ref 0.0–0.7)
Eosinophils Relative: 6.8 % — ABNORMAL HIGH (ref 0.0–5.0)
HCT: 48 % (ref 39.0–52.0)
HEMOGLOBIN: 16 g/dL (ref 13.0–17.0)
LYMPHS ABS: 3.8 10*3/uL (ref 0.7–4.0)
LYMPHS PCT: 44.9 % (ref 12.0–46.0)
MCHC: 33.4 g/dL (ref 30.0–36.0)
MCV: 93.9 fl (ref 78.0–100.0)
MONO ABS: 0.8 10*3/uL (ref 0.1–1.0)
Monocytes Relative: 9.5 % (ref 3.0–12.0)
NEUTROS ABS: 3.2 10*3/uL (ref 1.4–7.7)
Neutrophils Relative %: 37.5 % — ABNORMAL LOW (ref 43.0–77.0)
Platelets: 154 10*3/uL (ref 150.0–400.0)
RBC: 5.12 Mil/uL (ref 4.22–5.81)
RDW: 13.8 % (ref 11.5–15.5)
WBC: 8.6 10*3/uL (ref 4.0–10.5)

## 2016-10-11 LAB — HEPATIC FUNCTION PANEL
ALBUMIN: 4.4 g/dL (ref 3.5–5.2)
ALT: 28 U/L (ref 0–53)
AST: 21 U/L (ref 0–37)
Alkaline Phosphatase: 43 U/L (ref 39–117)
BILIRUBIN TOTAL: 0.7 mg/dL (ref 0.2–1.2)
Bilirubin, Direct: 0.2 mg/dL (ref 0.0–0.3)
Total Protein: 6.9 g/dL (ref 6.0–8.3)

## 2016-10-11 LAB — PSA: PSA: 0.94 ng/mL (ref 0.10–4.00)

## 2016-10-11 LAB — LIPID PANEL
Cholesterol: 130 mg/dL (ref 0–200)
HDL: 36.9 mg/dL — AB (ref >39.00)
LDL Cholesterol: 74 mg/dL (ref 0–99)
NonHDL: 92.6
TRIGLYCERIDES: 94 mg/dL (ref 0.0–149.0)
Total CHOL/HDL Ratio: 4
VLDL: 18.8 mg/dL (ref 0.0–40.0)

## 2016-10-11 LAB — TSH: TSH: 3.14 u[IU]/mL (ref 0.35–4.50)

## 2016-10-11 LAB — HEMOGLOBIN A1C: HEMOGLOBIN A1C: 6 % (ref 4.6–6.5)

## 2016-10-11 MED ORDER — PANTOPRAZOLE SODIUM 40 MG PO TBEC: 40.0000 mg | DELAYED_RELEASE_TABLET | Freq: Every day | ORAL | 3 refills | Status: DC

## 2016-10-11 MED ORDER — LISINOPRIL 5 MG PO TABS: 5.0000 mg | ORAL_TABLET | Freq: Two times a day (BID) | ORAL | 3 refills | Status: DC

## 2016-10-11 MED ORDER — CARVEDILOL 6.25 MG PO TABS: 6.2500 mg | ORAL_TABLET | Freq: Two times a day (BID) | ORAL | 3 refills | Status: DC

## 2016-10-11 MED ORDER — PNEUMOCOCCAL 13-VAL CONJ VACC IM SUSP: 0.5000 mL | INTRAMUSCULAR | Status: DC

## 2016-10-11 MED ORDER — CYCLOBENZAPRINE HCL 10 MG PO TABS: 10.0000 mg | ORAL_TABLET | Freq: Three times a day (TID) | ORAL | 0 refills | Status: DC | PRN

## 2016-10-11 MED ORDER — ATORVASTATIN CALCIUM 20 MG PO TABS: 20.0000 mg | ORAL_TABLET | Freq: Every day | ORAL | 3 refills | Status: DC
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
## 2016-10-11 NOTE — Assessment & Plan Note (Signed)
Improved with irrigation bilat

## 2016-10-11 NOTE — Progress Notes (Signed)
Pre visit review using our clinic review tool, if applicable. No additional management support is needed unless otherwise documented below in the visit note. 

## 2016-10-11 NOTE — Assessment & Plan Note (Signed)

## 2016-10-11 NOTE — Patient Instructions (Addendum)
You had the Prevnar pneumonia shot today  Your ears were irrigated of wax today  Please continue all other medications as before, and refills have been done if requested.  Please have the pharmacy call with any other refills you may need.  Please continue your efforts at being more active, low cholesterol diet, and weight control.  You are otherwise up to date with prevention measures today.  Please keep your appointments with your specialists as you may have planned  Please go to the LAB in the Basement (turn left off the elevator) for the tests to be done today  You will be contacted by phone if any changes need to be made immediately.  Otherwise, you will receive a letter about your results with an explanation, but please check with MyChart first.  Please remember to sign up for MyChart if you have not done so, as this will be important to you in the future with finding out test results, communicating by private email, and scheduling acute appointments online when needed.  If you have Medicare related insurance (such as traditional Medicare, [employer] or [employer], or similar), Please make an appointment at the [facility] with Sharee Pimple, the [employer], for your Wellness Visit in this office, which is a benefit with your insurance.  Please return in 6 months, or sooner if needed

## 2016-10-31 DIAGNOSIS — H04123 Dry eye syndrome of bilateral lacrimal glands: Secondary | ICD-10-CM | POA: Diagnosis not present

## 2016-10-31 DIAGNOSIS — Z961 Presence of intraocular lens: Secondary | ICD-10-CM | POA: Diagnosis not present

## 2016-10-31 DIAGNOSIS — H10413 Chronic giant papillary conjunctivitis, bilateral: Secondary | ICD-10-CM | POA: Diagnosis not present

## 2016-10-31 DIAGNOSIS — H401111 Primary open-angle glaucoma, right eye, mild stage: Secondary | ICD-10-CM | POA: Diagnosis not present

## 2016-10-31 DIAGNOSIS — H401122 Primary open-angle glaucoma, left eye, moderate stage: Secondary | ICD-10-CM | POA: Diagnosis not present

## 2016-11-08 DIAGNOSIS — Z08 Encounter for follow-up examination after completed treatment for malignant neoplasm: Secondary | ICD-10-CM | POA: Diagnosis not present

## 2016-11-08 DIAGNOSIS — L821 Other seborrheic keratosis: Secondary | ICD-10-CM | POA: Diagnosis not present

## 2016-11-08 DIAGNOSIS — Z85828 Personal history of other malignant neoplasm of skin: Secondary | ICD-10-CM | POA: Diagnosis not present

## 2016-12-08 ENCOUNTER — Encounter: Payer: Self-pay | Admitting: Neurology

## 2016-12-08 ENCOUNTER — Ambulatory Visit (INDEPENDENT_AMBULATORY_CARE_PROVIDER_SITE_OTHER): Payer: Medicare Other | Admitting: Neurology

## 2016-12-08 VITALS — BP 109/74 | HR 67 | Resp 18 | Ht 69.0 in | Wt 188.0 lb

## 2016-12-08 DIAGNOSIS — R413 Other amnesia: Secondary | ICD-10-CM

## 2016-12-08 DIAGNOSIS — R569 Unspecified convulsions: Secondary | ICD-10-CM

## 2016-12-08 DIAGNOSIS — M545 Low back pain, unspecified: Secondary | ICD-10-CM

## 2016-12-08 MED ORDER — DIVALPROEX SODIUM ER 500 MG PO TB24: 500.0000 mg | ORAL_TABLET | Freq: Two times a day (BID) | ORAL | 4 refills | Status: DC
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
## 2016-12-09 LAB — CBC WITH DIFFERENTIAL/PLATELET
Basophils Absolute: 0 10*3/uL (ref 0.0–0.2)
Basos: 1 %
EOS (ABSOLUTE): 0.6 10*3/uL — ABNORMAL HIGH (ref 0.0–0.4)
EOS: 8 %
HEMATOCRIT: 46.4 % (ref 37.5–51.0)
HEMOGLOBIN: 15.3 g/dL (ref 13.0–17.7)
IMMATURE GRANS (ABS): 0 10*3/uL (ref 0.0–0.1)
Immature Granulocytes: 0 %
LYMPHS ABS: 3.1 10*3/uL (ref 0.7–3.1)
Lymphs: 41 %
MCH: 31 pg (ref 26.6–33.0)
MCHC: 33 g/dL (ref 31.5–35.7)
MCV: 94 fL (ref 79–97)
MONOCYTES: 10 %
Monocytes Absolute: 0.8 10*3/uL (ref 0.1–0.9)
NEUTROS ABS: 2.9 10*3/uL (ref 1.4–7.0)
Neutrophils: 40 %
Platelets: 143 10*3/uL — ABNORMAL LOW (ref 150–379)
RBC: 4.93 x10E6/uL (ref 4.14–5.80)
RDW: 14.1 % (ref 12.3–15.4)
WBC: 7.4 10*3/uL (ref 3.4–10.8)

## 2016-12-09 LAB — HEPATIC FUNCTION PANEL
ALBUMIN: 4.3 g/dL (ref 3.5–4.8)
ALK PHOS: 50 IU/L (ref 39–117)
ALT: 21 IU/L (ref 0–44)
AST: 21 IU/L (ref 0–40)
BILIRUBIN, DIRECT: 0.26 mg/dL (ref 0.00–0.40)
Bilirubin Total: 0.7 mg/dL (ref 0.0–1.2)
Total Protein: 6.7 g/dL (ref 6.0–8.5)

## 2016-12-09 LAB — VALPROIC ACID LEVEL: Valproic Acid Lvl: 57 ug/mL (ref 50–100)

## 2016-12-12 ENCOUNTER — Telehealth: Payer: Self-pay | Admitting: *Deleted

## 2016-12-12 NOTE — Telephone Encounter (Signed)
I have spoken with pt. this morning and per RAS, advised that labs done in our ofifce look ok.  He verbalized understanding of same/fim

## 2016-12-12 NOTE — Telephone Encounter (Signed)
-----   Message from Britt Bottom, MD sent at 12/11/2016  4:24 PM EDT ----- Please let the patient know that the lab work is fine.

## 2017-03-21 ENCOUNTER — Ambulatory Visit (INDEPENDENT_AMBULATORY_CARE_PROVIDER_SITE_OTHER): Payer: Medicare Other | Admitting: Neurology

## 2017-03-21 ENCOUNTER — Encounter: Payer: Self-pay | Admitting: Neurology

## 2017-03-21 VITALS — BP 114/75 | HR 70 | Resp 20 | Ht 69.0 in | Wt 186.5 lb

## 2017-03-21 DIAGNOSIS — H8111 Benign paroxysmal vertigo, right ear: Secondary | ICD-10-CM

## 2017-03-21 DIAGNOSIS — M545 Low back pain, unspecified: Secondary | ICD-10-CM

## 2017-03-21 DIAGNOSIS — H9193 Unspecified hearing loss, bilateral: Secondary | ICD-10-CM

## 2017-03-21 DIAGNOSIS — H8110 Benign paroxysmal vertigo, unspecified ear: Secondary | ICD-10-CM | POA: Insufficient documentation

## 2017-03-21 DIAGNOSIS — R413 Other amnesia: Secondary | ICD-10-CM

## 2017-03-21 DIAGNOSIS — R569 Unspecified convulsions: Secondary | ICD-10-CM | POA: Diagnosis not present
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
## 2017-03-21 NOTE — Patient Instructions (Signed)
If the maneuver we did today does not help you, then do the vestibular  exercises we went over for 10 minutes 3 times a day.   If no better after 2 weeks, give [place] a call and we will set up an MRI.

## 2017-03-29 ENCOUNTER — Other Ambulatory Visit: Payer: Self-pay | Admitting: Physician Assistant

## 2017-03-29 DIAGNOSIS — I2510 Atherosclerotic heart disease of native coronary artery without angina pectoris: Secondary | ICD-10-CM

## 2017-04-17 ENCOUNTER — Ambulatory Visit: Payer: Medicare Other | Admitting: Internal Medicine

## 2017-04-30 ENCOUNTER — Encounter: Payer: Self-pay | Admitting: Internal Medicine

## 2017-04-30 ENCOUNTER — Ambulatory Visit (INDEPENDENT_AMBULATORY_CARE_PROVIDER_SITE_OTHER): Payer: Medicare Other | Admitting: Internal Medicine

## 2017-04-30 VITALS — BP 116/78 | HR 62 | Temp 97.9°F | Ht 69.0 in | Wt 188.0 lb

## 2017-04-30 DIAGNOSIS — R739 Hyperglycemia, unspecified: Secondary | ICD-10-CM | POA: Diagnosis not present

## 2017-04-30 DIAGNOSIS — F32A Depression, unspecified: Secondary | ICD-10-CM

## 2017-04-30 DIAGNOSIS — Z0000 Encounter for general adult medical examination without abnormal findings: Secondary | ICD-10-CM | POA: Diagnosis not present

## 2017-04-30 DIAGNOSIS — Z23 Encounter for immunization: Secondary | ICD-10-CM | POA: Diagnosis not present

## 2017-04-30 DIAGNOSIS — E785 Hyperlipidemia, unspecified: Secondary | ICD-10-CM

## 2017-04-30 DIAGNOSIS — F329 Major depressive disorder, single episode, unspecified: Secondary | ICD-10-CM | POA: Diagnosis not present

## 2017-04-30 DIAGNOSIS — I2510 Atherosclerotic heart disease of native coronary artery without angina pectoris: Secondary | ICD-10-CM

## 2017-04-30 MED ORDER — PANTOPRAZOLE SODIUM 40 MG PO TBEC: 40.0000 mg | DELAYED_RELEASE_TABLET | Freq: Every day | ORAL | 3 refills | Status: DC

## 2017-04-30 MED ORDER — ATORVASTATIN CALCIUM 20 MG PO TABS: 20.0000 mg | ORAL_TABLET | Freq: Every day | ORAL | 3 refills | Status: DC

## 2017-04-30 NOTE — Patient Instructions (Addendum)
You had the flu shot today  Please continue all other medications as before, and refills have been done if requested.  Please have the pharmacy call with any other refills you may need.  Please continue your efforts at being more active, low cholesterol diet, and weight control.  You are otherwise up to date with prevention measures today.  Please keep your appointments with your specialists as you may have planned  Please return in 6 months, or sooner if needed, with Lab testing done 3-5 days before  
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
## 2017-05-03 NOTE — Assessment & Plan Note (Signed)
Lab Results  Component Value Date   LDLCALC 74 10/11/2016  stable overall by history and exam, recent data reviewed with pt, and pt to continue medical treatment as before,  to f/u any worsening symptoms or concerns

## 2017-05-03 NOTE — Assessment & Plan Note (Signed)
stable overall by history and exam, and pt to continue medical treatment as before,  to f/u any worsening symptoms or concerns 

## 2017-05-03 NOTE — Assessment & Plan Note (Signed)
stable overall by history and exam, recent data reviewed with pt, and pt to continue medical treatment as before,  to f/u any worsening symptoms or concerns Lab Results  Component Value Date   HGBA1C 6.0 10/11/2016

## 2017-05-04 DIAGNOSIS — H01021 Squamous blepharitis right upper eyelid: Secondary | ICD-10-CM | POA: Diagnosis not present

## 2017-05-04 DIAGNOSIS — H401122 Primary open-angle glaucoma, left eye, moderate stage: Secondary | ICD-10-CM | POA: Diagnosis not present

## 2017-05-04 DIAGNOSIS — H04123 Dry eye syndrome of bilateral lacrimal glands: Secondary | ICD-10-CM | POA: Diagnosis not present

## 2017-05-04 DIAGNOSIS — H401111 Primary open-angle glaucoma, right eye, mild stage: Secondary | ICD-10-CM | POA: Diagnosis not present

## 2017-05-04 DIAGNOSIS — H10413 Chronic giant papillary conjunctivitis, bilateral: Secondary | ICD-10-CM | POA: Diagnosis not present

## 2017-06-13 ENCOUNTER — Other Ambulatory Visit: Payer: Self-pay | Admitting: Physician Assistant

## 2017-06-13 DIAGNOSIS — I2510 Atherosclerotic heart disease of native coronary artery without angina pectoris: Secondary | ICD-10-CM

## 2017-06-17 ENCOUNTER — Other Ambulatory Visit: Payer: Self-pay | Admitting: Physician Assistant

## 2017-07-20 ENCOUNTER — Ambulatory Visit: Payer: Medicare Other | Admitting: Physician Assistant
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
## 2017-07-25 ENCOUNTER — Ambulatory Visit: Payer: Medicare Other | Admitting: Physician Assistant

## 2017-07-25 ENCOUNTER — Encounter (INDEPENDENT_AMBULATORY_CARE_PROVIDER_SITE_OTHER): Payer: Self-pay

## 2017-07-25 ENCOUNTER — Encounter: Payer: Self-pay | Admitting: Physician Assistant

## 2017-07-25 ENCOUNTER — Ambulatory Visit (INDEPENDENT_AMBULATORY_CARE_PROVIDER_SITE_OTHER): Payer: Medicare Other

## 2017-07-25 VITALS — BP 82/64 | HR 77 | Ht 69.0 in | Wt 184.4 lb

## 2017-07-25 DIAGNOSIS — E785 Hyperlipidemia, unspecified: Secondary | ICD-10-CM

## 2017-07-25 DIAGNOSIS — I2510 Atherosclerotic heart disease of native coronary artery without angina pectoris: Secondary | ICD-10-CM

## 2017-07-25 DIAGNOSIS — I493 Ventricular premature depolarization: Secondary | ICD-10-CM | POA: Diagnosis not present

## 2017-07-25 DIAGNOSIS — I420 Dilated cardiomyopathy: Secondary | ICD-10-CM | POA: Diagnosis not present

## 2017-07-25 DIAGNOSIS — I5022 Chronic systolic (congestive) heart failure: Secondary | ICD-10-CM

## 2017-07-25 LAB — BASIC METABOLIC PANEL
BUN / CREAT RATIO: 19 (ref 10–24)
BUN: 21 mg/dL (ref 8–27)
CALCIUM: 9.5 mg/dL (ref 8.6–10.2)
CHLORIDE: 102 mmol/L (ref 96–106)
CO2: 25 mmol/L (ref 20–29)
Creatinine, Ser: 1.11 mg/dL (ref 0.76–1.27)
GFR calc Af Amer: 73 mL/min/{1.73_m2} (ref >59)
GFR calc non Af Amer: 63 mL/min/{1.73_m2} (ref >59)
GLUCOSE: 114 mg/dL — AB (ref 65–99)
Potassium: 4.9 mmol/L (ref 3.5–5.2)
Sodium: 141 mmol/L (ref 134–144)

## 2017-07-25 LAB — MAGNESIUM: MAGNESIUM: 2 mg/dL (ref 1.6–2.3)

## 2017-07-25 MED ORDER — LISINOPRIL 5 MG PO TABS: 5.0000 mg | ORAL_TABLET | Freq: Two times a day (BID) | ORAL | 3 refills | Status: DC

## 2017-07-25 MED ORDER — CARVEDILOL 6.25 MG PO TABS: 6.2500 mg | ORAL_TABLET | Freq: Two times a day (BID) | ORAL | 3 refills | Status: DC

## 2017-07-25 NOTE — Patient Instructions (Signed)
Medication Instructions:  1. DECREASE LISINOPRIL TO 5 MG DAILY; REFILLS HAS BEEN SENT IN FOR  # 180 X 3   2. REFILL SENT IN FOR COREG # 180 X 3    Labwork: TODAY BMET, CBC  Testing/Procedures: 1. Your physician has requested that you have an echocardiogram. Echocardiography is a painless test that uses sound waves to create images of your heart. It provides your doctor with information about the size and shape of your heart and how well your heart's chambers and valves are working. This procedure takes approximately one hour. There are no restrictions for this procedure.  2. Your physician has recommended that you wear a 24 HOUR holter monitor. Holter monitors are medical devices that record the heart's electrical activity. Doctors most often use these monitors to diagnose arrhythmias. Arrhythmias are problems with the speed or rhythm of the heartbeat. The monitor is a small, portable device. You can wear one while you do your normal daily activities. This is usually used to diagnose what is causing palpitations/syncope (passing out).    Follow-Up: Your physician wants you to follow-up in: 6 MONTHS WITH DR. END  You will receive a reminder letter in the mail two months in advance. If you don't receive a letter, please call our office to schedule the follow-up appointment.   Any Other Special Instructions Will Be Listed Below (If Applicable).  MONITOR BLOOD PRESSURE A FEW TIMES A WEEK AND CALL IF YOUR BLOOD PRESSURE IS 140/90 OR HIGHER   If you need a refill on your cardiac medications before your next appointment, please call your pharmacy.

## 2017-08-01 ENCOUNTER — Ambulatory Visit (HOSPITAL_COMMUNITY): Payer: Medicare Other | Attending: Cardiovascular Disease

## 2017-08-01 ENCOUNTER — Other Ambulatory Visit: Payer: Self-pay

## 2017-08-01 DIAGNOSIS — I2510 Atherosclerotic heart disease of native coronary artery without angina pectoris: Secondary | ICD-10-CM | POA: Diagnosis not present

## 2017-08-01 DIAGNOSIS — I371 Nonrheumatic pulmonary valve insufficiency: Secondary | ICD-10-CM | POA: Insufficient documentation

## 2017-08-01 DIAGNOSIS — I509 Heart failure, unspecified: Secondary | ICD-10-CM | POA: Insufficient documentation

## 2017-08-01 DIAGNOSIS — I071 Rheumatic tricuspid insufficiency: Secondary | ICD-10-CM | POA: Diagnosis not present

## 2017-08-01 DIAGNOSIS — I420 Dilated cardiomyopathy: Secondary | ICD-10-CM | POA: Diagnosis not present

## 2017-08-01 DIAGNOSIS — E785 Hyperlipidemia, unspecified: Secondary | ICD-10-CM | POA: Diagnosis not present

## 2017-08-01 DIAGNOSIS — I77810 Thoracic aortic ectasia: Secondary | ICD-10-CM | POA: Diagnosis not present

## 2017-08-01 MED ORDER — PERFLUTREN LIPID MICROSPHERE
1.0000 mL | INTRAVENOUS | Status: AC | PRN
  Administered 2017-08-01: 3 mL via INTRAVENOUS

## 2017-08-03 ENCOUNTER — Encounter: Payer: Self-pay | Admitting: Physician Assistant

## 2017-08-03 ENCOUNTER — Telehealth: Payer: Self-pay | Admitting: *Deleted

## 2017-08-03 NOTE — Telephone Encounter (Signed)
-----   Message from Liliane Shi, [place] sent at 08/03/2017  1:49 PM EST ----- Please call the patient. The Holter monitor shows normal sinus rhythm and frequent PVCs (11.7%).  This is somewhat more PVCs than what he had documented after his ablation.   Continue current dose of beta-blocker.  I have asked Dr. Thompson Grayer to review the study as well.  I will touch base with Mr. Pigeon if he thinks we should do anything else. Please route a copy of this study result to his PCP:  Biagio Borg, MD  Richardson Dopp, PA-C    08/03/2017 1:47 PM

## 2017-08-03 NOTE — Telephone Encounter (Signed)
-----   Message from Liliane Shi, [place] sent at 08/03/2017  1:50 PM EST ----- Please call the patient. Echocardiogram demonstrates stable LV function with an EF of 45-50%.  This is similar to his previous study done in 2016. Please route a copy of this study result to his PCP:  Biagio Borg, MD  Richardson Dopp, PA-C    08/03/2017 1:49 PM

## 2017-08-03 NOTE — Telephone Encounter (Signed)
Patient informed and verbalized understanding. Copy sent to PCP 

## 2017-08-06 DIAGNOSIS — X32XXXD Exposure to sunlight, subsequent encounter: Secondary | ICD-10-CM | POA: Diagnosis not present

## 2017-08-06 DIAGNOSIS — L820 Inflamed seborrheic keratosis: Secondary | ICD-10-CM | POA: Diagnosis not present

## 2017-08-06 DIAGNOSIS — L570 Actinic keratosis: Secondary | ICD-10-CM | POA: Diagnosis not present

## 2017-08-21 ENCOUNTER — Telehealth: Payer: Self-pay | Admitting: *Deleted

## 2017-08-21 NOTE — Telephone Encounter (Signed)
DPR ok to s/w pt's wife. Pt's wife has been notified of holter monitor. Pt has been scheduled to see Dr. Rayann Heman 11/21/17 @ 9:30.

## 2017-08-21 NOTE — Telephone Encounter (Signed)
-----   Message from Liliane Shi, [place] sent at 08/20/2017  5:05 PM EST ----- Please call the patient. I did review his Holter with Dr. Thompson Grayer.  No further recommendations regarding his PVCs since his echocardiogram showed that his ejection fraction was unchanged.  He did recommend that he see him in follow up in 3 months. Continue current medications. Please arrange follow up with Dr. Thompson Grayer in 3 months (11/2017). Richardson Dopp, PA-C    08/20/2017 5:03 PM

## 2017-08-21 NOTE — Telephone Encounter (Signed)
Left message to go over Holter monitor results. Our office will arrange a follow up appt with Dr. Rayann Heman for 3 months

## 2017-08-21 NOTE — Telephone Encounter (Signed)
follow up  Pt wife returning call for nurse about monitor result

## 2017-11-01 ENCOUNTER — Ambulatory Visit (INDEPENDENT_AMBULATORY_CARE_PROVIDER_SITE_OTHER): Payer: Medicare Other | Admitting: Internal Medicine

## 2017-11-01 ENCOUNTER — Encounter: Payer: Self-pay | Admitting: Internal Medicine

## 2017-11-01 ENCOUNTER — Other Ambulatory Visit (INDEPENDENT_AMBULATORY_CARE_PROVIDER_SITE_OTHER): Payer: Medicare Other

## 2017-11-01 VITALS — BP 102/70 | HR 97 | Temp 98.1°F | Ht 69.0 in | Wt 185.5 lb

## 2017-11-01 DIAGNOSIS — R569 Unspecified convulsions: Secondary | ICD-10-CM

## 2017-11-01 DIAGNOSIS — Z0000 Encounter for general adult medical examination without abnormal findings: Secondary | ICD-10-CM | POA: Diagnosis not present

## 2017-11-01 DIAGNOSIS — R739 Hyperglycemia, unspecified: Secondary | ICD-10-CM

## 2017-11-01 DIAGNOSIS — H401122 Primary open-angle glaucoma, left eye, moderate stage: Secondary | ICD-10-CM | POA: Diagnosis not present

## 2017-11-01 DIAGNOSIS — H401111 Primary open-angle glaucoma, right eye, mild stage: Secondary | ICD-10-CM | POA: Diagnosis not present

## 2017-11-01 LAB — HEPATIC FUNCTION PANEL
ALT: 29 U/L (ref 0–53)
AST: 18 U/L (ref 0–37)
Albumin: 4 g/dL (ref 3.5–5.2)
Alkaline Phosphatase: 37 U/L — ABNORMAL LOW (ref 39–117)
BILIRUBIN DIRECT: 0.2 mg/dL (ref 0.0–0.3)
BILIRUBIN TOTAL: 0.6 mg/dL (ref 0.2–1.2)
Total Protein: 6.6 g/dL (ref 6.0–8.3)

## 2017-11-01 LAB — LIPID PANEL
CHOL/HDL RATIO: 3
Cholesterol: 127 mg/dL (ref 0–200)
HDL: 41.7 mg/dL (ref >39.00)
LDL CALC: 49 mg/dL (ref 0–99)
NONHDL: 85.61
Triglycerides: 182 mg/dL — ABNORMAL HIGH (ref 0.0–149.0)
VLDL: 36.4 mg/dL (ref 0.0–40.0)

## 2017-11-01 LAB — CBC WITH DIFFERENTIAL/PLATELET
BASOS PCT: 0.7 % (ref 0.0–3.0)
Basophils Absolute: 0.1 10*3/uL (ref 0.0–0.1)
EOS PCT: 6 % — AB (ref 0.0–5.0)
Eosinophils Absolute: 0.6 10*3/uL (ref 0.0–0.7)
HCT: 45.9 % (ref 39.0–52.0)
Hemoglobin: 15.7 g/dL (ref 13.0–17.0)
LYMPHS ABS: 3.7 10*3/uL (ref 0.7–4.0)
Lymphocytes Relative: 39.9 % (ref 12.0–46.0)
MCHC: 34.2 g/dL (ref 30.0–36.0)
MCV: 93.6 fl (ref 78.0–100.0)
MONO ABS: 0.7 10*3/uL (ref 0.1–1.0)
Monocytes Relative: 7.8 % (ref 3.0–12.0)
NEUTROS PCT: 45.6 % (ref 43.0–77.0)
Neutro Abs: 4.2 10*3/uL (ref 1.4–7.7)
Platelets: 153 10*3/uL (ref 150.0–400.0)
RBC: 4.9 Mil/uL (ref 4.22–5.81)
RDW: 14 % (ref 11.5–15.5)
WBC: 9.2 10*3/uL (ref 4.0–10.5)

## 2017-11-01 LAB — URINALYSIS, ROUTINE W REFLEX MICROSCOPIC
Bilirubin Urine: NEGATIVE
Hgb urine dipstick: NEGATIVE
KETONES UR: 15 — AB
LEUKOCYTES UA: NEGATIVE
Nitrite: NEGATIVE
RBC / HPF: NONE SEEN (ref 0–?)
SPECIFIC GRAVITY, URINE: 1.025 (ref 1.000–1.030)
Total Protein, Urine: NEGATIVE
URINE GLUCOSE: NEGATIVE
UROBILINOGEN UA: 1 (ref 0.0–1.0)
pH: 6 (ref 5.0–8.0)

## 2017-11-01 LAB — BASIC METABOLIC PANEL
BUN: 29 mg/dL — ABNORMAL HIGH (ref 6–23)
CHLORIDE: 103 meq/L (ref 96–112)
CO2: 31 meq/L (ref 19–32)
Calcium: 9.4 mg/dL (ref 8.4–10.5)
Creatinine, Ser: 1.31 mg/dL (ref 0.40–1.50)
GFR: 56.15 mL/min — ABNORMAL LOW (ref >60.00)
GLUCOSE: 123 mg/dL — AB (ref 70–99)
POTASSIUM: 4.6 meq/L (ref 3.5–5.1)
SODIUM: 141 meq/L (ref 135–145)

## 2017-11-01 LAB — HEMOGLOBIN A1C: Hgb A1c MFr Bld: 6 % (ref 4.6–6.5)

## 2017-11-01 LAB — PSA: PSA: 0.71 ng/mL (ref 0.10–4.00)

## 2017-11-01 LAB — TSH: TSH: 2.21 u[IU]/mL (ref 0.35–4.50)

## 2017-11-01 NOTE — Patient Instructions (Signed)
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
## 2017-11-02 LAB — VALPROIC ACID LEVEL: Valproic Acid Lvl: 72.4 mg/L (ref 50.0–100.0)

## 2017-11-03 NOTE — Assessment & Plan Note (Signed)
Also for valproic acid level

## 2017-11-03 NOTE — Assessment & Plan Note (Signed)

## 2017-11-03 NOTE — Assessment & Plan Note (Signed)
stable overall by history and exam, recent data reviewed with pt, and pt to continue medical treatment as before,  to f/u any worsening symptoms or concerns \ Lab Results  Component Value Date   HGBA1C 6.0 11/01/2017

## 2017-11-21 ENCOUNTER — Encounter: Payer: Self-pay | Admitting: Internal Medicine

## 2017-11-21 ENCOUNTER — Ambulatory Visit: Payer: Medicare Other | Admitting: Internal Medicine

## 2017-11-21 VITALS — BP 118/62 | HR 66 | Ht 69.0 in | Wt 185.0 lb

## 2017-11-21 DIAGNOSIS — I493 Ventricular premature depolarization: Secondary | ICD-10-CM | POA: Diagnosis not present

## 2017-11-21 DIAGNOSIS — I420 Dilated cardiomyopathy: Secondary | ICD-10-CM | POA: Diagnosis not present

## 2017-11-21 DIAGNOSIS — I5022 Chronic systolic (congestive) heart failure: Secondary | ICD-10-CM | POA: Diagnosis not present

## 2017-11-21 NOTE — Patient Instructions (Addendum)
Medication Instructions:  Your physician recommends that you continue on your current medications as directed. Please refer to the Current Medication list given to you today.  Labwork: None ordered.  Testing/Procedures: None ordered.  Follow-Up:  Schedule appointment with Dr. Saunders Revel for August 2019.  Your physician wants you to follow-up in: as needed with Dr. Rayann Heman.     Any Other Special Instructions Will Be Listed Below (If Applicable).  If you need a refill on your cardiac medications before your next appointment, please call your pharmacy.
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
## 2017-12-10 ENCOUNTER — Other Ambulatory Visit: Payer: Self-pay

## 2017-12-10 ENCOUNTER — Encounter: Payer: Self-pay | Admitting: Neurology

## 2017-12-10 ENCOUNTER — Other Ambulatory Visit: Payer: Self-pay | Admitting: Neurology

## 2017-12-10 ENCOUNTER — Ambulatory Visit: Payer: Medicare Other | Admitting: Neurology

## 2017-12-10 VITALS — BP 107/71 | HR 70 | Resp 18 | Ht 69.0 in | Wt 183.0 lb

## 2017-12-10 DIAGNOSIS — R569 Unspecified convulsions: Secondary | ICD-10-CM

## 2017-12-10 DIAGNOSIS — H8111 Benign paroxysmal vertigo, right ear: Secondary | ICD-10-CM

## 2017-12-10 DIAGNOSIS — R413 Other amnesia: Secondary | ICD-10-CM | POA: Diagnosis not present

## 2017-12-10 MED ORDER — DIVALPROEX SODIUM ER 500 MG PO TB24: 500.0000 mg | ORAL_TABLET | Freq: Two times a day (BID) | ORAL | 0 refills | Status: DC
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
## 2017-12-11 LAB — COMPREHENSIVE METABOLIC PANEL
ALT: 27 IU/L (ref 0–44)
AST: 20 IU/L (ref 0–40)
Albumin/Globulin Ratio: 1.6 (ref 1.2–2.2)
Albumin: 4.1 g/dL (ref 3.5–4.8)
Alkaline Phosphatase: 40 IU/L (ref 39–117)
BUN/Creatinine Ratio: 19 (ref 10–24)
BUN: 23 mg/dL (ref 8–27)
Bilirubin Total: 0.8 mg/dL (ref 0.0–1.2)
CO2: 23 mmol/L (ref 20–29)
Calcium: 9.7 mg/dL (ref 8.6–10.2)
Chloride: 101 mmol/L (ref 96–106)
Creatinine, Ser: 1.2 mg/dL (ref 0.76–1.27)
GFR calc Af Amer: 67 mL/min/{1.73_m2} (ref >59)
GFR calc non Af Amer: 58 mL/min/{1.73_m2} — ABNORMAL LOW (ref >59)
Globulin, Total: 2.6 g/dL (ref 1.5–4.5)
Glucose: 89 mg/dL (ref 65–99)
Potassium: 5 mmol/L (ref 3.5–5.2)
Sodium: 139 mmol/L (ref 134–144)
Total Protein: 6.7 g/dL (ref 6.0–8.5)

## 2017-12-11 LAB — CBC WITH DIFFERENTIAL/PLATELET
BASOS: 1 %
Basophils Absolute: 0.1 10*3/uL (ref 0.0–0.2)
EOS (ABSOLUTE): 0.4 10*3/uL (ref 0.0–0.4)
Eos: 5 %
HEMATOCRIT: 47.6 % (ref 37.5–51.0)
Hemoglobin: 15.4 g/dL (ref 13.0–17.7)
IMMATURE GRANULOCYTES: 1 %
Immature Grans (Abs): 0 10*3/uL (ref 0.0–0.1)
Lymphocytes Absolute: 3 10*3/uL (ref 0.7–3.1)
Lymphs: 40 %
MCH: 30.7 pg (ref 26.6–33.0)
MCHC: 32.4 g/dL (ref 31.5–35.7)
MCV: 95 fL (ref 79–97)
MONOS ABS: 0.9 10*3/uL (ref 0.1–0.9)
Monocytes: 11 %
NEUTROS ABS: 3.2 10*3/uL (ref 1.4–7.0)
Neutrophils: 42 %
PLATELETS: 143 10*3/uL — AB (ref 150–450)
RBC: 5.01 x10E6/uL (ref 4.14–5.80)
RDW: 14.5 % (ref 12.3–15.4)
WBC: 7.6 10*3/uL (ref 3.4–10.8)

## 2017-12-11 LAB — VALPROIC ACID LEVEL: Valproic Acid Lvl: 48 ug/mL — ABNORMAL LOW (ref 50–100)

## 2017-12-12 ENCOUNTER — Telehealth: Payer: Self-pay | Admitting: *Deleted

## 2017-12-12 NOTE — Telephone Encounter (Signed)
Spoke with Gary Atkins and reviewed below lab result.  He verbalized understanding of same/fim

## 2017-12-12 NOTE — Telephone Encounter (Signed)
-----   Message from Britt Bottom, MD sent at 12/11/2017  5:53 PM EDT ----- Please let the patient know that the lab work is fine.

## 2018-01-31 ENCOUNTER — Encounter: Payer: Self-pay | Admitting: Internal Medicine

## 2018-01-31 ENCOUNTER — Ambulatory Visit: Payer: Medicare Other | Admitting: Internal Medicine

## 2018-01-31 ENCOUNTER — Encounter (INDEPENDENT_AMBULATORY_CARE_PROVIDER_SITE_OTHER): Payer: Self-pay

## 2018-01-31 VITALS — BP 102/80 | HR 66 | Ht 69.0 in | Wt 187.8 lb

## 2018-01-31 DIAGNOSIS — I5022 Chronic systolic (congestive) heart failure: Secondary | ICD-10-CM | POA: Diagnosis not present

## 2018-01-31 DIAGNOSIS — I493 Ventricular premature depolarization: Secondary | ICD-10-CM | POA: Diagnosis not present

## 2018-01-31 DIAGNOSIS — I2510 Atherosclerotic heart disease of native coronary artery without angina pectoris: Secondary | ICD-10-CM | POA: Diagnosis not present

## 2018-01-31 DIAGNOSIS — I428 Other cardiomyopathies: Secondary | ICD-10-CM
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
## 2018-01-31 NOTE — Patient Instructions (Addendum)
Medication Instructions:  Your physician recommends that you continue on your current medications as directed. Please refer to the Current Medication list given to you today.  -- If you need a refill on your cardiac medications before your next appointment, please call your pharmacy. --  Labwork: None ordered  Testing/Procedures: None ordered  Follow-Up: Your physician wants you to follow-up in: 6 months with Dr. Harrell Gave in [facility].   You will receive a reminder letter in the mail two months in advance. If you don't receive a letter, please call our office to schedule the follow-up appointment.  Thank you for choosing [facility]!!    Any Other Special Instructions Will Be Listed Below (If Applicable).

## 2018-02-02 ENCOUNTER — Encounter: Payer: Self-pay | Admitting: Internal Medicine

## 2018-02-02 DIAGNOSIS — I2510 Atherosclerotic heart disease of native coronary artery without angina pectoris: Secondary | ICD-10-CM | POA: Insufficient documentation

## 2018-02-13 DIAGNOSIS — C4441 Basal cell carcinoma of skin of scalp and neck: Secondary | ICD-10-CM | POA: Diagnosis not present

## 2018-02-13 DIAGNOSIS — C4442 Squamous cell carcinoma of skin of scalp and neck: Secondary | ICD-10-CM | POA: Diagnosis not present

## 2018-03-05 DIAGNOSIS — Z85828 Personal history of other malignant neoplasm of skin: Secondary | ICD-10-CM | POA: Diagnosis not present

## 2018-03-05 DIAGNOSIS — C4441 Basal cell carcinoma of skin of scalp and neck: Secondary | ICD-10-CM | POA: Diagnosis not present

## 2018-03-05 DIAGNOSIS — Z08 Encounter for follow-up examination after completed treatment for malignant neoplasm: Secondary | ICD-10-CM | POA: Diagnosis not present

## 2018-03-23 ENCOUNTER — Other Ambulatory Visit: Payer: Self-pay

## 2018-03-23 ENCOUNTER — Emergency Department (HOSPITAL_COMMUNITY)
Admission: EM | Admit: 2018-03-23 | Discharge: 2018-03-24 | Disposition: A | Discharge status: Home / Self Care | Payer: Medicare Other | Attending: Emergency Medicine | Admitting: Emergency Medicine

## 2018-03-23 ENCOUNTER — Encounter (HOSPITAL_COMMUNITY): Payer: Self-pay | Admitting: Emergency Medicine

## 2018-03-23 ENCOUNTER — Emergency Department (HOSPITAL_COMMUNITY): Payer: Medicare Other

## 2018-03-23 DIAGNOSIS — I2510 Atherosclerotic heart disease of native coronary artery without angina pectoris: Secondary | ICD-10-CM | POA: Insufficient documentation

## 2018-03-23 DIAGNOSIS — N201 Calculus of ureter: Secondary | ICD-10-CM | POA: Diagnosis not present

## 2018-03-23 DIAGNOSIS — Z7982 Long term (current) use of aspirin: Secondary | ICD-10-CM | POA: Diagnosis not present

## 2018-03-23 DIAGNOSIS — R1030 Lower abdominal pain, unspecified: Secondary | ICD-10-CM | POA: Diagnosis present

## 2018-03-23 DIAGNOSIS — R319 Hematuria, unspecified: Secondary | ICD-10-CM | POA: Insufficient documentation

## 2018-03-23 DIAGNOSIS — Z79899 Other long term (current) drug therapy: Secondary | ICD-10-CM | POA: Insufficient documentation

## 2018-03-23 DIAGNOSIS — N202 Calculus of kidney with calculus of ureter: Secondary | ICD-10-CM | POA: Diagnosis not present

## 2018-03-23 LAB — CBC
HCT: 49.2 % (ref 39.0–52.0)
Hemoglobin: 17 g/dL (ref 13.0–17.0)
MCH: 32.5 pg (ref 26.0–34.0)
MCHC: 34.6 g/dL (ref 30.0–36.0)
MCV: 94.1 fL (ref 78.0–100.0)
PLATELETS: 174 10*3/uL (ref 150–400)
RBC: 5.23 MIL/uL (ref 4.22–5.81)
RDW: 13.3 % (ref 11.5–15.5)
WBC: 10.9 10*3/uL — AB (ref 4.0–10.5)

## 2018-03-23 LAB — COMPREHENSIVE METABOLIC PANEL
ALBUMIN: 4.1 g/dL (ref 3.5–5.0)
ALT: 44 U/L (ref 0–44)
AST: 34 U/L (ref 15–41)
Alkaline Phosphatase: 42 U/L (ref 38–126)
Anion gap: 11 (ref 5–15)
BUN: 31 mg/dL — AB (ref 8–23)
CO2: 26 mmol/L (ref 22–32)
CREATININE: 1.44 mg/dL — AB (ref 0.61–1.24)
Calcium: 9.9 mg/dL (ref 8.9–10.3)
Chloride: 100 mmol/L (ref 98–111)
GFR calc non Af Amer: 45 mL/min — ABNORMAL LOW (ref >60)
GFR, EST AFRICAN AMERICAN: 52 mL/min — AB (ref >60)
GLUCOSE: 165 mg/dL — AB (ref 70–99)
Potassium: 4.6 mmol/L (ref 3.5–5.1)
SODIUM: 137 mmol/L (ref 135–145)
Total Bilirubin: 0.9 mg/dL (ref 0.3–1.2)
Total Protein: 7.3 g/dL (ref 6.5–8.1)

## 2018-03-23 LAB — LIPASE, BLOOD: LIPASE: 45 U/L (ref 11–51)

## 2018-03-23 MED ORDER — ONDANSETRON HCL 4 MG/2ML IJ SOLN: 4.0000 mg | Freq: Once | INTRAMUSCULAR | Status: DC | PRN

## 2018-03-23 MED ORDER — MORPHINE SULFATE (PF) 4 MG/ML IV SOLN
4.0000 mg | Freq: Once | INTRAVENOUS | Status: AC
  Administered 2018-03-23: 4 mg via INTRAVENOUS
  Filled 2018-03-23: qty 1

## 2018-03-23 MED ORDER — ONDANSETRON HCL 4 MG/2ML IJ SOLN
4.0000 mg | Freq: Once | INTRAMUSCULAR | Status: AC
  Administered 2018-03-23: 4 mg via INTRAVENOUS
  Filled 2018-03-23: qty 2

## 2018-03-23 NOTE — ED Triage Notes (Signed)
Pt reported LLQ abd pain that started 2 hr ago. Pt having vomiting at time of triage.
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
## 2018-03-24 LAB — URINALYSIS, ROUTINE W REFLEX MICROSCOPIC
BACTERIA UA: NONE SEEN
BILIRUBIN URINE: NEGATIVE
GLUCOSE, UA: NEGATIVE mg/dL
Ketones, ur: 20 mg/dL — AB
LEUKOCYTES UA: NEGATIVE
NITRITE: NEGATIVE
PH: 5 (ref 5.0–8.0)
Protein, ur: 30 mg/dL — AB
RBC / HPF: >50 RBC/hpf — ABNORMAL HIGH (ref 0–5)
SPECIFIC GRAVITY, URINE: 1.02 (ref 1.005–1.030)

## 2018-03-24 MED ORDER — OXYCODONE-ACETAMINOPHEN 5-325 MG PO TABS
1.0000 | ORAL_TABLET | Freq: Once | ORAL | Status: AC
  Administered 2018-03-24: 1 via ORAL
  Filled 2018-03-24: qty 1

## 2018-03-24 MED ORDER — TAMSULOSIN HCL 0.4 MG PO CAPS: 0.4000 mg | ORAL_CAPSULE | Freq: Two times a day (BID) | ORAL | 0 refills | Status: DC

## 2018-03-24 MED ORDER — ONDANSETRON 4 MG PO TBDP: 4.0000 mg | ORAL_TABLET | Freq: Three times a day (TID) | ORAL | 0 refills | Status: DC | PRN

## 2018-03-24 MED ORDER — TAMSULOSIN HCL 0.4 MG PO CAPS: 0.4000 mg | ORAL_CAPSULE | Freq: Every day | ORAL | 0 refills | Status: DC

## 2018-03-24 MED ORDER — OXYCODONE-ACETAMINOPHEN 5-325 MG PO TABS: 1.0000 | ORAL_TABLET | ORAL | 0 refills | Status: DC | PRN

## 2018-03-24 NOTE — Discharge Instructions (Signed)
As we discussed, you have a kidney stone on your left side.  Urine did not show any signs of infection. Take the prescribed medication as directed for symptom control.  Make sure to drink lots of fluids. Follow-up with urology-- call for appt. Return to the ED for new or worsening symptoms.

## 2018-05-08 DIAGNOSIS — H01025 Squamous blepharitis left lower eyelid: Secondary | ICD-10-CM | POA: Diagnosis not present

## 2018-05-08 DIAGNOSIS — H01022 Squamous blepharitis right lower eyelid: Secondary | ICD-10-CM | POA: Diagnosis not present

## 2018-05-08 DIAGNOSIS — H01024 Squamous blepharitis left upper eyelid: Secondary | ICD-10-CM | POA: Diagnosis not present

## 2018-05-08 DIAGNOSIS — H401131 Primary open-angle glaucoma, bilateral, mild stage: Secondary | ICD-10-CM | POA: Diagnosis not present

## 2018-05-08 DIAGNOSIS — H01021 Squamous blepharitis right upper eyelid: Secondary | ICD-10-CM | POA: Diagnosis not present

## 2018-07-08 ENCOUNTER — Other Ambulatory Visit: Payer: Self-pay | Admitting: Internal Medicine

## 2018-07-08 DIAGNOSIS — I2510 Atherosclerotic heart disease of native coronary artery without angina pectoris: Secondary | ICD-10-CM

## 2018-07-23 ENCOUNTER — Other Ambulatory Visit: Payer: Self-pay | Admitting: Physician Assistant

## 2018-07-23 MED ORDER — LISINOPRIL 5 MG PO TABS: 5.0000 mg | ORAL_TABLET | Freq: Every day | ORAL | 0 refills | Status: DC

## 2018-07-24 ENCOUNTER — Other Ambulatory Visit: Payer: Self-pay | Admitting: Physician Assistant

## 2018-07-24 MED ORDER — CARVEDILOL 6.25 MG PO TABS: 6.2500 mg | ORAL_TABLET | Freq: Two times a day (BID) | ORAL | 2 refills | Status: DC

## 2018-08-08 ENCOUNTER — Ambulatory Visit: Payer: Medicare Other | Admitting: Cardiology

## 2018-08-27 ENCOUNTER — Ambulatory Visit: Payer: Medicare Other | Admitting: Cardiology

## 2018-08-27 ENCOUNTER — Encounter: Payer: Self-pay | Admitting: Cardiology

## 2018-08-27 VITALS — BP 120/70 | HR 68 | Ht 68.0 in | Wt 186.4 lb

## 2018-08-27 DIAGNOSIS — I428 Other cardiomyopathies: Secondary | ICD-10-CM

## 2018-08-27 DIAGNOSIS — I5042 Chronic combined systolic (congestive) and diastolic (congestive) heart failure: Secondary | ICD-10-CM | POA: Diagnosis not present

## 2018-08-27 DIAGNOSIS — I2510 Atherosclerotic heart disease of native coronary artery without angina pectoris: Secondary | ICD-10-CM | POA: Diagnosis not present

## 2018-08-27 DIAGNOSIS — Z7189 Other specified counseling: Secondary | ICD-10-CM | POA: Diagnosis not present

## 2018-08-27 DIAGNOSIS — I493 Ventricular premature depolarization: Secondary | ICD-10-CM
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
## 2018-08-27 NOTE — Patient Instructions (Signed)
Medication Instructions:  Your Physician recommend you continue on your current medication as directed.    If you need a refill on your cardiac medications before your next appointment, please call your pharmacy.   Lab work: None  Testing/Procedures: None  Follow-Up: At [facility], you and your health needs are our priority.  As part of our continuing mission to provide you with exceptional heart care, we have created designated Provider Care Teams.  These Care Teams include your primary Cardiologist (physician) and Advanced Practice Providers (APPs -  Physician Assistants and Nurse Practitioners) who all work together to provide you with the care you need, when you need it. You will need a follow up appointment in 1 years.  Please call our office 2 months in advance to schedule this appointment.  You may see Dr. Susa Raring or one of the following Advanced Practice Providers on your designated Care Team:   Rosaria Ferries, PA-C . Jory Sims, DNP, ANP

## 2018-09-23 ENCOUNTER — Telehealth: Payer: Self-pay | Admitting: Neurology

## 2018-09-23 MED ORDER — DIVALPROEX SODIUM ER 500 MG PO TB24: 500.0000 mg | ORAL_TABLET | Freq: Two times a day (BID) | ORAL | 0 refills | Status: DC

## 2018-09-23 NOTE — Telephone Encounter (Signed)
I -escribed refills as requested.

## 2018-09-23 NOTE — Telephone Encounter (Signed)
Pt is requesting refill for divalproex (DEPAKOTE ER) 500 MG 24 hr tablet sent to [facility]. Pt is out of town, he has 1 tablet left, he said there are travel restrictions and he is unable to come home.

## 2018-11-05 ENCOUNTER — Ambulatory Visit: Payer: Medicare Other | Admitting: Internal Medicine

## 2018-11-06 ENCOUNTER — Other Ambulatory Visit: Payer: Self-pay

## 2018-11-06 NOTE — Patient Outreach (Signed)
[facility]) Care Management  11/06/2018  Gary Atkins 1940/08/19 [id number]   Medication Adherence call to [name] HIPPA Compliant Voice message left with a call back number. Gary Atkins is showing past due on Lisinopril 5 mg under [place].  [place] Management Direct Dial [phone]  Fax [phone] [email or web address]

## 2018-11-19 ENCOUNTER — Other Ambulatory Visit: Payer: Self-pay | Admitting: Internal Medicine

## 2018-11-19 DIAGNOSIS — I2510 Atherosclerotic heart disease of native coronary artery without angina pectoris: Secondary | ICD-10-CM

## 2018-11-25 ENCOUNTER — Other Ambulatory Visit: Payer: Self-pay | Admitting: Physician Assistant

## 2018-12-09 ENCOUNTER — Other Ambulatory Visit: Payer: Self-pay

## 2018-12-09 NOTE — Patient Outreach (Signed)
[facility]) Care Management  12/09/2018  Juaquin Ludington Millay 1941-05-05 [id number]   Medication Adherence call to Mr. Nycholas Rayner [facility] Identifiers Verify spoke with patient she is past due on Lisinopril 5 mg patient explain he only taking 1 tablet daily not 1 tablet 2 times a day per doctor new instructions  patient has plenty at this time and does not need any at this time. Mr. Barretto is showing past due under [place].   [place] Management Direct Dial [phone]  Fax [phone] [email or web address]

## 2018-12-12 ENCOUNTER — Ambulatory Visit: Payer: Medicare Other | Admitting: Neurology

## 2018-12-17 ENCOUNTER — Encounter: Payer: Self-pay | Admitting: Neurology

## 2018-12-17 ENCOUNTER — Other Ambulatory Visit: Payer: Self-pay

## 2018-12-17 ENCOUNTER — Ambulatory Visit: Payer: Medicare Other | Admitting: Neurology

## 2018-12-17 VITALS — BP 104/69 | HR 60 | Temp 97.5°F | Ht 68.0 in | Wt 185.5 lb

## 2018-12-17 DIAGNOSIS — H9193 Unspecified hearing loss, bilateral: Secondary | ICD-10-CM | POA: Diagnosis not present

## 2018-12-17 DIAGNOSIS — M545 Low back pain, unspecified: Secondary | ICD-10-CM

## 2018-12-17 DIAGNOSIS — H8111 Benign paroxysmal vertigo, right ear: Secondary | ICD-10-CM

## 2018-12-17 DIAGNOSIS — R413 Other amnesia: Secondary | ICD-10-CM | POA: Diagnosis not present

## 2018-12-17 DIAGNOSIS — R569 Unspecified convulsions: Secondary | ICD-10-CM

## 2018-12-17 MED ORDER — DIVALPROEX SODIUM ER 500 MG PO TB24: 500.0000 mg | ORAL_TABLET | Freq: Two times a day (BID) | ORAL | 3 refills | Status: DC
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
## 2018-12-18 LAB — CBC WITH DIFFERENTIAL/PLATELET
Basophils Absolute: 0.1 10*3/uL (ref 0.0–0.2)
Basos: 1 %
EOS (ABSOLUTE): 0.4 10*3/uL (ref 0.0–0.4)
Eos: 5 %
Hematocrit: 49.1 % (ref 37.5–51.0)
Hemoglobin: 16.2 g/dL (ref 13.0–17.7)
Immature Grans (Abs): 0.1 10*3/uL (ref 0.0–0.1)
Immature Granulocytes: 1 %
Lymphocytes Absolute: 2.8 10*3/uL (ref 0.7–3.1)
Lymphs: 41 %
MCH: 31.6 pg (ref 26.6–33.0)
MCHC: 33 g/dL (ref 31.5–35.7)
MCV: 96 fL (ref 79–97)
Monocytes Absolute: 0.7 10*3/uL (ref 0.1–0.9)
Monocytes: 11 %
Neutrophils Absolute: 2.8 10*3/uL (ref 1.4–7.0)
Neutrophils: 41 %
Platelets: 153 10*3/uL (ref 150–450)
RBC: 5.12 x10E6/uL (ref 4.14–5.80)
RDW: 12.9 % (ref 11.6–15.4)
WBC: 6.8 10*3/uL (ref 3.4–10.8)

## 2018-12-18 LAB — COMPREHENSIVE METABOLIC PANEL
ALT: 25 IU/L (ref 0–44)
AST: 18 IU/L (ref 0–40)
Albumin/Globulin Ratio: 1.9 (ref 1.2–2.2)
Albumin: 4.4 g/dL (ref 3.7–4.7)
Alkaline Phosphatase: 46 IU/L (ref 39–117)
BUN/Creatinine Ratio: 19 (ref 10–24)
BUN: 21 mg/dL (ref 8–27)
Bilirubin Total: 0.9 mg/dL (ref 0.0–1.2)
CO2: 24 mmol/L (ref 20–29)
Calcium: 9.7 mg/dL (ref 8.6–10.2)
Chloride: 103 mmol/L (ref 96–106)
Creatinine, Ser: 1.12 mg/dL (ref 0.76–1.27)
GFR calc Af Amer: 72 mL/min/{1.73_m2} (ref >59)
GFR calc non Af Amer: 62 mL/min/{1.73_m2} (ref >59)
Globulin, Total: 2.3 g/dL (ref 1.5–4.5)
Glucose: 99 mg/dL (ref 65–99)
Potassium: 5.3 mmol/L — ABNORMAL HIGH (ref 3.5–5.2)
Sodium: 142 mmol/L (ref 134–144)
Total Protein: 6.7 g/dL (ref 6.0–8.5)

## 2018-12-18 LAB — VALPROIC ACID LEVEL: Valproic Acid Lvl: 59 ug/mL (ref 50–100)

## 2018-12-19 ENCOUNTER — Telehealth: Payer: Self-pay | Admitting: *Deleted

## 2018-12-19 NOTE — Telephone Encounter (Signed)
-----   Message from Britt Bottom, MD sent at 12/18/2018  5:08 PM EDT ----- Please let him know that the lab work looks good.  Continue the current dose of Depakote.

## 2018-12-19 NOTE — Telephone Encounter (Signed)
Called and spoke with pt. Relayed Dr. Garth Bigness message. He verbalized understanding.

## 2019-01-09 ENCOUNTER — Encounter: Payer: Self-pay | Admitting: Internal Medicine

## 2019-01-09 ENCOUNTER — Other Ambulatory Visit: Payer: Self-pay

## 2019-01-09 ENCOUNTER — Other Ambulatory Visit (INDEPENDENT_AMBULATORY_CARE_PROVIDER_SITE_OTHER): Payer: Medicare Other

## 2019-01-09 ENCOUNTER — Ambulatory Visit (INDEPENDENT_AMBULATORY_CARE_PROVIDER_SITE_OTHER): Payer: Medicare Other | Admitting: Internal Medicine

## 2019-01-09 VITALS — BP 116/78 | HR 70 | Temp 98.0°F | Ht 68.0 in | Wt 185.0 lb

## 2019-01-09 DIAGNOSIS — R739 Hyperglycemia, unspecified: Secondary | ICD-10-CM

## 2019-01-09 DIAGNOSIS — Z0000 Encounter for general adult medical examination without abnormal findings: Secondary | ICD-10-CM

## 2019-01-09 DIAGNOSIS — E559 Vitamin D deficiency, unspecified: Secondary | ICD-10-CM

## 2019-01-09 DIAGNOSIS — E611 Iron deficiency: Secondary | ICD-10-CM | POA: Diagnosis not present

## 2019-01-09 DIAGNOSIS — E538 Deficiency of other specified B group vitamins: Secondary | ICD-10-CM

## 2019-01-09 LAB — URINALYSIS, ROUTINE W REFLEX MICROSCOPIC
Bilirubin Urine: NEGATIVE
Hgb urine dipstick: NEGATIVE
Leukocytes,Ua: NEGATIVE
Nitrite: NEGATIVE
RBC / HPF: NONE SEEN (ref 0–?)
Specific Gravity, Urine: >=1.03 — AB (ref 1.000–1.030)
Total Protein, Urine: NEGATIVE
Urine Glucose: NEGATIVE
Urobilinogen, UA: 0.2 (ref 0.0–1.0)
pH: 5.5 (ref 5.0–8.0)

## 2019-01-09 LAB — VITAMIN D 25 HYDROXY (VIT D DEFICIENCY, FRACTURES): VITD: 42.14 ng/mL (ref 30.00–100.00)

## 2019-01-09 LAB — LIPID PANEL
Cholesterol: 164 mg/dL (ref 0–200)
HDL: 49.1 mg/dL (ref >39.00)
LDL Cholesterol: 96 mg/dL (ref 0–99)
NonHDL: 114.59
Total CHOL/HDL Ratio: 3
Triglycerides: 92 mg/dL (ref 0.0–149.0)
VLDL: 18.4 mg/dL (ref 0.0–40.0)

## 2019-01-09 LAB — IBC PANEL
Iron: 141 ug/dL (ref 42–165)
Saturation Ratios: 36.2 % (ref 20.0–50.0)
Transferrin: 278 mg/dL (ref 212.0–360.0)

## 2019-01-09 LAB — TSH: TSH: 2.49 u[IU]/mL (ref 0.35–4.50)

## 2019-01-09 LAB — PSA: PSA: 0.71 ng/mL (ref 0.10–4.00)

## 2019-01-09 LAB — HEMOGLOBIN A1C: Hgb A1c MFr Bld: 5.9 % (ref 4.6–6.5)

## 2019-01-09 LAB — VITAMIN B12: Vitamin B-12: >1500 pg/mL — ABNORMAL HIGH (ref 211–911)

## 2019-01-09 NOTE — Patient Instructions (Signed)

## 2019-01-09 NOTE — Assessment & Plan Note (Signed)
stable overall by history and exam, recent data reviewed with pt, and pt to continue medical treatment as before,  to f/u any worsening symptoms or concerns  
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
## 2019-01-14 ENCOUNTER — Ambulatory Visit: Payer: Medicare Other | Admitting: Podiatry

## 2019-01-14 ENCOUNTER — Encounter: Payer: Self-pay | Admitting: Podiatry

## 2019-01-14 ENCOUNTER — Other Ambulatory Visit: Payer: Self-pay

## 2019-01-14 VITALS — BP 138/93 | HR 66 | Temp 98.1°F

## 2019-01-14 DIAGNOSIS — B351 Tinea unguium: Secondary | ICD-10-CM | POA: Diagnosis not present

## 2019-01-14 DIAGNOSIS — M79675 Pain in left toe(s): Secondary | ICD-10-CM

## 2019-01-14 DIAGNOSIS — M79674 Pain in right toe(s): Secondary | ICD-10-CM | POA: Diagnosis not present

## 2019-01-14 NOTE — Patient Instructions (Signed)

## 2019-01-16 ENCOUNTER — Encounter: Payer: Self-pay | Admitting: Podiatry
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
## 2019-02-03 ENCOUNTER — Other Ambulatory Visit: Payer: Self-pay | Admitting: Cardiology

## 2019-02-03 MED ORDER — LISINOPRIL 5 MG PO TABS: 5.0000 mg | ORAL_TABLET | Freq: Every day | ORAL | 3 refills | Status: DC

## 2019-02-03 NOTE — Telephone Encounter (Signed)
New message    *STAT* If patient is at the pharmacy, call can be transferred to refill team.   1. Which medications need to be refilled? (please list name of each medication and dose if known) lisinopril (ZESTRIL) 5 MG tablet  2. Which pharmacy/location (including street and city if local pharmacy) is medication to be sent to?[place], [place], [place]  3. Do they need a 30 day or 90 day supply? [place]

## 2019-02-03 NOTE — Telephone Encounter (Signed)
Let patient know that I refilled his Lisinopril.

## 2019-02-27 ENCOUNTER — Ambulatory Visit: Payer: Self-pay | Admitting: *Deleted

## 2019-02-27 ENCOUNTER — Other Ambulatory Visit: Payer: Self-pay

## 2019-02-27 DIAGNOSIS — R6889 Other general symptoms and signs: Secondary | ICD-10-CM | POA: Diagnosis not present

## 2019-02-27 DIAGNOSIS — Z20822 Contact with and (suspected) exposure to covid-19: Secondary | ICD-10-CM

## 2019-02-27 NOTE — Telephone Encounter (Signed)
Pt called with c/o dizziness and headache. He has some shortness of breath but not new.  He has no symptoms so nausea, blurred vision, chest pain. He said he did not have problems with his b/p and could not check it right now. He wanted to talk about getting tested for the virus. He stated that he thought he has some symptoms of covid-19 and wants to get tested.  He goes to the grocery store and wears his mask but stated the people shopping do not follow the signs showing which way to go. He denies fever, chest pain, shortness of breath, cough, abd pain, diarrhea or chest tightness. Advise him to go to [facility] testing site to get tested, wear a mask and stay in car until ready to be tested. Also advised him to check his b/p and call back. He voiced understanding. Routing to [facility] at [facility]  Reason for Disposition . Nursing judgment or information in reference  Answer Assessment - Initial Assessment Questions 1. REASON FOR CALL: "What is your main concern right now?"     Dizziness and headache and wants to get tested for covid-19 2. ONSET: "When did the symptoms start?"     today 3. SEVERITY: "How bad is the symptoms?"     bothersome 4. FEVER: "Do you have a fever?"     no 5. OTHER SYMPTOMS: "Do you have any other new symptoms?"     no 6. INTERVENTIONS AND RESPONSE: "What have you done so far to try to make this better? What medications have you used?"     nothing 7. PREGNANCY: "Is there any chance you are pregnant?"     n/a  Protocols used: NO GUIDELINE AVAILABLE-A-AH

## 2019-02-27 NOTE — Telephone Encounter (Signed)
Noted  

## 2019-02-28 LAB — NOVEL CORONAVIRUS, NAA: SARS-CoV-2, NAA: NOT DETECTED

## 2019-03-24 ENCOUNTER — Telehealth: Payer: Self-pay | Admitting: Neurology

## 2019-03-24 MED ORDER — DIVALPROEX SODIUM ER 500 MG PO TB24: ORAL_TABLET | ORAL | 0 refills | Status: DC

## 2019-03-24 NOTE — Telephone Encounter (Signed)
Called pt and informed him we called in temporary supply for him. He verbalized understanding.

## 2019-03-24 NOTE — Telephone Encounter (Signed)
Pt called in and stated he was out of town and left his divalproex (DEPAKOTE ER) 500 MG 24 hr tablet  At home and wants to know if 60tabs can be sent [place], [place]

## 2019-03-24 NOTE — Telephone Encounter (Signed)
E-scribed rx as requested. 

## 2019-04-02 ENCOUNTER — Other Ambulatory Visit: Payer: Self-pay | Admitting: Internal Medicine

## 2019-04-02 DIAGNOSIS — I2510 Atherosclerotic heart disease of native coronary artery without angina pectoris: Secondary | ICD-10-CM

## 2019-04-18 ENCOUNTER — Encounter: Payer: Self-pay | Admitting: Podiatry

## 2019-04-18 ENCOUNTER — Ambulatory Visit: Payer: Medicare Other | Admitting: Podiatry

## 2019-04-18 ENCOUNTER — Other Ambulatory Visit: Payer: Self-pay

## 2019-04-18 DIAGNOSIS — M79675 Pain in left toe(s): Secondary | ICD-10-CM

## 2019-04-18 DIAGNOSIS — M79674 Pain in right toe(s): Secondary | ICD-10-CM

## 2019-04-18 DIAGNOSIS — B351 Tinea unguium: Secondary | ICD-10-CM | POA: Diagnosis not present

## 2019-04-19 NOTE — Progress Notes (Signed)
Subjective:  Gary Atkins presents to clinic today with cc of  painful, thick, discolored, elongated toenails 1-5 b/l that become tender and cannot cut because of thickness.  Pain is aggravated when wearing enclosed shoe gear.  He voices no new pedal concerns on today's visit.  Current Outpatient Medications on File Prior to Visit  Medication Sig Dispense Refill  . aspirin 81 MG tablet Take 81 mg by mouth daily.      Marland Kitchen atorvastatin (LIPITOR) 20 MG tablet Take 1 tablet by mouth once daily 90 tablet 0  . carvedilol (COREG) 6.25 MG tablet Take 1 tablet (6.25 mg total) by mouth 2 (two) times daily. 180 tablet 2  . CRANBERRY PO Take 1 capsule by mouth daily.    . Cyanocobalamin (B-12 PO) Take 2 tablets by mouth daily.    . divalproex (DEPAKOTE ER) 500 MG 24 hr tablet Take 1 tablet (500 mg total) by mouth 2 (two) times daily. 180 tablet 3  . divalproex (DEPAKOTE ER) 500 MG 24 hr tablet Take 1 tablet by mouth twice daily 60 tablet 0  . lisinopril (ZESTRIL) 5 MG tablet Take 1 tablet (5 mg total) by mouth daily. 90 tablet 3  . Multiple Vitamins-Minerals (PRESERVISION AREDS PO) Take 1 tablet by mouth daily.    . Multiple Vitamins-Minerals (ZINC PO) Take 1 tablet by mouth daily.    . NON FORMULARY Antifungal toenail solution from [facility], faxed on 01/14/2019    . ondansetron (ZOFRAN ODT) 4 MG disintegrating tablet Take 1 tablet (4 mg total) by mouth every 8 (eight) hours as needed for nausea. 10 tablet 0  . oxyCODONE-acetaminophen (PERCOCET) 5-325 MG tablet Take 1 tablet by mouth every 4 (four) hours as needed. 20 tablet 0  . RA KRILL OIL 500 MG CAPS Take 1 capsule by mouth daily.     . tamsulosin (FLOMAX) 0.4 MG CAPS capsule Take 1 capsule (0.4 mg total) by mouth daily after supper. 10 capsule 0  . TURMERIC PO Take 1 tablet by mouth daily.     No current facility-administered medications on file prior to visit.      Allergies  Allergen Reactions  . Demerol [Meperidine] Other (See  Comments)    seizure     Objective:  Physical Examination:  Vascular Examination: Capillary refill time immediate x 10 digits.  Palpable DP/PT pulses b/l.  Digital hair absent b/l.  Edema b/l ankles. No erythema, no open wounds.  Skin temperature gradient WNL b/l.  Dermatological Examination: Skin thin, shiny and atrophic b/l.  No open wounds b/l.  No interdigital macerations noted b/l.  Elongated, thick, discolored brittle toenails with subungual debris and pain on dorsal palpation of nailbeds 1-5 b/l.  Musculoskeletal Examination: Muscle strength 5/5 to all muscle groups b/l.  No pain, crepitus or joint discomfort with active/passive ROM.  Neurological Examination: Sensation intact 5/5 b/l with 10 gram monofilament.  Vibratory sensation intact b/l.  Proprioceptive sensation intact b/l.  Assessment: Mycotic nail infection with pain 1-5 b/l  Plan: 1. Toenails 1-5 b/l were debrided in length and girth without iatrogenic laceration. 2.  Continue soft, supportive shoe gear daily. 3.  Report any pedal injuries to medical professional. 4.  Follow up 3 months. 5.  Patient/POA to call should there be a question/concern in there interim.

## 2019-04-30 ENCOUNTER — Other Ambulatory Visit: Payer: Self-pay | Admitting: Cardiology

## 2019-04-30 MED ORDER — LISINOPRIL 5 MG PO TABS: 5.0000 mg | ORAL_TABLET | Freq: Every day | ORAL | 3 refills | Status: DC

## 2019-04-30 NOTE — Telephone Encounter (Signed)
This is Dr. Christopher's pt 

## 2019-05-01 ENCOUNTER — Telehealth: Payer: Self-pay | Admitting: Cardiology

## 2019-05-01 ENCOUNTER — Other Ambulatory Visit: Payer: Self-pay

## 2019-05-01 MED ORDER — CARVEDILOL 6.25 MG PO TABS: 6.2500 mg | ORAL_TABLET | Freq: Two times a day (BID) | ORAL | 2 refills | Status: DC

## 2019-05-01 NOTE — Telephone Encounter (Signed)
Pt walked into the office this morning he states that he needs a refill for his carvedilol this has been sent

## 2019-05-01 NOTE — Telephone Encounter (Signed)
Pt calling wanting someone to give him a call back concerning his medication refills. Please address

## 2019-07-25 ENCOUNTER — Encounter: Payer: Self-pay | Admitting: Podiatry

## 2019-07-25 ENCOUNTER — Other Ambulatory Visit: Payer: Self-pay

## 2019-07-25 ENCOUNTER — Ambulatory Visit: Payer: Medicare Other | Admitting: Podiatry

## 2019-07-25 DIAGNOSIS — B351 Tinea unguium: Secondary | ICD-10-CM

## 2019-07-25 DIAGNOSIS — M79674 Pain in right toe(s): Secondary | ICD-10-CM

## 2019-07-25 DIAGNOSIS — M79675 Pain in left toe(s): Secondary | ICD-10-CM

## 2019-07-25 NOTE — Patient Instructions (Signed)

## 2019-07-29 DIAGNOSIS — X32XXXD Exposure to sunlight, subsequent encounter: Secondary | ICD-10-CM | POA: Diagnosis not present

## 2019-07-29 DIAGNOSIS — D0462 Carcinoma in situ of skin of left upper limb, including shoulder: Secondary | ICD-10-CM | POA: Diagnosis not present

## 2019-07-29 DIAGNOSIS — L570 Actinic keratosis: Secondary | ICD-10-CM | POA: Diagnosis not present

## 2019-07-29 DIAGNOSIS — L820 Inflamed seborrheic keratosis: Secondary | ICD-10-CM | POA: Diagnosis not present

## 2019-07-29 DIAGNOSIS — L821 Other seborrheic keratosis: Secondary | ICD-10-CM | POA: Diagnosis not present

## 2019-07-29 DIAGNOSIS — D225 Melanocytic nevi of trunk: Secondary | ICD-10-CM | POA: Diagnosis not present

## 2019-07-30 NOTE — Progress Notes (Signed)
Subjective: Gary Atkins presents today for follow up of painful mycotic nails b/l that are difficult to trim. Pain interferes with ambulation. Aggravating factors include wearing enclosed shoe gear. Pain is relieved with periodic professional debridement.   He voices no new complaints on today's visit.  Allergies  Allergen Reactions  . Demerol [Meperidine] Other (See Comments)    seizure     Objective: There were no vitals filed for this visit.  Vascular Examination:  Capillary refill time to digits immediate b/l, palpable DP pulses b/l, palpable PT pulses b/l, pedal hair sparse b/l, skin temperature gradient within normal limits b/l and trace edema noted b/l feet  Dermatological Examination: Pedal skin is thin shiny, atrophic bilaterally, no open wounds bilaterally, no interdigital macerations bilaterally and toenails 1-5 b/l elongated, dystrophic, thickened, crumbly with subungual debris  Musculoskeletal: Normal muscle strength 5/5 to all lower extremity muscle groups bilaterally, no gross bony deformities bilaterally and no pain crepitus or joint limitation noted with ROM b/l  Neurological: Protective sensation intact 5/5 intact bilaterally with 10g monofilament b/l and vibratory sensation intact b/l  Assessment: Pain due to onychomycosis of toenails of both feet  Plan: -Toenails 1-5 b/l were debrided in length and girth without iatrogenic bleeding. -Patient to continue soft, supportive shoe gear daily. -Patient to report any pedal injuries to medical professional immediately. -Patient/POA to call should there be question/concern in the interim.  Return in about 3 months (around 10/22/2019) for nail trim.

## 2019-08-28 ENCOUNTER — Other Ambulatory Visit: Payer: Self-pay

## 2019-08-28 ENCOUNTER — Encounter: Payer: Self-pay | Admitting: Cardiology

## 2019-08-28 ENCOUNTER — Ambulatory Visit: Payer: Medicare Other | Admitting: Cardiology

## 2019-08-28 VITALS — BP 104/73 | HR 66 | Ht 69.0 in | Wt 184.8 lb

## 2019-08-28 DIAGNOSIS — I493 Ventricular premature depolarization: Secondary | ICD-10-CM | POA: Diagnosis not present

## 2019-08-28 DIAGNOSIS — Z7189 Other specified counseling: Secondary | ICD-10-CM | POA: Diagnosis not present

## 2019-08-28 DIAGNOSIS — I2510 Atherosclerotic heart disease of native coronary artery without angina pectoris: Secondary | ICD-10-CM

## 2019-08-28 DIAGNOSIS — I428 Other cardiomyopathies: Secondary | ICD-10-CM | POA: Diagnosis not present

## 2019-08-28 DIAGNOSIS — I5042 Chronic combined systolic (congestive) and diastolic (congestive) heart failure: Secondary | ICD-10-CM

## 2019-08-28 NOTE — Patient Instructions (Addendum)
Medication Instructions:  Your Physician recommend you continue on your current medication as directed.    *If you need a refill on your cardiac medications before your next appointment, please call your pharmacy*   Lab Work: None   Testing/Procedures: None   Follow-Up: At [facility], you and your health needs are our priority.  As part of our continuing mission to provide you with exceptional heart care, we have created designated Provider Care Teams.  These Care Teams include your primary Cardiologist (physician) and Advanced Practice Providers (APPs -  Physician Assistants and Nurse Practitioners) who all work together to provide you with the care you need, when you need it.  We recommend signing up for the patient portal called "MyChart".  Sign up information is provided on this After Visit Summary.  MyChart is used to connect with patients for Virtual Visits (Telemedicine).  Patients are able to view lab/test results, encounter notes, upcoming appointments, etc.  Non-urgent messages can be sent to your provider as well.   To learn more about what you can do with MyChart, go to [email or web address].    Your next appointment:   6 month(s)  The format for your next appointment:   In Person  Provider:   Buford Dresser, MD   Other Instructions: Aim towards gradually increasing your walking. Aim for 150 minutes/week. If you have any issues, call and let me know.
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
## 2019-08-29 DIAGNOSIS — Z85828 Personal history of other malignant neoplasm of skin: Secondary | ICD-10-CM | POA: Diagnosis not present

## 2019-08-29 DIAGNOSIS — L820 Inflamed seborrheic keratosis: Secondary | ICD-10-CM | POA: Diagnosis not present

## 2019-08-29 DIAGNOSIS — X32XXXD Exposure to sunlight, subsequent encounter: Secondary | ICD-10-CM | POA: Diagnosis not present

## 2019-08-29 DIAGNOSIS — L570 Actinic keratosis: Secondary | ICD-10-CM | POA: Diagnosis not present

## 2019-08-29 DIAGNOSIS — Z08 Encounter for follow-up examination after completed treatment for malignant neoplasm: Secondary | ICD-10-CM | POA: Diagnosis not present

## 2019-09-11 ENCOUNTER — Other Ambulatory Visit: Payer: Self-pay | Admitting: Internal Medicine

## 2019-09-11 DIAGNOSIS — I2510 Atherosclerotic heart disease of native coronary artery without angina pectoris: Secondary | ICD-10-CM

## 2019-10-24 ENCOUNTER — Encounter: Payer: Self-pay | Admitting: Podiatry

## 2019-10-24 ENCOUNTER — Ambulatory Visit: Payer: Medicare Other | Admitting: Podiatry

## 2019-10-24 ENCOUNTER — Other Ambulatory Visit: Payer: Self-pay

## 2019-10-24 DIAGNOSIS — M79674 Pain in right toe(s): Secondary | ICD-10-CM

## 2019-10-24 DIAGNOSIS — M79675 Pain in left toe(s): Secondary | ICD-10-CM

## 2019-10-24 DIAGNOSIS — B351 Tinea unguium: Secondary | ICD-10-CM | POA: Diagnosis not present

## 2019-10-24 NOTE — Patient Instructions (Signed)

## 2019-10-30 NOTE — Progress Notes (Signed)
Subjective: Gary Atkins is a 79 y.o. male patient seen today painful mycotic nails b/l that are difficult to trim. Pain interferes with ambulation. Aggravating factors include wearing enclosed shoe gear. Pain is relieved with periodic professional debridement.    He relates he sustained a fall about one week ago. He injured his left lower leg and bruising pooled near his ankle. He relates no pain in ankle and injuries are healing fine.  Patient Active Problem List   Diagnosis Date Noted  . Nonobstructive atherosclerosis of coronary artery 02/02/2018  . BPPV (benign paroxysmal positional vertigo) 03/21/2017  . Bilateral hearing loss 10/11/2016  . Closed compression fracture of L1 lumbar vertebra 06/29/2016  . Cough 07/05/2015  . Left sided chest pain 07/05/2015  . Hyperglycemia 07/05/2015  . Memory loss 12/09/2014  . Contusion 08/17/2014  . Chronic combined systolic and diastolic heart failure ([place]) 07/07/2014  . 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 Depression 01/02/2013  . Chills (without fever) 01/02/2013  . Travel foreign 11/27/2011  . C. difficile colitis   . GERD (gastroesophageal reflux disease)   . Diverticulosis of colon   . Preventative health care 12/15/2010  . Back pain 12/15/2010  . SINUS BRADYCARDIA 08/17/2010  . Paroxysmal ventricular tachycardia ([place]) 12/03/2009  . PVC's (premature ventricular contractions) 10/01/2009  . CAD (coronary artery disease) 08/31/2009  . Convulsions/seizures ([place]) 08/11/2009  . NICM (nonischemic cardiomyopathy) ([place]) 07/30/2009  . DYSPNEA ON EXERTION 07/20/2009  . HLD (hyperlipidemia) 11/12/2007  . DIVERTICULOSIS, COLON 11/12/2007  . OSTEOPOROSIS 11/12/2007  . COLONIC POLYPS, HX OF 11/12/2007  . NEPHROLITHIASIS, HX OF 11/12/2007    Current Outpatient Medications on File Prior to Visit  Medication Sig Dispense Refill  . aspirin 81 MG tablet Take 81 mg by mouth daily.      Marland Kitchen atorvastatin (LIPITOR) 20 MG tablet Take 1 tablet (20 mg  total) by mouth daily. Annual appt due in july must see provider for future refills 90 tablet 0  . carvedilol (COREG) 6.25 MG tablet Take 1 tablet (6.25 mg total) by mouth 2 (two) times daily. 180 tablet 2  . CRANBERRY PO Take 1 capsule by mouth daily.    . Cyanocobalamin (B-12 PO) Take 2 tablets by mouth daily.    . divalproex (DEPAKOTE ER) 500 MG 24 hr tablet Take 1 tablet (500 mg total) by mouth 2 (two) times daily. 180 tablet 3  . divalproex (DEPAKOTE ER) 500 MG 24 hr tablet Take 1 tablet by mouth twice daily 60 tablet 0  . lisinopril (ZESTRIL) 5 MG tablet Take 1 tablet (5 mg total) by mouth daily. 90 tablet 3  . Multiple Vitamins-Minerals (PRESERVISION AREDS PO) Take 1 tablet by mouth daily.    . Multiple Vitamins-Minerals (ZINC PO) Take 1 tablet by mouth daily.    . NON FORMULARY Antifungal toenail solution from [facility], faxed on 01/14/2019    . ondansetron (ZOFRAN ODT) 4 MG disintegrating tablet Take 1 tablet (4 mg total) by mouth every 8 (eight) hours as needed for nausea. 10 tablet 0  . oxyCODONE-acetaminophen (PERCOCET) 5-325 MG tablet Take 1 tablet by mouth every 4 (four) hours as needed. 20 tablet 0  . RA KRILL OIL 500 MG CAPS Take 1 capsule by mouth daily.     . tamsulosin (FLOMAX) 0.4 MG CAPS capsule Take 1 capsule (0.4 mg total) by mouth daily after supper. 10 capsule 0  . TURMERIC PO Take 1 tablet by mouth daily.     No current facility-administered medications  on file prior to visit.    Allergies  Allergen Reactions  . Demerol [Meperidine] Other (See Comments)    seizure    Objective: Physical Exam  General: Gary Atkins is an 79 y.o. Caucasian male, well developed, well nourished, no acute distress. AAO x 3.   Neurovascular status unchanged b/l.  Dermatological:  Pedal skin is thin shiny, atrophic bilaterally. No open wounds bilaterally. No interdigital macerations bilaterally. Toenails 1-5 b/l elongated, dystrophic, thickened, crumbly with subungual  debris and tenderness to dorsal palpation.  Musculoskeletal:  Normal muscle strength 5/5 to all lower extremity muscle groups bilaterally. No gross bony deformities bilaterally. No pain crepitus or joint limitation noted with ROM b/l. Patient ambulates independent of any assistive aids.  Assessment and Plan:  1. Pain due to onychomycosis of toenails of both feet    -Examined patient. -No new findings. No new orders. -Toenails 1-5 b/l were debrided in length and girth with sterile nail nippers and dremel without iatrogenic bleeding.  -Patient to continue soft, supportive shoe gear daily. -Patient to report any pedal injuries to medical professional immediately. -Patient/POA to call should there be question/concern in the interim.  Return in about 3 months (around 01/24/2020) for nail trim.  Marzetta Board, DPM

## 2019-12-17 ENCOUNTER — Other Ambulatory Visit: Payer: Self-pay | Admitting: Internal Medicine

## 2019-12-17 DIAGNOSIS — I2510 Atherosclerotic heart disease of native coronary artery without angina pectoris: Secondary | ICD-10-CM

## 2019-12-17 NOTE — Telephone Encounter (Signed)
Please refill as per office routine med refill policy (all routine meds refilled for 3 mo or monthly per pt preference up to one year from last visit, then month to month grace period for 3 mo, then further med refills will have to be denied)  

## 2019-12-24 ENCOUNTER — Encounter: Payer: Self-pay | Admitting: Neurology

## 2019-12-24 ENCOUNTER — Ambulatory Visit: Payer: Medicare Other | Admitting: Neurology

## 2019-12-24 VITALS — BP 127/68 | HR 62 | Ht 69.0 in | Wt 183.0 lb

## 2019-12-24 DIAGNOSIS — M545 Low back pain, unspecified: Secondary | ICD-10-CM

## 2019-12-24 DIAGNOSIS — R413 Other amnesia: Secondary | ICD-10-CM

## 2019-12-24 DIAGNOSIS — H8111 Benign paroxysmal vertigo, right ear: Secondary | ICD-10-CM | POA: Diagnosis not present

## 2019-12-24 DIAGNOSIS — R569 Unspecified convulsions: Secondary | ICD-10-CM | POA: Diagnosis not present

## 2019-12-24 MED ORDER — DIVALPROEX SODIUM ER 500 MG PO TB24: 500.0000 mg | ORAL_TABLET | Freq: Two times a day (BID) | ORAL | 4 refills | Status: DC
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
## 2019-12-25 LAB — CBC WITH DIFFERENTIAL/PLATELET
Basophils Absolute: 0.1 10*3/uL (ref 0.0–0.2)
Basos: 1 %
EOS (ABSOLUTE): 0.4 10*3/uL (ref 0.0–0.4)
Eos: 6 %
Hematocrit: 46.5 % (ref 37.5–51.0)
Hemoglobin: 15.5 g/dL (ref 13.0–17.7)
Immature Grans (Abs): 0 10*3/uL (ref 0.0–0.1)
Immature Granulocytes: 1 %
Lymphocytes Absolute: 2.7 10*3/uL (ref 0.7–3.1)
Lymphs: 36 %
MCH: 31.6 pg (ref 26.6–33.0)
MCHC: 33.3 g/dL (ref 31.5–35.7)
MCV: 95 fL (ref 79–97)
Monocytes Absolute: 0.8 10*3/uL (ref 0.1–0.9)
Monocytes: 11 %
Neutrophils Absolute: 3.5 10*3/uL (ref 1.4–7.0)
Neutrophils: 45 %
Platelets: 123 10*3/uL — ABNORMAL LOW (ref 150–450)
RBC: 4.91 x10E6/uL (ref 4.14–5.80)
RDW: 13.1 % (ref 11.6–15.4)
WBC: 7.5 10*3/uL (ref 3.4–10.8)

## 2019-12-25 LAB — COMPREHENSIVE METABOLIC PANEL
ALT: 34 IU/L (ref 0–44)
AST: 30 IU/L (ref 0–40)
Albumin/Globulin Ratio: 1.6 (ref 1.2–2.2)
Albumin: 3.8 g/dL (ref 3.7–4.7)
Alkaline Phosphatase: 58 IU/L (ref 48–121)
BUN/Creatinine Ratio: 18 (ref 10–24)
BUN: 20 mg/dL (ref 8–27)
Bilirubin Total: 0.7 mg/dL (ref 0.0–1.2)
CO2: 25 mmol/L (ref 20–29)
Calcium: 9.6 mg/dL (ref 8.6–10.2)
Chloride: 102 mmol/L (ref 96–106)
Creatinine, Ser: 1.11 mg/dL (ref 0.76–1.27)
GFR calc Af Amer: 72 mL/min/{1.73_m2} (ref >59)
GFR calc non Af Amer: 62 mL/min/{1.73_m2} (ref >59)
Globulin, Total: 2.4 g/dL (ref 1.5–4.5)
Glucose: 89 mg/dL (ref 65–99)
Potassium: 5.1 mmol/L (ref 3.5–5.2)
Sodium: 138 mmol/L (ref 134–144)
Total Protein: 6.2 g/dL (ref 6.0–8.5)

## 2019-12-25 LAB — VALPROIC ACID LEVEL: Valproic Acid Lvl: 64 ug/mL (ref 50–100)

## 2020-01-14 ENCOUNTER — Ambulatory Visit: Payer: Medicare Other | Admitting: Internal Medicine

## 2020-01-16 ENCOUNTER — Encounter: Payer: Self-pay | Admitting: Internal Medicine

## 2020-01-16 ENCOUNTER — Ambulatory Visit (INDEPENDENT_AMBULATORY_CARE_PROVIDER_SITE_OTHER): Payer: Medicare Other | Admitting: Internal Medicine

## 2020-01-16 ENCOUNTER — Other Ambulatory Visit: Payer: Self-pay

## 2020-01-16 VITALS — BP 122/80 | HR 61 | Temp 98.6°F | Ht 69.0 in | Wt 182.0 lb

## 2020-01-16 DIAGNOSIS — R739 Hyperglycemia, unspecified: Secondary | ICD-10-CM | POA: Diagnosis not present

## 2020-01-16 DIAGNOSIS — I2510 Atherosclerotic heart disease of native coronary artery without angina pectoris: Secondary | ICD-10-CM

## 2020-01-16 DIAGNOSIS — Z0000 Encounter for general adult medical examination without abnormal findings: Secondary | ICD-10-CM | POA: Diagnosis not present
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
## 2020-01-18 ENCOUNTER — Encounter: Payer: Self-pay | Admitting: Internal Medicine

## 2020-01-18 MED ORDER — ATORVASTATIN CALCIUM 20 MG PO TABS: ORAL_TABLET | ORAL | 3 refills | Status: DC

## 2020-01-18 NOTE — Assessment & Plan Note (Signed)
stable overall by history and exam, recent data reviewed with pt, and pt to continue medical treatment as before,  to f/u any worsening symptoms or concerns  

## 2020-01-18 NOTE — Assessment & Plan Note (Signed)

## 2020-01-18 NOTE — Patient Instructions (Signed)
Please continue all other medications as before, and refills have been done if requested.  Please have the pharmacy call with any other refills you may need.  Please continue your efforts at being more active, low cholesterol diet, and weight control.  You are otherwise up to date with prevention measures today.  Please keep your appointments with your specialists as you may have planned   

## 2020-01-20 ENCOUNTER — Telehealth: Payer: Self-pay | Admitting: *Deleted

## 2020-01-20 NOTE — Telephone Encounter (Signed)
A detailed message was left,re: his follow up visit. °

## 2020-01-23 ENCOUNTER — Other Ambulatory Visit: Payer: Self-pay

## 2020-01-23 ENCOUNTER — Ambulatory Visit: Payer: Medicare Other | Admitting: Podiatry

## 2020-01-23 ENCOUNTER — Encounter: Payer: Self-pay | Admitting: Podiatry

## 2020-01-23 DIAGNOSIS — B351 Tinea unguium: Secondary | ICD-10-CM

## 2020-01-23 DIAGNOSIS — M79674 Pain in right toe(s): Secondary | ICD-10-CM

## 2020-01-23 DIAGNOSIS — M79675 Pain in left toe(s): Secondary | ICD-10-CM
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
## 2020-02-09 ENCOUNTER — Other Ambulatory Visit: Payer: Self-pay | Admitting: Cardiology

## 2020-02-19 ENCOUNTER — Other Ambulatory Visit: Payer: Self-pay | Admitting: Cardiology

## 2020-02-25 ENCOUNTER — Ambulatory Visit: Payer: Medicare Other | Admitting: Cardiology

## 2020-02-25 ENCOUNTER — Encounter: Payer: Self-pay | Admitting: Cardiology

## 2020-02-25 ENCOUNTER — Other Ambulatory Visit: Payer: Self-pay

## 2020-02-25 VITALS — BP 118/80 | HR 84 | Ht 69.0 in | Wt 181.0 lb

## 2020-02-25 DIAGNOSIS — Z7189 Other specified counseling: Secondary | ICD-10-CM | POA: Diagnosis not present

## 2020-02-25 DIAGNOSIS — I493 Ventricular premature depolarization: Secondary | ICD-10-CM | POA: Diagnosis not present

## 2020-02-25 DIAGNOSIS — I2510 Atherosclerotic heart disease of native coronary artery without angina pectoris: Secondary | ICD-10-CM | POA: Diagnosis not present

## 2020-02-25 DIAGNOSIS — I428 Other cardiomyopathies: Secondary | ICD-10-CM

## 2020-02-25 DIAGNOSIS — I5042 Chronic combined systolic (congestive) and diastolic (congestive) heart failure: Secondary | ICD-10-CM | POA: Diagnosis not present
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
## 2020-02-25 NOTE — Patient Instructions (Signed)

## 2020-04-12 DIAGNOSIS — H401111 Primary open-angle glaucoma, right eye, mild stage: Secondary | ICD-10-CM | POA: Diagnosis not present

## 2020-04-12 DIAGNOSIS — H04123 Dry eye syndrome of bilateral lacrimal glands: Secondary | ICD-10-CM | POA: Diagnosis not present

## 2020-04-12 DIAGNOSIS — Z961 Presence of intraocular lens: Secondary | ICD-10-CM | POA: Diagnosis not present

## 2020-04-12 DIAGNOSIS — H2511 Age-related nuclear cataract, right eye: Secondary | ICD-10-CM | POA: Diagnosis not present

## 2020-04-12 DIAGNOSIS — H401122 Primary open-angle glaucoma, left eye, moderate stage: Secondary | ICD-10-CM | POA: Diagnosis not present

## 2020-04-30 ENCOUNTER — Ambulatory Visit: Payer: Medicare Other | Admitting: Podiatry

## 2020-05-07 ENCOUNTER — Ambulatory Visit: Payer: Medicare Other | Admitting: Surgical

## 2020-05-07 DIAGNOSIS — Z8669 Personal history of other diseases of the nervous system and sense organs: Secondary | ICD-10-CM

## 2020-05-08 ENCOUNTER — Encounter: Payer: Self-pay | Admitting: Surgical
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
## 2020-05-26 DIAGNOSIS — L570 Actinic keratosis: Secondary | ICD-10-CM | POA: Diagnosis not present

## 2020-05-26 DIAGNOSIS — B078 Other viral warts: Secondary | ICD-10-CM | POA: Diagnosis not present

## 2020-05-26 DIAGNOSIS — X32XXXD Exposure to sunlight, subsequent encounter: Secondary | ICD-10-CM | POA: Diagnosis not present

## 2020-05-26 DIAGNOSIS — L820 Inflamed seborrheic keratosis: Secondary | ICD-10-CM | POA: Diagnosis not present

## 2020-06-23 ENCOUNTER — Ambulatory Visit (INDEPENDENT_AMBULATORY_CARE_PROVIDER_SITE_OTHER): Payer: Medicare Other | Admitting: Podiatry

## 2020-06-23 ENCOUNTER — Other Ambulatory Visit: Payer: Self-pay

## 2020-06-23 ENCOUNTER — Encounter: Payer: Self-pay | Admitting: Podiatry

## 2020-06-23 DIAGNOSIS — M79674 Pain in right toe(s): Secondary | ICD-10-CM | POA: Diagnosis not present

## 2020-06-23 DIAGNOSIS — B351 Tinea unguium: Secondary | ICD-10-CM

## 2020-06-23 DIAGNOSIS — M79675 Pain in left toe(s): Secondary | ICD-10-CM

## 2020-06-23 NOTE — Progress Notes (Signed)
This patient returns to the office for evaluation and treatment of long thick painful nails .  This patient is unable to trim his own nails since the patient cannot reach his feet.  Patient says the nails are painful walking and wearing his shoes.  He returns for preventive foot care services.  General Appearance  Alert, conversant and in no acute stress.  Vascular  Dorsalis pedis and posterior tibial  pulses are palpable  bilaterally.  Capillary return is within normal limits  bilaterally. Temperature is within normal limits  bilaterally.  Neurologic  Senn-Weinstein monofilament wire test within normal limits  bilaterally. Muscle power within normal limits bilaterally.  Nails Thick disfigured discolored nails with subungual debris  from hallux to fifth toes bilaterally. No evidence of bacterial infection or drainage bilaterally.  Orthopedic  No limitations of motion  feet .  No crepitus or effusions noted.  No bony pathology or digital deformities noted.  Skin  normotropic skin with no porokeratosis noted bilaterally.  No signs of infections or ulcers noted.     Onychomycosis  Pain in toes right foot  Pain in toes left foot  Debridement  of nails  1-5  B/L with a nail nipper.  Nails were then filed using a dremel tool with no incidents.    RTC  3 months    Nishan Ovens DPM  

## 2020-06-25 ENCOUNTER — Telehealth: Payer: Self-pay | Admitting: Podiatry

## 2020-06-25 ENCOUNTER — Other Ambulatory Visit: Payer: Self-pay | Admitting: *Deleted

## 2020-06-25 ENCOUNTER — Telehealth: Payer: Self-pay | Admitting: Internal Medicine

## 2020-06-25 DIAGNOSIS — I2510 Atherosclerotic heart disease of native coronary artery without angina pectoris: Secondary | ICD-10-CM

## 2020-06-25 NOTE — Telephone Encounter (Signed)
Please refill as per office routine med refill policy (all routine meds refilled for 3 mo or monthly per pt preference up to one year from last visit, then month to month grace period for 3 mo, then further med refills will have to be denied)  

## 2020-06-25 NOTE — Telephone Encounter (Signed)
Patient is requesting a refill of the medication he puts on his toe from [place].

## 2020-06-28 NOTE — Telephone Encounter (Signed)
Patient states he was told that he needed another appointment before getting next refill  He says Dr. Jenny Reichmann had told him he didn't need any appointments unless something came up, his next appt is in August so patient wants to confirm that he doesn't need another appointment when time for refill  Please follow-up with the patient [phone]

## 2020-06-29 ENCOUNTER — Telehealth: Payer: Self-pay | Admitting: Podiatry

## 2020-06-29 NOTE — Telephone Encounter (Signed)
Called [facility] and gave OTP prescription for refill on compound antifungal  , spoke w/ Mickel Baas

## 2020-06-29 NOTE — Telephone Encounter (Signed)
Patient notified VIA phone and he did pick up the rx at the pharmacy.  He was just asking why it had now refills.  Advised that when needed again to ask for a refill. Dm/cma

## 2020-06-29 NOTE — Telephone Encounter (Signed)
complete

## 2020-06-29 NOTE — Telephone Encounter (Signed)
Patient is requesting refill on prescription from [place] stated he is already out of meds, patient called in and requested refill on 06/26/2019 and it was never routed to you, Please Advise

## 2020-07-30 DIAGNOSIS — X32XXXD Exposure to sunlight, subsequent encounter: Secondary | ICD-10-CM | POA: Diagnosis not present

## 2020-07-30 DIAGNOSIS — L570 Actinic keratosis: Secondary | ICD-10-CM | POA: Diagnosis not present

## 2020-09-02 ENCOUNTER — Encounter: Payer: Self-pay | Admitting: Internal Medicine

## 2020-09-21 ENCOUNTER — Ambulatory Visit: Payer: Medicare Other | Admitting: Podiatry

## 2020-09-27 ENCOUNTER — Other Ambulatory Visit: Payer: Self-pay | Admitting: Internal Medicine

## 2020-09-27 DIAGNOSIS — I2510 Atherosclerotic heart disease of native coronary artery without angina pectoris: Secondary | ICD-10-CM

## 2020-09-30 ENCOUNTER — Other Ambulatory Visit: Payer: Self-pay | Admitting: Internal Medicine

## 2020-09-30 DIAGNOSIS — I2510 Atherosclerotic heart disease of native coronary artery without angina pectoris: Secondary | ICD-10-CM

## 2020-09-30 NOTE — Telephone Encounter (Signed)
Please refill as per office routine med refill policy (all routine meds refilled for 3 mo or monthly per pt preference up to one year from last visit, then month to month grace period for 3 mo, then further med refills will have to be denied)  

## 2020-10-22 ENCOUNTER — Encounter: Payer: Self-pay | Admitting: Podiatry

## 2020-10-22 ENCOUNTER — Other Ambulatory Visit: Payer: Self-pay

## 2020-10-22 ENCOUNTER — Ambulatory Visit: Payer: Medicare Other | Admitting: Podiatry

## 2020-10-22 DIAGNOSIS — M79674 Pain in right toe(s): Secondary | ICD-10-CM

## 2020-10-22 DIAGNOSIS — B351 Tinea unguium: Secondary | ICD-10-CM | POA: Diagnosis not present

## 2020-10-22 DIAGNOSIS — M79675 Pain in left toe(s): Secondary | ICD-10-CM | POA: Diagnosis not present

## 2020-10-22 NOTE — Progress Notes (Signed)
This patient returns to the office for evaluation and treatment of long thick painful nails .  This patient is unable to trim his own nails since the patient cannot reach his feet.  Patient says the nails are painful walking and wearing his shoes.  He returns for preventive foot care services.  General Appearance  Alert, conversant and in no acute stress.  Vascular  Dorsalis pedis and posterior tibial  pulses are palpable  bilaterally.  Capillary return is within normal limits  bilaterally. Temperature is within normal limits  bilaterally.  Neurologic  Senn-Weinstein monofilament wire test within normal limits  bilaterally. Muscle power within normal limits bilaterally.  Nails Thick disfigured discolored nails with subungual debris  from hallux to fifth toes bilaterally. No evidence of bacterial infection or drainage bilaterally.  Orthopedic  No limitations of motion  feet .  No crepitus or effusions noted.  No bony pathology or digital deformities noted.  Skin  normotropic skin with no porokeratosis noted bilaterally.  No signs of infections or ulcers noted.     Onychomycosis  Pain in toes right foot  Pain in toes left foot  Debridement  of nails  1-5  B/L with a nail nipper.  Nails were then filed using a dremel tool with no incidents.    RTC  3 months    Baran Kuhrt DPM  

## 2020-11-13 ENCOUNTER — Other Ambulatory Visit: Payer: Self-pay | Admitting: Cardiology

## 2020-11-17 ENCOUNTER — Other Ambulatory Visit: Payer: Self-pay | Admitting: Cardiology

## 2020-11-17 MED ORDER — CARVEDILOL 6.25 MG PO TABS: 6.2500 mg | ORAL_TABLET | Freq: Two times a day (BID) | ORAL | 1 refills | Status: DC

## 2020-11-17 NOTE — Telephone Encounter (Signed)
Carvedilol refilled  Appointment with MD 02/28/2021

## 2020-11-19 DIAGNOSIS — L570 Actinic keratosis: Secondary | ICD-10-CM | POA: Diagnosis not present

## 2020-11-19 DIAGNOSIS — X32XXXD Exposure to sunlight, subsequent encounter: Secondary | ICD-10-CM | POA: Diagnosis not present

## 2020-11-19 DIAGNOSIS — B078 Other viral warts: Secondary | ICD-10-CM | POA: Diagnosis not present

## 2020-11-19 DIAGNOSIS — L820 Inflamed seborrheic keratosis: Secondary | ICD-10-CM | POA: Diagnosis not present

## 2020-11-26 ENCOUNTER — Other Ambulatory Visit: Payer: Self-pay

## 2020-11-26 ENCOUNTER — Encounter: Payer: Self-pay | Admitting: Internal Medicine

## 2020-11-26 ENCOUNTER — Ambulatory Visit (INDEPENDENT_AMBULATORY_CARE_PROVIDER_SITE_OTHER): Payer: Medicare Other | Admitting: Internal Medicine

## 2020-11-26 ENCOUNTER — Telehealth: Payer: Self-pay

## 2020-11-26 ENCOUNTER — Ambulatory Visit (HOSPITAL_BASED_OUTPATIENT_CLINIC_OR_DEPARTMENT_OTHER)
Admission: RE | Admit: 2020-11-26 | Discharge: 2020-11-26 | Disposition: A | Discharge status: Home / Self Care | Payer: Medicare Other | Source: Ambulatory Visit | Attending: Internal Medicine | Admitting: Internal Medicine

## 2020-11-26 VITALS — BP 142/94 | HR 63 | Temp 98.4°F | Resp 18 | Ht 69.0 in | Wt 182.2 lb

## 2020-11-26 DIAGNOSIS — S6721XA Crushing injury of right hand, initial encounter: Secondary | ICD-10-CM

## 2020-11-26 DIAGNOSIS — S60221A Contusion of right hand, initial encounter: Secondary | ICD-10-CM | POA: Diagnosis not present

## 2020-11-26 DIAGNOSIS — M7989 Other specified soft tissue disorders: Secondary | ICD-10-CM | POA: Diagnosis not present

## 2020-11-26 NOTE — Telephone Encounter (Signed)
Spoke w/ Pt -informed of R hand x-ray results. Pt verbalized understanding.

## 2020-11-26 NOTE — Patient Instructions (Signed)
Please go to the first floor and get the x-ray  Okay to continue taking Tylenol for pain  Try to prop up your hand the best you can  If you have intense pain or increased swelling: Please let us know  If the x-rays show a fracture, we will send you to the orthopedic doctor
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
## 2020-11-30 ENCOUNTER — Other Ambulatory Visit: Payer: Self-pay | Admitting: Cardiology

## 2020-12-28 ENCOUNTER — Ambulatory Visit: Payer: Medicare Other | Admitting: Family Medicine

## 2021-01-03 ENCOUNTER — Other Ambulatory Visit: Payer: Self-pay | Admitting: Internal Medicine

## 2021-01-03 DIAGNOSIS — I2510 Atherosclerotic heart disease of native coronary artery without angina pectoris: Secondary | ICD-10-CM

## 2021-01-03 NOTE — Telephone Encounter (Signed)
Please refill as per office routine med refill policy (all routine meds refilled for 3 mo or monthly per pt preference up to one year from last visit, then month to month grace period for 3 mo, then further med refills will have to be denied)  

## 2021-01-18 ENCOUNTER — Other Ambulatory Visit: Payer: Self-pay

## 2021-01-18 ENCOUNTER — Encounter: Payer: Self-pay | Admitting: Internal Medicine

## 2021-01-18 ENCOUNTER — Ambulatory Visit (INDEPENDENT_AMBULATORY_CARE_PROVIDER_SITE_OTHER): Payer: Medicare Other | Admitting: Internal Medicine

## 2021-01-18 VITALS — BP 124/82 | HR 65 | Temp 98.2°F | Ht 69.0 in | Wt 168.0 lb

## 2021-01-18 DIAGNOSIS — R739 Hyperglycemia, unspecified: Secondary | ICD-10-CM

## 2021-01-18 DIAGNOSIS — E785 Hyperlipidemia, unspecified: Secondary | ICD-10-CM

## 2021-01-18 DIAGNOSIS — I5042 Chronic combined systolic (congestive) and diastolic (congestive) heart failure: Secondary | ICD-10-CM

## 2021-01-18 DIAGNOSIS — Z0001 Encounter for general adult medical examination with abnormal findings: Secondary | ICD-10-CM

## 2021-01-18 DIAGNOSIS — E559 Vitamin D deficiency, unspecified: Secondary | ICD-10-CM

## 2021-01-18 DIAGNOSIS — R569 Unspecified convulsions: Secondary | ICD-10-CM

## 2021-01-18 DIAGNOSIS — E538 Deficiency of other specified B group vitamins: Secondary | ICD-10-CM

## 2021-01-18 DIAGNOSIS — F32A Depression, unspecified: Secondary | ICD-10-CM

## 2021-01-18 LAB — CBC WITH DIFFERENTIAL/PLATELET
Basophils Absolute: 0.1 10*3/uL (ref 0.0–0.1)
Basophils Relative: 1.1 % (ref 0.0–3.0)
Eosinophils Absolute: 0.3 10*3/uL (ref 0.0–0.7)
Eosinophils Relative: 5.1 % — ABNORMAL HIGH (ref 0.0–5.0)
HCT: 44.8 % (ref 39.0–52.0)
Hemoglobin: 15.2 g/dL (ref 13.0–17.0)
Lymphocytes Relative: 38.9 % (ref 12.0–46.0)
Lymphs Abs: 2.5 10*3/uL (ref 0.7–4.0)
MCHC: 34 g/dL (ref 30.0–36.0)
MCV: 94.3 fl (ref 78.0–100.0)
Monocytes Absolute: 0.8 10*3/uL (ref 0.1–1.0)
Monocytes Relative: 11.8 % (ref 3.0–12.0)
Neutro Abs: 2.8 10*3/uL (ref 1.4–7.7)
Neutrophils Relative %: 43.1 % (ref 43.0–77.0)
Platelets: 121 10*3/uL — ABNORMAL LOW (ref 150.0–400.0)
RBC: 4.75 Mil/uL (ref 4.22–5.81)
RDW: 13.8 % (ref 11.5–15.5)
WBC: 6.4 10*3/uL (ref 4.0–10.5)

## 2021-01-18 LAB — URINALYSIS, ROUTINE W REFLEX MICROSCOPIC
Bilirubin Urine: NEGATIVE
Hgb urine dipstick: NEGATIVE
Leukocytes,Ua: NEGATIVE
Nitrite: NEGATIVE
RBC / HPF: NONE SEEN (ref 0–?)
Specific Gravity, Urine: >=1.03 — AB (ref 1.000–1.030)
Total Protein, Urine: NEGATIVE
Urine Glucose: NEGATIVE
Urobilinogen, UA: 1 (ref 0.0–1.0)
WBC, UA: NONE SEEN (ref 0–?)
pH: 5.5 (ref 5.0–8.0)

## 2021-01-18 LAB — LIPID PANEL
Cholesterol: 129 mg/dL (ref 0–200)
HDL: 45 mg/dL (ref >39.00)
LDL Cholesterol: 70 mg/dL (ref 0–99)
NonHDL: 83.91
Total CHOL/HDL Ratio: 3
Triglycerides: 72 mg/dL (ref 0.0–149.0)
VLDL: 14.4 mg/dL (ref 0.0–40.0)

## 2021-01-18 LAB — HEPATIC FUNCTION PANEL
ALT: 33 U/L (ref 0–53)
AST: 24 U/L (ref 0–37)
Albumin: 4 g/dL (ref 3.5–5.2)
Alkaline Phosphatase: 42 U/L (ref 39–117)
Bilirubin, Direct: 0.2 mg/dL (ref 0.0–0.3)
Total Bilirubin: 0.9 mg/dL (ref 0.2–1.2)
Total Protein: 7.1 g/dL (ref 6.0–8.3)

## 2021-01-18 LAB — VITAMIN D 25 HYDROXY (VIT D DEFICIENCY, FRACTURES): VITD: 48.36 ng/mL (ref 30.00–100.00)

## 2021-01-18 LAB — BASIC METABOLIC PANEL
BUN: 19 mg/dL (ref 6–23)
CO2: 28 mEq/L (ref 19–32)
Calcium: 9.7 mg/dL (ref 8.4–10.5)
Chloride: 104 mEq/L (ref 96–112)
Creatinine, Ser: 1.01 mg/dL (ref 0.40–1.50)
GFR: 69.59 mL/min (ref >60.00)
Glucose, Bld: 103 mg/dL — ABNORMAL HIGH (ref 70–99)
Potassium: 4.9 mEq/L (ref 3.5–5.1)
Sodium: 141 mEq/L (ref 135–145)

## 2021-01-18 LAB — VITAMIN B12: Vitamin B-12: >1550 pg/mL — ABNORMAL HIGH (ref 211–911)

## 2021-01-18 LAB — HEMOGLOBIN A1C: Hgb A1c MFr Bld: 6 % (ref 4.6–6.5)

## 2021-01-18 LAB — TSH: TSH: 2.92 u[IU]/mL (ref 0.35–5.50)
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
## 2021-01-18 NOTE — Assessment & Plan Note (Addendum)
Lab Results  Component Value Date   LDLCALC 96 01/09/2019   Uncontrolled, goal ldl < 70 pt to continue current statin lipitor 20 but consider increase after f/u lab today

## 2021-01-18 NOTE — Patient Instructions (Addendum)
Please check with your insurance about the shingles shot coverage  Please continue all other medications as before, and refills have been done if requested.  Please have the pharmacy call with any other refills you may need.  Please continue your efforts at being more active, low cholesterol diet, and weight control.  You are otherwise up to date with prevention measures today.  Please keep your appointments with your specialists as you may have planned  Please go to the LAB at the blood drawing area for the tests to be done  You will be contacted by phone if any changes need to be made immediately.  Otherwise, you will receive a letter about your results with an explanation, but please check with MyChart first.  Please remember to sign up for MyChart if you have not done so, as this will be important to you in the future with finding out test results, communicating by private email, and scheduling acute appointments online when needed.  Please make an Appointment to return for your 1 year visit, or sooner if needed

## 2021-01-19 LAB — VALPROIC ACID LEVEL: Valproic Acid Lvl: 62.3 mg/L (ref 50.0–100.0)

## 2021-01-20 ENCOUNTER — Encounter: Payer: Self-pay | Admitting: Internal Medicine

## 2021-01-20 NOTE — Assessment & Plan Note (Signed)
Stable, also for depakote level

## 2021-01-20 NOTE — Assessment & Plan Note (Signed)
Lab Results  Component Value Date   HGBA1C 6.0 01/18/2021   Stable, pt to continue current medical treatment  - diet, wt control

## 2021-01-20 NOTE — Assessment & Plan Note (Signed)
Stable overall, cont current med tx - coreg, lisinopril

## 2021-01-20 NOTE — Assessment & Plan Note (Signed)
Age and sex appropriate education and counseling updated with regular exercise and diet Referrals for preventative services - none needed Immunizations addressed - declies covid booster, flu shot, shingrix Smoking counseling  - none needed Evidence for depression or other mood disorder - stable, declines current need for change in tx Most recent labs reviewed. I have personally reviewed and have noted: 1) the patient's medical and social history 2) The patient's current medications and supplements 3) The patient's height, weight, and BMI have been recorded in the chart

## 2021-01-20 NOTE — Assessment & Plan Note (Signed)
Stable overall, delcines need for change in tx

## 2021-01-24 ENCOUNTER — Ambulatory Visit: Payer: Medicare Other | Admitting: Podiatry

## 2021-01-24 ENCOUNTER — Encounter: Payer: Self-pay | Admitting: Podiatry

## 2021-01-24 ENCOUNTER — Other Ambulatory Visit: Payer: Self-pay

## 2021-01-24 ENCOUNTER — Other Ambulatory Visit: Payer: Self-pay | Admitting: Neurology

## 2021-01-24 DIAGNOSIS — B351 Tinea unguium: Secondary | ICD-10-CM | POA: Diagnosis not present

## 2021-01-24 DIAGNOSIS — M79675 Pain in left toe(s): Secondary | ICD-10-CM

## 2021-01-24 DIAGNOSIS — M79674 Pain in right toe(s): Secondary | ICD-10-CM

## 2021-01-24 NOTE — Progress Notes (Signed)
This patient returns to the office for evaluation and treatment of long thick painful nails .  This patient is unable to trim his own nails since the patient cannot reach his feet.  Patient says the nails are painful walking and wearing his shoes.  He returns for preventive foot care services.  General Appearance  Alert, conversant and in no acute stress.  Vascular  Dorsalis pedis and posterior tibial  pulses are palpable  bilaterally.  Capillary return is within normal limits  bilaterally. Temperature is within normal limits  bilaterally.  Neurologic  Senn-Weinstein monofilament wire test within normal limits  bilaterally. Muscle power within normal limits bilaterally.  Nails Thick disfigured discolored nails with subungual debris  from hallux to fifth toes bilaterally. No evidence of bacterial infection or drainage bilaterally.  Orthopedic  No limitations of motion  feet .  No crepitus or effusions noted.  No bony pathology or digital deformities noted.  Skin  normotropic skin with no porokeratosis noted bilaterally.  No signs of infections or ulcers noted.     Onychomycosis  Pain in toes right foot  Pain in toes left foot  Debridement  of nails  1-5  B/L with a nail nipper.  Nails were then filed using a dremel tool with no incidents.    RTC  10 weeks    Vanassa Penniman DPM  

## 2021-01-31 ENCOUNTER — Other Ambulatory Visit: Payer: Self-pay | Admitting: Internal Medicine

## 2021-01-31 ENCOUNTER — Telehealth: Payer: Self-pay

## 2021-01-31 DIAGNOSIS — I2510 Atherosclerotic heart disease of native coronary artery without angina pectoris: Secondary | ICD-10-CM

## 2021-02-01 NOTE — Telephone Encounter (Signed)
Appt made

## 2021-02-18 ENCOUNTER — Other Ambulatory Visit: Payer: Self-pay

## 2021-02-18 ENCOUNTER — Ambulatory Visit (INDEPENDENT_AMBULATORY_CARE_PROVIDER_SITE_OTHER): Payer: Medicare Other

## 2021-02-18 ENCOUNTER — Telehealth: Payer: Self-pay

## 2021-02-18 DIAGNOSIS — Z23 Encounter for immunization: Secondary | ICD-10-CM | POA: Diagnosis not present

## 2021-02-18 NOTE — Telephone Encounter (Signed)
Contacted patient to discussion the scheduled shingle shot. The patient may pay full price for the in office vaccination as insurance will not cover any percentage.

## 2021-02-18 NOTE — Progress Notes (Addendum)
**  Pt was also given HIGH DOSE flu vacc.

## 2021-02-28 ENCOUNTER — Ambulatory Visit (HOSPITAL_BASED_OUTPATIENT_CLINIC_OR_DEPARTMENT_OTHER): Payer: Medicare Other | Admitting: Cardiology

## 2021-02-28 ENCOUNTER — Other Ambulatory Visit: Payer: Self-pay

## 2021-02-28 ENCOUNTER — Encounter (HOSPITAL_BASED_OUTPATIENT_CLINIC_OR_DEPARTMENT_OTHER): Payer: Self-pay | Admitting: Cardiology

## 2021-02-28 VITALS — BP 114/80 | HR 56 | Ht 69.0 in | Wt 181.6 lb

## 2021-02-28 DIAGNOSIS — I493 Ventricular premature depolarization: Secondary | ICD-10-CM | POA: Diagnosis not present

## 2021-02-28 DIAGNOSIS — I428 Other cardiomyopathies: Secondary | ICD-10-CM | POA: Diagnosis not present

## 2021-02-28 DIAGNOSIS — I2510 Atherosclerotic heart disease of native coronary artery without angina pectoris: Secondary | ICD-10-CM

## 2021-02-28 DIAGNOSIS — Z7189 Other specified counseling: Secondary | ICD-10-CM | POA: Diagnosis not present

## 2021-02-28 DIAGNOSIS — I5042 Chronic combined systolic (congestive) and diastolic (congestive) heart failure: Secondary | ICD-10-CM

## 2021-02-28 NOTE — Patient Instructions (Addendum)
Medication Instructions:  Your physician recommends that you continue on your current medications as directed. Please refer to the Current Medication list given to you today.   *If you need a refill on your cardiac medications before your next appointment, please call your pharmacy*  Lab Work: NONE   Testing/Procedures: NONE  Follow-Up: At [facility], you and your health needs are our priority.  As part of our continuing mission to provide you with exceptional heart care, we have created designated Provider Care Teams.  These Care Teams include your primary Cardiologist (physician) and Advanced Practice Providers (APPs -  Physician Assistants and Nurse Practitioners) who all work together to provide you with the care you need, when you need it.  We recommend signing up for the patient portal called "MyChart".  Sign up information is provided on this After Visit Summary.  MyChart is used to connect with patients for Virtual Visits (Telemedicine).  Patients are able to view lab/test results, encounter notes, upcoming appointments, etc.  Non-urgent messages can be sent to your provider as well.   To learn more about what you can do with MyChart, go to [email or web address].    Your next appointment:   12 month(s)  The format for your next appointment:   In Person  Provider:   Buford Dresser, MD or Laurann Montana, NP
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
## 2021-03-06 ENCOUNTER — Other Ambulatory Visit: Payer: Self-pay | Admitting: Neurology

## 2021-03-21 ENCOUNTER — Telehealth: Payer: Self-pay | Admitting: Podiatry

## 2021-03-21 ENCOUNTER — Telehealth: Payer: Self-pay | Admitting: *Deleted

## 2021-03-21 NOTE — Telephone Encounter (Signed)
Patient called and stated that a refill request was sent from [facility](faxed) and it needed to be filled out since it was denied a refill over a week ago. Please Advise

## 2021-03-21 NOTE — Telephone Encounter (Signed)
Patient has been scheduled too soon for his upcoming,per Dr Ray Church have to pay for visit. Please reschedule.
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
## 2021-03-21 NOTE — Patient Instructions (Signed)
Below is our plan:  We will continue divalproex 500mg  twice daily.   Please make sure you are staying well hydrated. I recommend 50-60 ounces daily. Well balanced diet and regular exercise encouraged. Consistent sleep schedule with 6-8 hours recommended.   Please continue follow up with care team as directed.   Follow up with me in 1 year   You may receive a survey regarding today's visit. I encourage you to leave honest feed back as I do use this information to improve patient care. Thank you for seeing me today!

## 2021-03-21 NOTE — Telephone Encounter (Signed)
Please advise 

## 2021-03-21 NOTE — Telephone Encounter (Signed)
Another signed prescription was faxed to [facility] today, called them and they are working to fill, but there may have been an issue w/ patient's card, will contact patient, informed the patient of this,verbalized understanding.

## 2021-03-22 ENCOUNTER — Ambulatory Visit: Payer: Medicare Other | Admitting: Family Medicine

## 2021-03-22 ENCOUNTER — Encounter: Payer: Self-pay | Admitting: Family Medicine

## 2021-03-22 VITALS — BP 110/77 | HR 86 | Ht 69.0 in | Wt 180.5 lb

## 2021-03-22 DIAGNOSIS — R569 Unspecified convulsions: Secondary | ICD-10-CM | POA: Diagnosis not present

## 2021-03-22 DIAGNOSIS — R413 Other amnesia: Secondary | ICD-10-CM

## 2021-03-22 DIAGNOSIS — H8111 Benign paroxysmal vertigo, right ear: Secondary | ICD-10-CM

## 2021-03-22 DIAGNOSIS — M545 Low back pain, unspecified: Secondary | ICD-10-CM

## 2021-03-22 MED ORDER — DIVALPROEX SODIUM ER 500 MG PO TB24: 500.0000 mg | ORAL_TABLET | Freq: Two times a day (BID) | ORAL | 3 refills | Status: DC

## 2021-04-04 ENCOUNTER — Telehealth: Payer: Self-pay | Admitting: Family Medicine

## 2021-04-04 NOTE — Telephone Encounter (Signed)
Called [facility], confirmed they received rx sent 03/22/21. If pt looking online, it may have not been showing up. Advised pt needs to call customer service at [phone] and give ref# [id number] to ask for expedited shipment since he only have a few pills left. I called pt and LVM relaying above info. Asked him to call back if he has any further questions/concerns.

## 2021-04-04 NOTE — Telephone Encounter (Signed)
Pt called, [facility] mail service said have not received request for refill for divalproex (DEPAKOTE ER) 500 MG 24 hr tablet at . Can you resend the request for refill. Would like a call from the nurse to confirm prescription has been sent.  Pt only have a few pills left.

## 2021-04-05 ENCOUNTER — Ambulatory Visit: Payer: Medicare Other | Admitting: Podiatry

## 2021-04-08 ENCOUNTER — Other Ambulatory Visit: Payer: Self-pay

## 2021-04-08 ENCOUNTER — Encounter: Payer: Self-pay | Admitting: Podiatry

## 2021-04-08 ENCOUNTER — Ambulatory Visit: Payer: Medicare Other | Admitting: Podiatry

## 2021-04-08 DIAGNOSIS — B351 Tinea unguium: Secondary | ICD-10-CM

## 2021-04-08 DIAGNOSIS — M79674 Pain in right toe(s): Secondary | ICD-10-CM | POA: Diagnosis not present

## 2021-04-08 DIAGNOSIS — M79675 Pain in left toe(s): Secondary | ICD-10-CM

## 2021-04-08 NOTE — Progress Notes (Signed)
This patient returns to the office for evaluation and treatment of long thick painful nails .  This patient is unable to trim his own nails since the patient cannot reach his feet.  Patient says the nails are painful walking and wearing his shoes.  He returns for preventive foot care services.  General Appearance  Alert, conversant and in no acute stress.  Vascular  Dorsalis pedis and posterior tibial  pulses are palpable  bilaterally.  Capillary return is within normal limits  bilaterally. Temperature is within normal limits  bilaterally.  Neurologic  Senn-Weinstein monofilament wire test within normal limits  bilaterally. Muscle power within normal limits bilaterally.  Nails Thick disfigured discolored nails with subungual debris  from hallux to fifth toes bilaterally. No evidence of bacterial infection or drainage bilaterally.  Orthopedic  No limitations of motion  feet .  No crepitus or effusions noted.  No bony pathology or digital deformities noted.  Skin  normotropic skin with no porokeratosis noted bilaterally.  No signs of infections or ulcers noted.     Onychomycosis  Pain in toes right foot  Pain in toes left foot  Debridement  of nails  1-5  B/L with a nail nipper.  Nails were then filed using a dremel tool with no incidents.    RTC  10 weeks    Dessa Ledee DPM  

## 2021-05-20 DIAGNOSIS — L820 Inflamed seborrheic keratosis: Secondary | ICD-10-CM | POA: Diagnosis not present

## 2021-05-20 DIAGNOSIS — L0100 Impetigo, unspecified: Secondary | ICD-10-CM | POA: Diagnosis not present

## 2021-06-21 DIAGNOSIS — X32XXXD Exposure to sunlight, subsequent encounter: Secondary | ICD-10-CM | POA: Diagnosis not present

## 2021-06-21 DIAGNOSIS — D225 Melanocytic nevi of trunk: Secondary | ICD-10-CM | POA: Diagnosis not present

## 2021-06-21 DIAGNOSIS — D045 Carcinoma in situ of skin of trunk: Secondary | ICD-10-CM | POA: Diagnosis not present

## 2021-06-21 DIAGNOSIS — L570 Actinic keratosis: Secondary | ICD-10-CM | POA: Diagnosis not present

## 2021-06-21 DIAGNOSIS — L821 Other seborrheic keratosis: Secondary | ICD-10-CM | POA: Diagnosis not present

## 2021-07-06 ENCOUNTER — Telehealth: Payer: Self-pay | Admitting: Family Medicine

## 2021-07-06 MED ORDER — DIVALPROEX SODIUM ER 500 MG PO TB24: 500.0000 mg | ORAL_TABLET | Freq: Two times a day (BID) | ORAL | 2 refills | Status: DC

## 2021-07-06 NOTE — Telephone Encounter (Signed)
Pt called stating that he is having trouble getting a hold of [facility] and is wanting to know if his divalproex (DEPAKOTE ER) 500 MG 24 hr tablet can be switched over to the [facility] on [address] permanently and if he is able to get it in a 3 month supply. Please advise.

## 2021-07-06 NOTE — Telephone Encounter (Signed)
LVM for pt letting him know we will send rx to [facility] instead as requested. Asked him to call back if he has any further questions/concerns.

## 2021-07-14 DIAGNOSIS — Z961 Presence of intraocular lens: Secondary | ICD-10-CM | POA: Diagnosis not present

## 2021-07-14 DIAGNOSIS — H0102A Squamous blepharitis right eye, upper and lower eyelids: Secondary | ICD-10-CM | POA: Diagnosis not present

## 2021-07-14 DIAGNOSIS — H26492 Other secondary cataract, left eye: Secondary | ICD-10-CM | POA: Diagnosis not present

## 2021-07-14 DIAGNOSIS — H401111 Primary open-angle glaucoma, right eye, mild stage: Secondary | ICD-10-CM | POA: Diagnosis not present

## 2021-07-14 DIAGNOSIS — H0102B Squamous blepharitis left eye, upper and lower eyelids: Secondary | ICD-10-CM | POA: Diagnosis not present

## 2021-07-14 DIAGNOSIS — H401122 Primary open-angle glaucoma, left eye, moderate stage: Secondary | ICD-10-CM | POA: Diagnosis not present

## 2021-07-14 DIAGNOSIS — H2511 Age-related nuclear cataract, right eye: Secondary | ICD-10-CM | POA: Diagnosis not present

## 2021-07-14 DIAGNOSIS — H04123 Dry eye syndrome of bilateral lacrimal glands: Secondary | ICD-10-CM | POA: Diagnosis not present

## 2021-07-19 ENCOUNTER — Other Ambulatory Visit: Payer: Self-pay

## 2021-07-19 ENCOUNTER — Ambulatory Visit: Payer: Medicare Other | Admitting: Podiatry

## 2021-07-19 ENCOUNTER — Encounter: Payer: Self-pay | Admitting: Podiatry

## 2021-07-19 DIAGNOSIS — B351 Tinea unguium: Secondary | ICD-10-CM | POA: Diagnosis not present

## 2021-07-19 DIAGNOSIS — M79675 Pain in left toe(s): Secondary | ICD-10-CM

## 2021-07-19 DIAGNOSIS — M79674 Pain in right toe(s): Secondary | ICD-10-CM

## 2021-07-24 NOTE — Progress Notes (Signed)
Subjective: Gary Atkins is a 81 y.o. male patient seen today for follow up of  painful elongated mycotic toenails 1-5 bilaterally which are tender when wearing enclosed shoe gear. Pain is relieved with periodic professional debridement.  New problem(s)/concern(s) today: None    PCP is Biagio Borg, MD. Last visit was: 01/18/2021.  Allergies  Allergen Reactions   Demerol [Meperidine] Other (See Comments)    seizure    Objective: Physical Exam  General: Patient is a pleasant 81 y.o. male WD, WN in NAD. AAO x 3.   Neurovascular Examination: CFT immediate b/l LE. Palpable DP/PT pulses b/l LE. Digital hair sparse b/l. Skin temperature gradient WNL b/l. No pain with calf compression b/l. No edema noted b/l. No cyanosis or clubbing noted b/l LE.  Protective sensation intact 5/5 intact bilaterally with 10g monofilament b/l. Vibratory sensation intact b/l.  Dermatological:  Pedal integument with normal turgor, texture and tone b/l LE. No open wounds b/l. No interdigital macerations b/l. Toenails 1-5 b/l elongated, thickened, discolored with subungual debris. +Tenderness with dorsal palpation of nailplates. No hyperkeratotic or porokeratotic lesions present.  Musculoskeletal:  Normal muscle strength 5/5 to all lower extremity muscle groups bilaterally. No pain, crepitus or joint limitation noted with ROM b/l LE. No gross bony pedal deformities b/l. Patient ambulates independently without assistive aids.  Assessment: 1. Pain due to onychomycosis of toenails of both feet    Plan: Patient was evaluated and treated and all questions answered. Consent given for treatment as described below: -Examined patient. -Mycotic toenails 1-5 bilaterally were debrided in length and girth with sterile nail nippers and dremel without incident. -Patient/POA to call should there be question/concern in the interim.  Return in about 3 months (around 10/16/2021) for nail trim.  Marzetta Board, DPM

## 2021-07-30 ENCOUNTER — Other Ambulatory Visit: Payer: Self-pay | Admitting: Cardiology

## 2021-08-01 NOTE — Telephone Encounter (Signed)
Rx(s) sent to pharmacy electronically.  

## 2021-08-02 DIAGNOSIS — Z85828 Personal history of other malignant neoplasm of skin: Secondary | ICD-10-CM | POA: Diagnosis not present

## 2021-08-02 DIAGNOSIS — Z08 Encounter for follow-up examination after completed treatment for malignant neoplasm: Secondary | ICD-10-CM | POA: Diagnosis not present

## 2021-08-02 DIAGNOSIS — L720 Epidermal cyst: Secondary | ICD-10-CM | POA: Diagnosis not present

## 2021-08-02 DIAGNOSIS — L0100 Impetigo, unspecified: Secondary | ICD-10-CM | POA: Diagnosis not present

## 2021-09-30 ENCOUNTER — Ambulatory Visit: Payer: Medicare Other | Admitting: Podiatry

## 2021-09-30 DIAGNOSIS — M79675 Pain in left toe(s): Secondary | ICD-10-CM | POA: Diagnosis not present

## 2021-09-30 DIAGNOSIS — B351 Tinea unguium: Secondary | ICD-10-CM | POA: Diagnosis not present

## 2021-09-30 DIAGNOSIS — M79674 Pain in right toe(s): Secondary | ICD-10-CM | POA: Diagnosis not present

## 2021-10-09 NOTE — Progress Notes (Signed)
?  Subjective:  ?Patient ID: Gary Atkins, male    DOB: June 14, 1941,  MRN: [id number] ? ?Gary Atkins presents to clinic today for painful elongated mycotic toenails 1-5 bilaterally which are tender when wearing enclosed shoe gear. Pain is relieved with periodic professional debridement. ? ?New problem(s): None.  ? ?PCP is Biagio Borg, MD , and last visit was January 18, 2021. ? ?Allergies  ?Allergen Reactions  ? Demerol [Meperidine] Other (See Comments)  ?  seizure  ? ? ?Review of Systems: Negative except as noted in the HPI. ? ?Objective: No changes noted in today's physical examination. ?General: Patient is a pleasant 81 y.o. male WD, WN in NAD. AAO x 3.  ? ?Neurovascular Examination: ?CFT immediate b/l LE. Palpable DP/PT pulses b/l LE. Digital hair sparse b/l. Skin temperature gradient WNL b/l. No pain with calf compression b/l. No edema noted b/l. No cyanosis or clubbing noted b/l LE. ? ?Protective sensation intact 5/5 intact bilaterally with 10g monofilament b/l. Vibratory sensation intact b/l. ? ?Dermatological:  ?Pedal integument with normal turgor, texture and tone b/l LE. No open wounds b/l. No interdigital macerations b/l. Toenails 1-5 b/l elongated, thickened, discolored with subungual debris. +Tenderness with dorsal palpation of nailplates. Incurvated nailplate left great toe and R 3rd toe.  Nail border hypertrophy absent. There is tenderness to palpation. Sign(s) of infection: no clinical signs of infection noted on examination today.. No hyperkeratotic or porokeratotic lesions present. ? ?Musculoskeletal:  ?Normal muscle strength 5/5 to all lower extremity muscle groups bilaterally. No pain, crepitus or joint limitation noted with ROM b/l LE. No gross bony pedal deformities b/l. Patient ambulates independently without assistive aids. ? ? ?  Latest Ref Rng & Units 01/18/2021  ?  9:14 AM  ?Hemoglobin A1C  ?Hemoglobin-A1c 4.6 - 6.5 % 6.0    ? ?Assessment/Plan: ?1. Pain due to onychomycosis of toenails of  both feet   ?  ?-Patient was evaluated and treated. All patient's and/or POA's questions/concerns answered on today's visit. ?-Toenails 1-5 b/l were debrided in length and girth with sterile nail nippers and dremel without iatrogenic bleeding.  ?-Offending nail border debrided and curretaged left great toe and R 3rd toe utilizing sterile nail nipper and currette. Border(s) cleansed with alcohol and TAO applied. Patient instructed to apply Neosporin Cream  to left great toe and R 3rd toe once daily for 7 days. ?-Patient/POA to call should there be question/concern in the interim.  ? ?Return in about 3 months (around 12/30/2021). ? ?Marzetta Board, DPM  ?

## 2021-11-18 DIAGNOSIS — L570 Actinic keratosis: Secondary | ICD-10-CM | POA: Diagnosis not present

## 2021-11-18 DIAGNOSIS — B353 Tinea pedis: Secondary | ICD-10-CM | POA: Diagnosis not present

## 2021-11-18 DIAGNOSIS — C44329 Squamous cell carcinoma of skin of other parts of face: Secondary | ICD-10-CM | POA: Diagnosis not present

## 2021-11-18 DIAGNOSIS — X32XXXD Exposure to sunlight, subsequent encounter: Secondary | ICD-10-CM | POA: Diagnosis not present

## 2021-12-16 ENCOUNTER — Other Ambulatory Visit: Payer: Self-pay | Admitting: Cardiology

## 2021-12-16 NOTE — Telephone Encounter (Signed)
Rx request sent to pharmacy.  

## 2021-12-21 DIAGNOSIS — L570 Actinic keratosis: Secondary | ICD-10-CM | POA: Diagnosis not present

## 2021-12-21 DIAGNOSIS — X32XXXD Exposure to sunlight, subsequent encounter: Secondary | ICD-10-CM | POA: Diagnosis not present

## 2021-12-21 DIAGNOSIS — C44329 Squamous cell carcinoma of skin of other parts of face: Secondary | ICD-10-CM | POA: Diagnosis not present

## 2022-01-02 ENCOUNTER — Encounter: Payer: Self-pay | Admitting: Podiatry

## 2022-01-02 ENCOUNTER — Ambulatory Visit: Payer: Medicare Other | Admitting: Podiatry

## 2022-01-02 DIAGNOSIS — B351 Tinea unguium: Secondary | ICD-10-CM | POA: Diagnosis not present

## 2022-01-02 DIAGNOSIS — M79675 Pain in left toe(s): Secondary | ICD-10-CM

## 2022-01-02 DIAGNOSIS — M79674 Pain in right toe(s): Secondary | ICD-10-CM | POA: Diagnosis not present

## 2022-01-08 NOTE — Progress Notes (Signed)
  Subjective:  Patient ID: Gary Atkins, male    DOB: 1940/08/31,  MRN: [id number]  Gary Atkins presents to clinic today for painful thick toenails that are difficult to trim. Pain interferes with ambulation. Aggravating factors include wearing enclosed shoe gear. Pain is relieved with periodic professional debridement.  New problem(s): None.   PCP is Biagio Borg, MD , and last visit was  January 18, 2021  Allergies  Allergen Reactions   Demerol [Meperidine] Other (See Comments)    seizure    Review of Systems: Negative except as noted in the HPI.  Objective: No changes noted in today's physical examination. General: Patient is a pleasant 81 y.o. male WD, WN in NAD. AAO x 3.   Neurovascular Examination: CFT immediate b/l LE. Palpable DP/PT pulses b/l LE. Digital hair sparse b/l. Skin temperature gradient WNL b/l. No pain with calf compression b/l. No edema noted b/l. No cyanosis or clubbing noted b/l LE.  Protective sensation intact 5/5 intact bilaterally with 10g monofilament b/l. Vibratory sensation intact b/l.  Dermatological:  Pedal integument with normal turgor, texture and tone b/l LE. No open wounds b/l. No interdigital macerations b/l. Toenails 1-5 b/l elongated, thickened, discolored with subungual debris. +Tenderness with dorsal palpation of nailplates. Incurvated nailplate left great toe bilateral borders. Nail border hypertrophy absent. There is tenderness to palpation. Sign(s) of infection: no clinical signs of infection noted on examination today. No hyperkeratotic or porokeratotic lesions present.  Musculoskeletal:  Normal muscle strength 5/5 to all lower extremity muscle groups bilaterally. No pain, crepitus or joint limitation noted with ROM b/l LE. No gross bony pedal deformities b/l. Patient ambulates independently without assistive aids.  Assessment/Plan: 1. Pain due to onychomycosis of toenails of both feet     -Examined patient. -Mycotic toenails 1-5  bilaterally were debrided in length and girth with sterile nail nippers and dremel without incident. -Offending nail border debrided and curretaged left great toe utilizing sterile nail nipper and currette. Border cleansed with alcohol and triple antibiotic applied. No further treatment required by patient/caregiver. Call office if there are any concerns. -Patient/POA to call should there be question/concern in the interim.   Return in about 3 months (around 04/04/2022).  Marzetta Board, DPM

## 2022-01-20 ENCOUNTER — Ambulatory Visit (INDEPENDENT_AMBULATORY_CARE_PROVIDER_SITE_OTHER): Payer: Medicare Other

## 2022-01-20 ENCOUNTER — Ambulatory Visit (INDEPENDENT_AMBULATORY_CARE_PROVIDER_SITE_OTHER): Payer: Medicare Other | Admitting: Internal Medicine

## 2022-01-20 ENCOUNTER — Encounter: Payer: Self-pay | Admitting: Internal Medicine

## 2022-01-20 ENCOUNTER — Telehealth: Payer: Self-pay | Admitting: Internal Medicine

## 2022-01-20 VITALS — BP 118/70 | HR 68 | Temp 98.1°F | Ht 69.0 in | Wt 174.2 lb

## 2022-01-20 DIAGNOSIS — R0609 Other forms of dyspnea: Secondary | ICD-10-CM

## 2022-01-20 DIAGNOSIS — E7800 Pure hypercholesterolemia, unspecified: Secondary | ICD-10-CM | POA: Diagnosis not present

## 2022-01-20 DIAGNOSIS — R569 Unspecified convulsions: Secondary | ICD-10-CM

## 2022-01-20 DIAGNOSIS — I2510 Atherosclerotic heart disease of native coronary artery without angina pectoris: Secondary | ICD-10-CM

## 2022-01-20 DIAGNOSIS — J9811 Atelectasis: Secondary | ICD-10-CM | POA: Diagnosis not present

## 2022-01-20 DIAGNOSIS — R739 Hyperglycemia, unspecified: Secondary | ICD-10-CM

## 2022-01-20 DIAGNOSIS — R059 Cough, unspecified: Secondary | ICD-10-CM | POA: Diagnosis not present

## 2022-01-20 DIAGNOSIS — E559 Vitamin D deficiency, unspecified: Secondary | ICD-10-CM | POA: Diagnosis not present

## 2022-01-20 DIAGNOSIS — E538 Deficiency of other specified B group vitamins: Secondary | ICD-10-CM | POA: Diagnosis not present

## 2022-01-20 DIAGNOSIS — Z0001 Encounter for general adult medical examination with abnormal findings: Secondary | ICD-10-CM | POA: Diagnosis not present

## 2022-01-20 DIAGNOSIS — R0602 Shortness of breath: Secondary | ICD-10-CM | POA: Diagnosis not present

## 2022-01-20 LAB — CBC WITH DIFFERENTIAL/PLATELET
Basophils Absolute: 0 10*3/uL (ref 0.0–0.1)
Basophils Relative: 0.7 % (ref 0.0–3.0)
Eosinophils Absolute: 0.3 10*3/uL (ref 0.0–0.7)
Eosinophils Relative: 4.5 % (ref 0.0–5.0)
HCT: 49.8 % (ref 39.0–52.0)
Hemoglobin: 16.9 g/dL (ref 13.0–17.0)
Lymphocytes Relative: 35 % (ref 12.0–46.0)
Lymphs Abs: 2.5 10*3/uL (ref 0.7–4.0)
MCHC: 33.9 g/dL (ref 30.0–36.0)
MCV: 96.7 fl (ref 78.0–100.0)
Monocytes Absolute: 0.7 10*3/uL (ref 0.1–1.0)
Monocytes Relative: 10.1 % (ref 3.0–12.0)
Neutro Abs: 3.5 10*3/uL (ref 1.4–7.7)
Neutrophils Relative %: 49.7 % (ref 43.0–77.0)
Platelets: 131 10*3/uL — ABNORMAL LOW (ref 150.0–400.0)
RBC: 5.15 Mil/uL (ref 4.22–5.81)
RDW: 13.9 % (ref 11.5–15.5)
WBC: 7.1 10*3/uL (ref 4.0–10.5)

## 2022-01-20 LAB — LIPID PANEL
Cholesterol: 148 mg/dL (ref 0–200)
HDL: 47.3 mg/dL (ref >39.00)
LDL Cholesterol: 81 mg/dL (ref 0–99)
NonHDL: 101
Total CHOL/HDL Ratio: 3
Triglycerides: 101 mg/dL (ref 0.0–149.0)
VLDL: 20.2 mg/dL (ref 0.0–40.0)

## 2022-01-20 LAB — HEMOGLOBIN A1C: Hgb A1c MFr Bld: 6 % (ref 4.6–6.5)

## 2022-01-20 LAB — URINALYSIS, ROUTINE W REFLEX MICROSCOPIC
Bilirubin Urine: NEGATIVE
Hgb urine dipstick: NEGATIVE
Ketones, ur: 15 — AB
Leukocytes,Ua: NEGATIVE
Nitrite: NEGATIVE
Specific Gravity, Urine: >=1.03 — AB (ref 1.000–1.030)
Total Protein, Urine: NEGATIVE
Urine Glucose: NEGATIVE
Urobilinogen, UA: 1 (ref 0.0–1.0)
pH: 5.5 (ref 5.0–8.0)

## 2022-01-20 LAB — BASIC METABOLIC PANEL
BUN: 23 mg/dL (ref 6–23)
CO2: 28 mEq/L (ref 19–32)
Calcium: 9.9 mg/dL (ref 8.4–10.5)
Chloride: 102 mEq/L (ref 96–112)
Creatinine, Ser: 1.14 mg/dL (ref 0.40–1.50)
GFR: 59.75 mL/min — ABNORMAL LOW (ref >60.00)
Glucose, Bld: 100 mg/dL — ABNORMAL HIGH (ref 70–99)
Potassium: 4.7 mEq/L (ref 3.5–5.1)
Sodium: 138 mEq/L (ref 135–145)

## 2022-01-20 LAB — HEPATIC FUNCTION PANEL
ALT: 24 U/L (ref 0–53)
AST: 23 U/L (ref 0–37)
Albumin: 4.4 g/dL (ref 3.5–5.2)
Alkaline Phosphatase: 52 U/L (ref 39–117)
Bilirubin, Direct: 0.3 mg/dL (ref 0.0–0.3)
Total Bilirubin: 1.2 mg/dL (ref 0.2–1.2)
Total Protein: 7.8 g/dL (ref 6.0–8.3)

## 2022-01-20 LAB — VITAMIN B12: Vitamin B-12: >1500 pg/mL — ABNORMAL HIGH (ref 211–911)

## 2022-01-20 LAB — VITAMIN D 25 HYDROXY (VIT D DEFICIENCY, FRACTURES): VITD: 42.29 ng/mL (ref 30.00–100.00)

## 2022-01-20 LAB — BRAIN NATRIURETIC PEPTIDE: Pro B Natriuretic peptide (BNP): 68 pg/mL (ref 0.0–100.0)

## 2022-01-20 LAB — TSH: TSH: 2.78 u[IU]/mL (ref 0.35–5.50)

## 2022-01-20 MED ORDER — LISINOPRIL 5 MG PO TABS: 5.0000 mg | ORAL_TABLET | Freq: Every day | ORAL | 3 refills | Status: DC

## 2022-01-20 MED ORDER — ATORVASTATIN CALCIUM 20 MG PO TABS: ORAL_TABLET | ORAL | 3 refills | Status: DC

## 2022-01-20 MED ORDER — DIVALPROEX SODIUM ER 500 MG PO TB24
500.0000 mg | ORAL_TABLET | Freq: Two times a day (BID) | ORAL | 3 refills | Status: DC
Start: 2022-01-20 — End: 2022-01-20

## 2022-01-20 MED ORDER — CARVEDILOL 6.25 MG PO TABS: 6.2500 mg | ORAL_TABLET | Freq: Two times a day (BID) | ORAL | 3 refills | Status: DC

## 2022-01-20 MED ORDER — DIVALPROEX SODIUM ER 500 MG PO TB24: 500.0000 mg | ORAL_TABLET | Freq: Two times a day (BID) | ORAL | 3 refills | Status: DC
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
## 2022-01-20 NOTE — Patient Instructions (Addendum)
Please continue all other medications as before, and refills have been done if requested.  Please have the pharmacy call with any other refills you may need.  Please continue your efforts at being more active, low cholesterol diet, and weight control.  You are otherwise up to date with prevention measures today.  Please keep your appointments with your specialists as you may have planned - Dr Harrell Gave in Sept 2023  Please go to the XRAY Department in the first floor for the x-ray testing  Please go to the LAB at the blood drawing area for the tests to be done  You will be contacted by phone if any changes need to be made immediately.  Otherwise, you will receive a letter about your results with an explanation, but please check with MyChart first.  Please remember to sign up for MyChart if you have not done so, as this will be important to you in the future with finding out test results, communicating by private email, and scheduling acute appointments online when needed.  Please make an Appointment to return in 6 months, or sooner if needed

## 2022-01-20 NOTE — Telephone Encounter (Signed)
Pt noticed on AVS that rx were sent to [facility] today.   States he does not use that pharmacy, and would like his meds to be sent to:  [place], [place] Phone:  [phone]  Fax:  [phone]

## 2022-01-20 NOTE — Telephone Encounter (Signed)
Updated pharmacy sent rx's to [facility].Marland KitchenJohny Chess

## 2022-01-21 ENCOUNTER — Other Ambulatory Visit: Payer: Self-pay | Admitting: Cardiology

## 2022-01-21 LAB — VALPROIC ACID LEVEL: Valproic Acid Lvl: 87.3 mg/L (ref 50.0–100.0)

## 2022-01-22 ENCOUNTER — Encounter: Payer: Self-pay | Admitting: Internal Medicine

## 2022-01-22 NOTE — Assessment & Plan Note (Signed)
None recent, also for depakote level

## 2022-01-22 NOTE — Assessment & Plan Note (Signed)
Exam benign, ok for bmp and cxr

## 2022-01-22 NOTE — Assessment & Plan Note (Signed)
Lab Results  Component Value Date   HGBA1C 6.0 01/20/2022   Stable, pt to continue current medical treatment  - diet, wt control, excercise  

## 2022-01-22 NOTE — Assessment & Plan Note (Signed)
Age and sex appropriate education and counseling updated with regular exercise and diet Referrals for preventative services - none needed Immunizations addressed - for shingrix and tdap at the local pharmacy Smoking counseling  - none needed Evidence for depression or other mood disorder - none significant Most recent labs reviewed. I have personally reviewed and have noted: 1) the patient's medical and social history 2) The patient's current medications and supplements 3) The patient's height, weight, and BMI have been recorded in the chart

## 2022-01-22 NOTE — Assessment & Plan Note (Signed)
Lab Results  Component Value Date   LDLCALC 81 01/20/2022   uncontrolled, goal ldl < 70, pt to continue current statin liptior 20 mg as wants to work on lower chol diet as well

## 2022-01-31 DIAGNOSIS — X32XXXD Exposure to sunlight, subsequent encounter: Secondary | ICD-10-CM | POA: Diagnosis not present

## 2022-01-31 DIAGNOSIS — D0439 Carcinoma in situ of skin of other parts of face: Secondary | ICD-10-CM | POA: Diagnosis not present

## 2022-01-31 DIAGNOSIS — L218 Other seborrheic dermatitis: Secondary | ICD-10-CM | POA: Diagnosis not present

## 2022-01-31 DIAGNOSIS — C44329 Squamous cell carcinoma of skin of other parts of face: Secondary | ICD-10-CM | POA: Diagnosis not present

## 2022-01-31 DIAGNOSIS — L570 Actinic keratosis: Secondary | ICD-10-CM | POA: Diagnosis not present

## 2022-02-28 DIAGNOSIS — L570 Actinic keratosis: Secondary | ICD-10-CM | POA: Diagnosis not present

## 2022-02-28 DIAGNOSIS — Z85828 Personal history of other malignant neoplasm of skin: Secondary | ICD-10-CM | POA: Diagnosis not present

## 2022-02-28 DIAGNOSIS — Z08 Encounter for follow-up examination after completed treatment for malignant neoplasm: Secondary | ICD-10-CM | POA: Diagnosis not present

## 2022-02-28 DIAGNOSIS — X32XXXD Exposure to sunlight, subsequent encounter: Secondary | ICD-10-CM | POA: Diagnosis not present

## 2022-03-13 ENCOUNTER — Ambulatory Visit (HOSPITAL_BASED_OUTPATIENT_CLINIC_OR_DEPARTMENT_OTHER): Payer: Medicare Other | Admitting: Cardiology

## 2022-03-13 ENCOUNTER — Encounter (HOSPITAL_BASED_OUTPATIENT_CLINIC_OR_DEPARTMENT_OTHER): Payer: Self-pay | Admitting: Cardiology

## 2022-03-13 VITALS — BP 110/70 | HR 76 | Ht 69.0 in | Wt 176.9 lb

## 2022-03-13 DIAGNOSIS — I2510 Atherosclerotic heart disease of native coronary artery without angina pectoris: Secondary | ICD-10-CM | POA: Diagnosis not present

## 2022-03-13 DIAGNOSIS — I5042 Chronic combined systolic (congestive) and diastolic (congestive) heart failure: Secondary | ICD-10-CM | POA: Diagnosis not present

## 2022-03-13 DIAGNOSIS — I493 Ventricular premature depolarization: Secondary | ICD-10-CM | POA: Diagnosis not present

## 2022-03-13 DIAGNOSIS — I428 Other cardiomyopathies: Secondary | ICD-10-CM
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
## 2022-03-13 NOTE — Patient Instructions (Signed)
Medication Instructions:  Your Physician recommend you continue on your current medication as directed.    *If you need a refill on your cardiac medications before your next appointment, please call your pharmacy*   Lab Work: None ordered today   Testing/Procedures: None ordered today   Follow-Up: At [facility], you and your health needs are our priority.  As part of our continuing mission to provide you with exceptional heart care, we have created designated Provider Care Teams.  These Care Teams include your primary Cardiologist (physician) and Advanced Practice Providers (APPs -  Physician Assistants and Nurse Practitioners) who all work together to provide you with the care you need, when you need it.  We recommend signing up for the patient portal called "MyChart".  Sign up information is provided on this After Visit Summary.  MyChart is used to connect with patients for Virtual Visits (Telemedicine).  Patients are able to view lab/test results, encounter notes, upcoming appointments, etc.  Non-urgent messages can be sent to your provider as well.   To learn more about what you can do with MyChart, go to [email or web address].    Your next appointment:   1 year(s)  The format for your next appointment:   In Person  Provider:   Bridgette Christopher, MD          

## 2022-03-20 NOTE — Patient Instructions (Incomplete)
Below is our plan:  We will continue to monitor symptoms. Continue divalproex ER 500mg  twice daily per PCP. I will refer you to PT for the low back pain. I am hopeful this will help with strengthening exercises to help with pain. May consider lumbar imaging if needed.   Document events of left arm shaking with feeling of being hot. Document frequency of occurrence and length of time events are happening. Record any other concerning events that may occur. We can order EEG in office or in the home if needed.   Please make sure you are consistent with timing of seizure medication. I recommend annual visit with primary care provider (PCP) for complete physical and routine blood work. I recommend daily intake of vitamin D (400-800iu) and calcium (800-1000mg ) for bone health. Discuss Dexa screening with PCP.   According to [place] law, you can not drive unless you are seizure / syncope free for at least 6 months and under physician's care.  Please maintain precautions. Do not participate in activities where a loss of awareness could harm you or someone else. No swimming alone, no tub bathing, no hot tubs, no driving, no operating motorized vehicles (cars, ATVs, motocycles, etc), lawnmowers, power tools or firearms. No standing at heights, such as rooftops, ladders or stairs. Avoid hot objects such as stoves, heaters, open fires. Wear a helmet when riding a bicycle, scooter, skateboard, etc. and avoid areas of traffic. Set your water heater to 120 degrees or less.  Please make sure you are staying well hydrated. I recommend 50-60 ounces daily. Well balanced diet and regular exercise encouraged. Consistent sleep schedule with 6-8 hours recommended.   Please continue follow up with care team as directed.   Follow up with Dr Epimenio Foot in 4 months    You may receive a survey regarding today's visit. I encourage you to leave honest feed back as I do use this information to improve patient care. Thank you for seeing me  today!   Management of Memory Problems   There are some general things you can do to help manage your memory problems.  Your memory may not in fact recover, but by using techniques and strategies you will be able to manage your memory difficulties better.   1)  Establish a routine. Try to establish and then stick to a regular routine.  By doing this, you will get used to what to expect and you will reduce the need to rely on your memory.  Also, try to do things at the same time of day, such as taking your medication or checking your calendar first thing in the morning. Think about think that you can do as a part of a regular routine and make a list.  Then enter them into a daily planner to remind you.  This will help you establish a routine.   2)  Organize your environment. Organize your environment so that it is uncluttered.  Decrease visual stimulation.  Place everyday items such as keys or cell phone in the same place every day (ie.  Basket next to front door) Use post it notes with a brief message to yourself (ie. Turn off light, lock the door) Use labels to indicate where things go (ie. Which cupboards are for food, dishes, etc.) Keep a notepad and pen by the telephone to take messages   3)  Memory Aids A diary or journal/notebook/daily planner Making a list (shopping list, chore list, to do list that needs to be done) Using  an alarm as a reminder (kitchen timer or cell phone alarm) Using cell phone to store information (Notes, Calendar, Reminders) Calendar/White board placed in a prominent position Post-it notes   In order for memory aids to be useful, you need to have good habits.  It's no good remembering to make a note in your journal if you don't remember to look in it.  Try setting aside a certain time of day to look in journal.   4)  Improving mood and managing fatigue. There may be other factors that contribute to memory difficulties.  Factors, such as anxiety, depression and  tiredness can affect memory. Regular gentle exercise can help improve your mood and give you more energy. Exercise: there are short videos created by the [employer] on Health specially for older adults: https://bit.ly/2I30q97.  Mediterranean diet: which emphasizes fruits, vegetables, whole grains, legumes, fish, and other seafood; unsaturated fats such as olive oils; and low amounts of red meat, eggs, and sweets. A variation of this, called MIND (Mediterranean-DASH Intervention for Neurodegenerative Delay) incorporates the DASH (Dietary Approaches to Stop Hypertension) diet, which has been shown to lower high blood pressure, a risk factor for Alzheimer's disease. More information at: ExitMarketing.de.  Aerobic exercise that improve heart health is also good for the mind.  [employer] on Aging have short videos for exercises that you can do at home: BlindWorkshop.com.pt Simple relaxation techniques may help relieve symptoms of anxiety Try to get back to completing activities or hobbies you enjoyed doing in the past. Learn to pace yourself through activities to decrease fatigue. Find out about some local support groups where you can share experiences with others. Try and achieve 7-8 hours of sleep at night.
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
## 2022-03-22 ENCOUNTER — Encounter: Payer: Self-pay | Admitting: Family Medicine

## 2022-03-22 ENCOUNTER — Ambulatory Visit: Payer: Medicare Other | Admitting: Family Medicine

## 2022-03-22 ENCOUNTER — Telehealth: Payer: Self-pay | Admitting: Family Medicine

## 2022-03-22 VITALS — BP 100/70 | HR 71 | Ht 69.0 in | Wt 177.2 lb

## 2022-03-22 DIAGNOSIS — M545 Low back pain, unspecified: Secondary | ICD-10-CM | POA: Diagnosis not present

## 2022-03-22 DIAGNOSIS — R569 Unspecified convulsions: Secondary | ICD-10-CM | POA: Diagnosis not present

## 2022-03-22 DIAGNOSIS — R413 Other amnesia: Secondary | ICD-10-CM | POA: Diagnosis not present

## 2022-03-22 DIAGNOSIS — G8929 Other chronic pain: Secondary | ICD-10-CM | POA: Diagnosis not present

## 2022-03-22 NOTE — Telephone Encounter (Signed)
Referral for physical therapy sent through Epic to [facility]

## 2022-03-28 ENCOUNTER — Encounter: Payer: Self-pay | Admitting: Physical Therapy

## 2022-03-28 ENCOUNTER — Other Ambulatory Visit: Payer: Self-pay

## 2022-03-28 ENCOUNTER — Ambulatory Visit: Payer: Medicare Other | Attending: Internal Medicine | Admitting: Physical Therapy

## 2022-03-28 VITALS — BP 94/64 | HR 74

## 2022-03-28 DIAGNOSIS — G8929 Other chronic pain: Secondary | ICD-10-CM | POA: Insufficient documentation

## 2022-03-28 DIAGNOSIS — M62830 Muscle spasm of back: Secondary | ICD-10-CM | POA: Insufficient documentation

## 2022-03-28 DIAGNOSIS — M5459 Other low back pain: Secondary | ICD-10-CM | POA: Diagnosis not present

## 2022-03-28 DIAGNOSIS — M545 Low back pain, unspecified: Secondary | ICD-10-CM | POA: Insufficient documentation
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
## 2022-03-31 ENCOUNTER — Encounter: Payer: Self-pay | Admitting: Podiatry

## 2022-04-04 DIAGNOSIS — H2511 Age-related nuclear cataract, right eye: Secondary | ICD-10-CM | POA: Diagnosis not present

## 2022-04-04 DIAGNOSIS — H0102B Squamous blepharitis left eye, upper and lower eyelids: Secondary | ICD-10-CM | POA: Diagnosis not present

## 2022-04-04 DIAGNOSIS — H04123 Dry eye syndrome of bilateral lacrimal glands: Secondary | ICD-10-CM | POA: Diagnosis not present

## 2022-04-04 DIAGNOSIS — H26492 Other secondary cataract, left eye: Secondary | ICD-10-CM | POA: Diagnosis not present

## 2022-04-04 DIAGNOSIS — Z961 Presence of intraocular lens: Secondary | ICD-10-CM | POA: Diagnosis not present

## 2022-04-04 DIAGNOSIS — H401122 Primary open-angle glaucoma, left eye, moderate stage: Secondary | ICD-10-CM | POA: Diagnosis not present

## 2022-04-04 DIAGNOSIS — H0102A Squamous blepharitis right eye, upper and lower eyelids: Secondary | ICD-10-CM | POA: Diagnosis not present

## 2022-04-05 ENCOUNTER — Encounter: Payer: Self-pay | Admitting: Physical Therapy

## 2022-04-05 ENCOUNTER — Ambulatory Visit: Payer: Medicare Other | Admitting: Physical Therapy

## 2022-04-05 DIAGNOSIS — M5459 Other low back pain: Secondary | ICD-10-CM | POA: Diagnosis not present

## 2022-04-05 DIAGNOSIS — G8929 Other chronic pain: Secondary | ICD-10-CM | POA: Diagnosis not present

## 2022-04-05 DIAGNOSIS — M62830 Muscle spasm of back: Secondary | ICD-10-CM | POA: Diagnosis not present

## 2022-04-05 DIAGNOSIS — M545 Low back pain, unspecified: Secondary | ICD-10-CM | POA: Diagnosis not present
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
## 2022-04-05 NOTE — Patient Instructions (Signed)
Access Code: [email or web address] URL: [email or web address] Date: 04/05/2022 Prepared by: Elease Etienne  Exercises - Supine Bridge  - 1 x daily - 4-5 x weekly - 2 sets - 10 reps - Supine March  - 1 x daily - 4-5 x weekly - 2 sets - 20 reps - Supine Lower Trunk Rotation  - 1 x daily - 4-5 x weekly - 1 sets - 20 reps - Clamshell with Resistance  - 1 x daily - 4-5 x weekly - 2 sets - 10 reps - Sit to Stand  - 1 x daily - 5 x weekly - 2 sets - 10 reps

## 2022-04-10 ENCOUNTER — Encounter: Payer: Self-pay | Admitting: Podiatry

## 2022-04-10 ENCOUNTER — Ambulatory Visit: Payer: Medicare Other | Admitting: Podiatry

## 2022-04-10 DIAGNOSIS — M79674 Pain in right toe(s): Secondary | ICD-10-CM

## 2022-04-10 DIAGNOSIS — B351 Tinea unguium: Secondary | ICD-10-CM | POA: Diagnosis not present

## 2022-04-10 DIAGNOSIS — M79675 Pain in left toe(s): Secondary | ICD-10-CM | POA: Diagnosis not present

## 2022-04-10 NOTE — Progress Notes (Signed)
This patient returns to the office for evaluation and treatment of long thick painful nails .  This patient is unable to trim his own nails since the patient cannot reach his feet.  Patient says the nails are painful walking and wearing his shoes.  He returns for preventive foot care services.  General Appearance  Alert, conversant and in no acute stress.  Vascular  Dorsalis pedis and posterior tibial  pulses are palpable  bilaterally.  Capillary return is within normal limits  bilaterally. Temperature is within normal limits  bilaterally.  Neurologic  Senn-Weinstein monofilament wire test within normal limits  bilaterally. Muscle power within normal limits bilaterally.  Nails Thick disfigured discolored nails with subungual debris  from hallux to fifth toes bilaterally. No evidence of bacterial infection or drainage bilaterally.  Orthopedic  No limitations of motion  feet .  No crepitus or effusions noted.  No bony pathology or digital deformities noted.  Skin  normotropic skin with no porokeratosis noted bilaterally.  No signs of infections or ulcers noted.     Onychomycosis  Pain in toes right foot  Pain in toes left foot  Debridement  of nails  1-5  B/L with a nail nipper.  Nails were then filed using a dremel tool with no incidents.    RTC  10 weeks    Kip Cropp DPM  

## 2022-04-18 ENCOUNTER — Ambulatory Visit: Payer: Medicare Other | Admitting: Physical Therapy

## 2022-04-18 ENCOUNTER — Encounter: Payer: Self-pay | Admitting: Physical Therapy

## 2022-04-18 DIAGNOSIS — M5459 Other low back pain: Secondary | ICD-10-CM

## 2022-04-18 DIAGNOSIS — M62830 Muscle spasm of back: Secondary | ICD-10-CM

## 2022-04-18 DIAGNOSIS — G8929 Other chronic pain: Secondary | ICD-10-CM | POA: Diagnosis not present

## 2022-04-18 DIAGNOSIS — M545 Low back pain, unspecified: Secondary | ICD-10-CM | POA: Diagnosis not present
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
## 2022-05-02 ENCOUNTER — Ambulatory Visit: Payer: Medicare Other | Attending: Internal Medicine | Admitting: Physical Therapy

## 2022-05-02 ENCOUNTER — Encounter: Payer: Self-pay | Admitting: Physical Therapy

## 2022-05-02 DIAGNOSIS — M62830 Muscle spasm of back: Secondary | ICD-10-CM | POA: Diagnosis not present

## 2022-05-02 DIAGNOSIS — M5459 Other low back pain: Secondary | ICD-10-CM | POA: Diagnosis not present
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
## 2022-05-02 NOTE — Patient Instructions (Signed)
Chair yoga to supplement activity and stretching:  -Seated cat/cow  -Seated sun salutation w/ twist  -Seated side leans  -Seated ankle to knee  -Seated goddess w/ twist  -Seated forward fold

## 2022-05-14 ENCOUNTER — Emergency Department (HOSPITAL_COMMUNITY): Payer: Medicare Other

## 2022-05-14 ENCOUNTER — Emergency Department (HOSPITAL_COMMUNITY)
Admission: EM | Admit: 2022-05-14 | Discharge: 2022-05-14 | Disposition: A | Discharge status: Home / Self Care | Payer: Medicare Other | Attending: Emergency Medicine | Admitting: Emergency Medicine

## 2022-05-14 ENCOUNTER — Other Ambulatory Visit: Payer: Self-pay

## 2022-05-14 DIAGNOSIS — T699XXA Effect of reduced temperature, unspecified, initial encounter: Secondary | ICD-10-CM | POA: Diagnosis not present

## 2022-05-14 DIAGNOSIS — S199XXA Unspecified injury of neck, initial encounter: Secondary | ICD-10-CM | POA: Diagnosis not present

## 2022-05-14 DIAGNOSIS — S0101XA Laceration without foreign body of scalp, initial encounter: Secondary | ICD-10-CM | POA: Diagnosis not present

## 2022-05-14 DIAGNOSIS — Z23 Encounter for immunization: Secondary | ICD-10-CM | POA: Diagnosis not present

## 2022-05-14 DIAGNOSIS — W19XXXA Unspecified fall, initial encounter: Secondary | ICD-10-CM | POA: Insufficient documentation

## 2022-05-14 DIAGNOSIS — R6889 Other general symptoms and signs: Secondary | ICD-10-CM | POA: Diagnosis not present

## 2022-05-14 DIAGNOSIS — M545 Low back pain, unspecified: Secondary | ICD-10-CM | POA: Insufficient documentation

## 2022-05-14 DIAGNOSIS — Z743 Need for continuous supervision: Secondary | ICD-10-CM | POA: Diagnosis not present

## 2022-05-14 DIAGNOSIS — M48061 Spinal stenosis, lumbar region without neurogenic claudication: Secondary | ICD-10-CM | POA: Diagnosis not present

## 2022-05-14 DIAGNOSIS — R9431 Abnormal electrocardiogram [ECG] [EKG]: Secondary | ICD-10-CM | POA: Diagnosis not present

## 2022-05-14 DIAGNOSIS — Z7982 Long term (current) use of aspirin: Secondary | ICD-10-CM | POA: Diagnosis not present

## 2022-05-14 DIAGNOSIS — R58 Hemorrhage, not elsewhere classified: Secondary | ICD-10-CM | POA: Diagnosis not present

## 2022-05-14 DIAGNOSIS — R519 Headache, unspecified: Secondary | ICD-10-CM | POA: Diagnosis not present

## 2022-05-14 DIAGNOSIS — Z043 Encounter for examination and observation following other accident: Secondary | ICD-10-CM | POA: Diagnosis not present

## 2022-05-14 DIAGNOSIS — S0003XA Contusion of scalp, initial encounter: Secondary | ICD-10-CM | POA: Diagnosis not present

## 2022-05-14 DIAGNOSIS — S0990XA Unspecified injury of head, initial encounter: Secondary | ICD-10-CM | POA: Diagnosis not present

## 2022-05-14 MED ORDER — TETANUS-DIPHTH-ACELL PERTUSSIS 5-2.5-18.5 LF-MCG/0.5 IM SUSY
0.5000 mL | PREFILLED_SYRINGE | Freq: Once | INTRAMUSCULAR | Status: AC
  Administered 2022-05-14: 0.5 mL via INTRAMUSCULAR
  Filled 2022-05-14: qty 0.5

## 2022-05-14 MED ORDER — LIDOCAINE-EPINEPHRINE (PF) 2 %-1:200000 IJ SOLN
20.0000 mL | Freq: Once | INTRAMUSCULAR | Status: AC
  Administered 2022-05-14: 20 mL
  Filled 2022-05-14: qty 20

## 2022-05-14 NOTE — ED Provider Triage Note (Signed)
Emergency Medicine Provider Triage Evaluation Note  Gary Atkins , a 81 y.o. male  was evaluated in triage.  Pt complains of mechanical, nonsyncopal fall, head injury.  Patient reports that he was walking to his room, when he likely tripped over a rug in the room, and felt himself he can fall.  Patient reports that he fell backwards, struck his head, he thinks on a chair in his room.  Patient reports a remote history of seizures 13 years ago, but no recent seizures.  He is alert and oriented x 4 on exam, no witnessed tonic-clonic activity at home.  He denies any chest pain, shortness of breath, confusion.  He endorses some pain in the back of the head, and low back.  He is able to ambulate without difficulty.  He takes aspirin but no other blood thinners.  Review of Systems  Positive: Head injury, laceration Negative: Chest pain, shortness of breath, confusion  Physical Exam  BP 112/71   Pulse 79   Temp 97.9 F (36.6 C)   Resp 17   SpO2 98%  Gen:   Awake, no distress   Resp:  Normal effort  MSK:   Moves extremities without difficulty  Other:  Patient with large laceration to the left occiput of the scalp, it is clotted at time of my evaluation, however after cleaning begins to bleed again, Cranial nerves II through XII grossly intact.  Intact finger-nose, intact heel-to-shin.  Romberg negative, gait normal.  Alert and oriented x3.  Moves all 4 limbs spontaneously, normal coordination.  No pronator drift.  Intact strength 5 out of 5 bilateral upper and lower extremities.   Medical Decision Making  Medically screening exam initiated at 1:34 AM.  Appropriate orders placed.  Benjerman A Bruinsma was informed that the remainder of the evaluation will be completed by another provider, this initial triage assessment does not replace that evaluation, and the importance of remaining in the ED until their evaluation is complete.  Workup initiated   Anselmo Pickler, [place] 05/14/22 0768

## 2022-05-14 NOTE — ED Notes (Signed)
Pt c/o becoming nauseated, dizzy, and having back pain, as well as increasing body pain.

## 2022-05-14 NOTE — ED Provider Notes (Signed)
[facility] EMERGENCY DEPARTMENT Provider Note   CSN: [id number] Arrival date & time: 05/14/22  0122     History  Chief Complaint  Patient presents with   Fall / Head Laceration     Gary Atkins is a 81 y.o. male.  65 yoM with a cc of a fall. Patient was up he thinks to use the bathroom and fell.  Landed on his bottom fell back and struck his head.  Complaining of pain to the low back, and head.  Feels a bit sore all over.          Home Medications Prior to Admission medications   Medication Sig Start Date End Date Taking? Authorizing Provider  aspirin 81 MG tablet Take 81 mg by mouth daily.    [provider]  atorvastatin (LIPITOR) 20 MG tablet TAKE 1 TABLET BY MOUTH ONCE DAILY 01/20/22   Biagio Borg, MD  augmented betamethasone dipropionate (DIPROLENE-AF) 0.05 % cream Apply topically 2 (two) times daily as needed. 11/18/21   [provider]  carvedilol (COREG) 6.25 MG tablet Take 1 tablet (6.25 mg total) by mouth 2 (two) times daily. 01/20/22   Biagio Borg, MD  CRANBERRY PO Take 1 capsule by mouth daily.    [provider]  Cyanocobalamin (B-12 PO) Take 2 tablets by mouth daily.    [provider]  divalproex (DEPAKOTE ER) 500 MG 24 hr tablet Take 1 tablet (500 mg total) by mouth 2 (two) times daily. 01/20/22   Biagio Borg, MD  latanoprost (XALATAN) 0.005 % ophthalmic solution SMARTSIG:In Eye(s) 04/07/20   [provider]  lisinopril (ZESTRIL) 5 MG tablet Take 1 tablet (5 mg total) by mouth daily. 01/20/22   Biagio Borg, MD  Multiple Vitamins-Minerals (PRESERVISION AREDS PO) Take 1 tablet by mouth daily.    [provider]  Multiple Vitamins-Minerals (ZINC PO) Take 1 tablet by mouth daily.    [provider]  mupirocin ointment (BACTROBAN) 2 % Apply topically 3 (three) times daily as needed. 08/02/21   [provider]  NON FORMULARY Antifungal toenail solution from [facility],  faxed on 01/14/2019    [provider]  RA KRILL OIL 500 MG CAPS Take 1 capsule by mouth daily.    [provider]  TURMERIC PO Take 1 tablet by mouth daily.    [provider]      Allergies    Demerol [meperidine]    Review of Systems   Review of Systems  Physical Exam Updated Vital Signs BP 106/71 (BP Location: Right Arm)   Pulse 81   Temp 97.9 F (36.6 C)   Resp 18   SpO2 99%  Physical Exam Vitals and nursing note reviewed.  Constitutional:      Appearance: He is well-developed.  HENT:     Head: Normocephalic.     Comments: Laceration and avulsion to the skin about the left occiput Eyes:     Pupils: Pupils are equal, round, and reactive to light.  Neck:     Vascular: No JVD.  Cardiovascular:     Rate and Rhythm: Normal rate and regular rhythm.     Heart sounds: No murmur heard.    No friction rub. No gallop.  Pulmonary:     Effort: No respiratory distress.     Breath sounds: No wheezing.  Abdominal:     General: There is no distension.     Tenderness: There is no abdominal tenderness. There  is no guarding or rebound.  Musculoskeletal:        General: Normal range of motion.     Cervical back: Normal range of motion and neck supple.     Comments: Patient tells me he has pain along the entirety of the midline spine, but without obvious reaction to palpation, no stepoff or deformities.  Palpated from head to toe without other noted areas of bony tenderness.   Skin:    Coloration: Skin is not pale.     Findings: No rash.  Neurological:     Mental Status: He is alert and oriented to person, place, and time.  Psychiatric:        Behavior: Behavior normal.     ED Results / Procedures / Treatments   Labs (all labs ordered are listed, but only abnormal results are displayed) Labs Reviewed - No data to display  EKG EKG Interpretation  Date/Time:  Sunday May 14 2022 01:37:36 EST Ventricular Rate:  87 PR Interval:  194 QRS  Duration: 82 QT Interval:  410 QTC Calculation: 493 R Axis:   -17 Text Interpretation: Normal sinus rhythm Low voltage QRS Possible Anterolateral infarct , age undetermined Abnormal ECG No significant change since last tracing Confirmed by Deno Etienne [phone]) on 05/14/2022 7:28:11 AM  Radiology CT Lumbar Spine Wo Contrast  Result Date: 05/14/2022 CLINICAL DATA:  Fall EXAM: CT LUMBAR SPINE WITHOUT CONTRAST TECHNIQUE: Multidetector CT imaging of the lumbar spine was performed without intravenous contrast administration. Multiplanar CT image reconstructions were also generated. RADIATION DOSE REDUCTION: This exam was performed according to the departmental dose-optimization program which includes automated exposure control, adjustment of the mA and/or kV according to patient size and/or use of iterative reconstruction technique. COMPARISON:  03/23/2018 CT abdomen pelvis FINDINGS: Segmentation: 5 lumbar type vertebral bodies. The last fully formed disc space is at L5-S1. Alignment: Mild levocurvature.  No new listhesis. Vertebrae: Unchanged appearance of compression deformities of T12, L1, L2, and L4. Redemonstrated anterior and posterior fusion of L3 and L4. Paraspinal and other soft tissues: Prior cholecystectomy. Aortic atherosclerosis. Diverticulosis without evidence of diverticulitis. Nonobstructing renal calculi. Disc levels: Mild to moderate spinal canal stenosis at L4-L5. Mild left neural foraminal narrowing at L4-L5 and L5-S1. IMPRESSION: 1. No acute fracture or traumatic listhesis. 2. Unchanged appearance of compression deformities of T12, L1, L2, and L4. 3. Mild to moderate spinal canal stenosis at L4-L5. Mild left neural foraminal narrowing at L4-L5 and L5-S1. 4. Aortic atherosclerosis. Aortic Atherosclerosis (ICD10-I70.0). Electronically Signed   By: Merilyn Baba M.D.   On: 05/14/2022 03:27   CT Head Wo Contrast  Result Date: 05/14/2022 CLINICAL DATA:  Head and neck trauma, fell backwards  with scalp laceration in left occipital region. Headache. EXAM: CT HEAD WITHOUT CONTRAST CT CERVICAL SPINE WITHOUT CONTRAST TECHNIQUE: Multidetector CT imaging of the head and cervical spine was performed following the standard protocol without intravenous contrast. Multiplanar CT image reconstructions of the cervical spine were also generated. RADIATION DOSE REDUCTION: This exam was performed according to the departmental dose-optimization program which includes automated exposure control, adjustment of the mA and/or kV according to patient size and/or use of iterative reconstruction technique. COMPARISON:  None Available. FINDINGS: CT HEAD FINDINGS Brain: No acute intracranial hemorrhage, midline shift or mass effect. No extra-axial fluid collection. Diffuse atrophy is noted. Periventricular white matter hypodensities are present bilaterally. No hydrocephalus. Vascular: No hyperdense vessel or unexpected calcification. Skull: No acute fracture. Sinuses/Orbits: There is partial opacification of the ethmoid air cells. Mucosal thickening  is noted in the frontal sinus on the left, sphenoid sinuses bilaterally and left maxillary sinus. A small air-fluid level is noted in the right maxillary sinus. No acute orbital abnormality. Other: Small scalp hematoma is present over the parietal bone on the left. CT CERVICAL SPINE FINDINGS Alignment: Normal. Skull base and vertebrae: No acute fracture. No primary bone lesion or focal pathologic process. Soft tissues and spinal canal: No prevertebral fluid or swelling. No visible canal hematoma. Disc levels: Multilevel intervertebral disc space narrowing, uncovertebral osteophyte formation, and facet arthropathy. Upper chest: No acute abnormality. Other: Carotid artery calcifications. IMPRESSION: 1. No acute intracranial process. 2. Atrophy with chronic microvascular ischemic changes. 3. Scalp hematoma over the parietal bone on the left. 4. Degenerative changes in the cervical spine  without evidence of acute fracture. Electronically Signed   By: Brett Fairy M.D.   On: 05/14/2022 03:23   CT Cervical Spine Wo Contrast  Result Date: 05/14/2022 CLINICAL DATA:  Head and neck trauma, fell backwards with scalp laceration in left occipital region. Headache. EXAM: CT HEAD WITHOUT CONTRAST CT CERVICAL SPINE WITHOUT CONTRAST TECHNIQUE: Multidetector CT imaging of the head and cervical spine was performed following the standard protocol without intravenous contrast. Multiplanar CT image reconstructions of the cervical spine were also generated. RADIATION DOSE REDUCTION: This exam was performed according to the departmental dose-optimization program which includes automated exposure control, adjustment of the mA and/or kV according to patient size and/or use of iterative reconstruction technique. COMPARISON:  None Available. FINDINGS: CT HEAD FINDINGS Brain: No acute intracranial hemorrhage, midline shift or mass effect. No extra-axial fluid collection. Diffuse atrophy is noted. Periventricular white matter hypodensities are present bilaterally. No hydrocephalus. Vascular: No hyperdense vessel or unexpected calcification. Skull: No acute fracture. Sinuses/Orbits: There is partial opacification of the ethmoid air cells. Mucosal thickening is noted in the frontal sinus on the left, sphenoid sinuses bilaterally and left maxillary sinus. A small air-fluid level is noted in the right maxillary sinus. No acute orbital abnormality. Other: Small scalp hematoma is present over the parietal bone on the left. CT CERVICAL SPINE FINDINGS Alignment: Normal. Skull base and vertebrae: No acute fracture. No primary bone lesion or focal pathologic process. Soft tissues and spinal canal: No prevertebral fluid or swelling. No visible canal hematoma. Disc levels: Multilevel intervertebral disc space narrowing, uncovertebral osteophyte formation, and facet arthropathy. Upper chest: No acute abnormality. Other: Carotid  artery calcifications. IMPRESSION: 1. No acute intracranial process. 2. Atrophy with chronic microvascular ischemic changes. 3. Scalp hematoma over the parietal bone on the left. 4. Degenerative changes in the cervical spine without evidence of acute fracture. Electronically Signed   By: Brett Fairy M.D.   On: 05/14/2022 03:23    Procedures .Marland KitchenLaceration Repair  Date/Time: 05/14/2022 8:10 AM  Performed by: Deno Etienne, DO Authorized by: Deno Etienne, DO   Consent:    Consent obtained:  Verbal   Consent given by:  Patient   Risks, benefits, and alternatives were discussed: yes     Risks discussed:  Infection, pain, poor cosmetic result and poor wound healing   Alternatives discussed:  No treatment Universal protocol:    Procedure explained and questions answered to patient or proxy's satisfaction: yes     Immediately prior to procedure, a time out was called: yes     Patient identity confirmed:  Verbally with patient Anesthesia:    Anesthesia method:  Local infiltration   Local anesthetic:  Lidocaine 2% WITH epi Laceration details:    Location:  Scalp  Scalp location:  Occipital   Length (cm):  2.8 Pre-procedure details:    Preparation:  Patient was prepped and draped in usual sterile fashion Exploration:    Limited defect created (wound extended): no     Hemostasis achieved with:  Epinephrine and direct pressure   Imaging obtained comment:  CT   Imaging outcome: foreign body not noted     Wound exploration: entire depth of wound visualized     Wound extent: no underlying fracture     Contaminated: no   Treatment:    Area cleansed with:  Saline   Amount of cleaning:  Standard   Irrigation solution:  Sterile saline   Irrigation volume:  20   Irrigation method:  Syringe   Visualized foreign bodies/material removed: no     Debridement:  None   Undermining:  None   Scar revision: no   Skin repair:    Repair method:  Staples   Number of staples:  4 Approximation:     Approximation:  Close Repair type:    Repair type:  Simple Post-procedure details:    Dressing:  Open (no dressing)   Procedure completion:  Tolerated well, no immediate complications     Medications Ordered in ED Medications  lidocaine-EPINEPHrine (XYLOCAINE W/EPI) 2 %-1:200000 (PF) injection 20 mL (has no administration in time range)  Tdap (BOOSTRIX) injection 0.5 mL (0.5 mLs Intramuscular Given 05/14/22 0140)    ED Course/ Medical Decision Making/ A&P                           Medical Decision Making Risk Prescription drug management.   81 yo M with a cc of a fall.  Non syncopal by history.  Family worried he lost consciousness and he has some amnesia to the event.  CT head, C spine and L spine negative for acute pathology.  Will repair wound at bedside.  PCP follow up.  8:11 AM:  I have discussed the diagnosis/risks/treatment options with the patient.  Evaluation and diagnostic testing in the emergency department does not suggest an emergent condition requiring admission or immediate intervention beyond what has been performed at this time.  They will follow up with PCP. We also discussed returning to the ED immediately if new or worsening sx occur. We discussed the sx which are most concerning (e.g., sudden worsening pain, fever, inability to tolerate by mouth) that necessitate immediate return. Medications administered to the patient during their visit and any new prescriptions provided to the patient are listed below.  Medications given during this visit Medications  lidocaine-EPINEPHrine (XYLOCAINE W/EPI) 2 %-1:200000 (PF) injection 20 mL (has no administration in time range)  Tdap (BOOSTRIX) injection 0.5 mL (0.5 mLs Intramuscular Given 05/14/22 0140)     The patient appears reasonably screen and/or stabilized for discharge and I doubt any other medical condition or other [facility] requiring further screening, evaluation, or treatment in the ED at this time prior to discharge.           Final Clinical Impression(s) / ED Diagnoses Final diagnoses:  Fall, initial encounter  Scalp laceration, initial encounter    Rx / DC Orders ED Discharge Orders     None         Deno Etienne, DO 05/14/22 [phone]

## 2022-05-14 NOTE — ED Triage Notes (Addendum)
Patient arrived with EMS from home fell backwards this evening , presents with scalp laceration approx. 2" at upper left occipital area , patient can not recall incident . Reports headache and low back/buttocks pain . Alert and oriented at arrival .

## 2022-05-14 NOTE — Discharge Instructions (Signed)
Return for redness drainage or if you get a fever.  Staples that were placed should be removed between day 7-10, this can be done here, at urgent care or at your family doctors office..  The area can get wet but not fully immersed underwater.  No scrubbing.  If you really want to clean it you can apply a half-and-half hydrogen peroxide solution with water on a Q-tip.  You can apply an ointment a couple times a day this could be as simple as Vaseline but could also be an antibiotic ointment if you wish.  Once it is healed please try to avoid prolonged sun exposure use sunscreen.  Gells that have silicone antigens have been shown to reduce scarring in some research.    Please return for worsening headache, confusion.  Talk with your family doctor about your fall and your aspirin use.

## 2022-05-14 NOTE — ED Notes (Signed)
Pts head wrapped. Verbalized understanding of discharge paperwork and follow-up care.

## 2022-05-15 ENCOUNTER — Telehealth: Payer: Self-pay | Admitting: Internal Medicine

## 2022-05-15 NOTE — Telephone Encounter (Signed)
Unable to leave a message for a call back regarding aspirin

## 2022-05-15 NOTE — Telephone Encounter (Signed)
As long as the stitches are holding up, ok to restart the asa now

## 2022-05-15 NOTE — Telephone Encounter (Signed)
Patient was seen in ER on late Saturday night for fall - he received staples in his scalp and ER doctor advised him that he should stop taking his baby aspirin for a few days but did not give specific instructions for when to start taking it again. Patient would like call back to find out when he should resume his aspirin. Call back number is [phone].

## 2022-05-15 NOTE — Telephone Encounter (Signed)
Patient was informed to stop aspirin for a few days after a visit to the ED. Patient would like to know when to restart taking the aspirin.

## 2022-05-16 ENCOUNTER — Ambulatory Visit: Payer: Medicare Other | Admitting: Physical Therapy

## 2022-05-18 NOTE — Telephone Encounter (Signed)
Spoke with patient and wife, he is doing well. I informed him that if the stitches are holding up that he may resume taking the aspirin.

## 2022-05-23 ENCOUNTER — Ambulatory Visit (INDEPENDENT_AMBULATORY_CARE_PROVIDER_SITE_OTHER): Payer: Medicare Other | Admitting: Internal Medicine

## 2022-05-23 VITALS — BP 112/72 | HR 61 | Temp 97.7°F | Ht 69.0 in | Wt 175.0 lb

## 2022-05-23 DIAGNOSIS — R55 Syncope and collapse: Secondary | ICD-10-CM | POA: Diagnosis not present

## 2022-05-23 DIAGNOSIS — S0101XD Laceration without foreign body of scalp, subsequent encounter: Secondary | ICD-10-CM

## 2022-05-23 DIAGNOSIS — R739 Hyperglycemia, unspecified: Secondary | ICD-10-CM

## 2022-05-23 DIAGNOSIS — Z23 Encounter for immunization: Secondary | ICD-10-CM

## 2022-05-23 DIAGNOSIS — R053 Chronic cough: Secondary | ICD-10-CM

## 2022-05-23 NOTE — Patient Instructions (Signed)
Your staples were removed today  Please continue all other medications as before, including the coreg  Please have the pharmacy call with any other refills you may need.  Please continue your efforts at being more active, low cholesterol diet, and weight control.  Please keep your appointments with your specialists as you may have planned  You will be contacted regarding the referral for: Cardiology, Echo, Carotid tests,and heart monitor test  Please make an Appointment to return in 4 months, or sooner if needed
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
## 2022-05-25 ENCOUNTER — Telehealth (HOSPITAL_COMMUNITY): Payer: Self-pay | Admitting: Internal Medicine

## 2022-05-25 ENCOUNTER — Other Ambulatory Visit (HOSPITAL_COMMUNITY): Payer: Medicare Other

## 2022-05-25 DIAGNOSIS — Z08 Encounter for follow-up examination after completed treatment for malignant neoplasm: Secondary | ICD-10-CM | POA: Diagnosis not present

## 2022-05-25 DIAGNOSIS — Z85828 Personal history of other malignant neoplasm of skin: Secondary | ICD-10-CM | POA: Diagnosis not present

## 2022-05-25 NOTE — Telephone Encounter (Signed)
Good Morning,  Patient has cancelled the echocardiogram that the MD ordered. Order will be removed from the echo WQ and if patient calls back to reschedule we will reinstate the order.    Thank you

## 2022-05-27 ENCOUNTER — Encounter: Payer: Self-pay | Admitting: Internal Medicine

## 2022-05-27 DIAGNOSIS — S0191XA Laceration without foreign body of unspecified part of head, initial encounter: Secondary | ICD-10-CM | POA: Insufficient documentation

## 2022-05-27 NOTE — Assessment & Plan Note (Signed)
Chronic mild persistent, declines further eval or change in tx today

## 2022-05-27 NOTE — Assessment & Plan Note (Signed)
Lab Results  Component Value Date   HGBA1C 6.0 01/20/2022   Stable, pt to continue current medical treatment  - diet, wt control, excercise

## 2022-05-27 NOTE — Assessment & Plan Note (Signed)
Intact, staples removed,  to f/u any worsening symptoms or concerns

## 2022-05-27 NOTE — Assessment & Plan Note (Addendum)
Etiology unclear, cannot r/o cardiaac though also had cough;  declines change of Ace to ARB for now, ECG nov 28 reviewed, no for echo, cardiac event monitor, carotid dopplers, and refer cardiology; also restart coreg he realized now he was not taking

## 2022-05-31 ENCOUNTER — Ambulatory Visit
Admission: RE | Admit: 2022-05-31 | Discharge: 2022-05-31 | Disposition: A | Discharge status: Home / Self Care | Payer: Medicare Other | Source: Ambulatory Visit | Attending: Internal Medicine | Admitting: Internal Medicine

## 2022-05-31 DIAGNOSIS — R55 Syncope and collapse: Secondary | ICD-10-CM | POA: Diagnosis not present

## 2022-05-31 DIAGNOSIS — I6523 Occlusion and stenosis of bilateral carotid arteries: Secondary | ICD-10-CM | POA: Diagnosis not present

## 2022-05-31 DIAGNOSIS — I771 Stricture of artery: Secondary | ICD-10-CM | POA: Diagnosis not present

## 2022-06-02 ENCOUNTER — Ambulatory Visit: Payer: Medicare Other | Attending: Internal Medicine

## 2022-06-02 DIAGNOSIS — R55 Syncope and collapse: Secondary | ICD-10-CM | POA: Diagnosis not present

## 2022-06-03 DIAGNOSIS — R55 Syncope and collapse: Secondary | ICD-10-CM | POA: Diagnosis not present

## 2022-06-13 ENCOUNTER — Ambulatory Visit (INDEPENDENT_AMBULATORY_CARE_PROVIDER_SITE_OTHER): Payer: Medicare Other

## 2022-06-13 ENCOUNTER — Telehealth: Payer: Self-pay | Admitting: Cardiology

## 2022-06-13 DIAGNOSIS — I517 Cardiomegaly: Secondary | ICD-10-CM

## 2022-06-13 DIAGNOSIS — I4891 Unspecified atrial fibrillation: Secondary | ICD-10-CM

## 2022-06-13 LAB — ECHOCARDIOGRAM COMPLETE
AR max vel: 2.03 cm2
AV Area VTI: 1.69 cm2
AV Area mean vel: 2.02 cm2
AV Mean grad: 2 mmHg
AV Peak grad: 3.3 mmHg
Ao pk vel: 0.91 m/s
Area-P 1/2: 2.81 cm2
S' Lateral: 2.55 cm

## 2022-06-13 NOTE — Telephone Encounter (Signed)
   Cardiac Monitor Alert  Date of alert:  06/13/2022   Patient Name: Gary Atkins  DOB: 07/04/1940  MRN: [id number]   [place] Cardiologist: Buford Dresser, MD  [facility] EP:  None    Monitor Information: Cardiac Event Monitor [[employer]]  Reason:  S/p ablation Ordering provider:  Buford Dresser   Alert Atrial Fibrillation/Flutter This is the 1st alert for this rhythm.  The patient has no hx of Atrial Fibrillation/Flutter.  The patient is not currently on anticoagulation.  Next Cardiology Appointment   Date:  06/27/2022  Provider: Dr. Harrell Gave  The patient was contacted today.  He is asymptomatic. Arrhythmia, symptoms and history reviewed with Dr Caryl Comes.  Plan:  Echocardiogram and appt with Dr. Caryl Comes this week    Other: Per Dr. Caryl Comes review of [facility] Scientific Findings pt scheduled for Echocardiogram at [facility] 06/14/2022.  Pt aware. Pt scheduled to see Dr. Caryl Comes Thursday 06/15/2022 as per Dr. Caryl Comes.  Pt aware.   Idamae Lusher, RN  06/13/2022 2:01 PM

## 2022-06-13 NOTE — Telephone Encounter (Signed)
[employer] calling to report critical EKG

## 2022-06-13 NOTE — Addendum Note (Signed)
Addended by: Georgana Curio D on: 06/13/2022 04:52 PM   Modules accepted: Orders

## 2022-06-14 ENCOUNTER — Ambulatory Visit: Payer: Medicare Other

## 2022-06-14 DIAGNOSIS — L570 Actinic keratosis: Secondary | ICD-10-CM | POA: Diagnosis not present

## 2022-06-14 DIAGNOSIS — X32XXXD Exposure to sunlight, subsequent encounter: Secondary | ICD-10-CM | POA: Diagnosis not present

## 2022-06-14 NOTE — Telephone Encounter (Signed)
Documentation correction to information below:  Dr Cathlean Cower was the ordering provider for the Cardiac Monitor  [place] RN CCM

## 2022-06-15 ENCOUNTER — Ambulatory Visit: Payer: Medicare Other | Attending: Internal Medicine | Admitting: Internal Medicine

## 2022-06-15 ENCOUNTER — Encounter: Payer: Self-pay | Admitting: Internal Medicine

## 2022-06-15 VITALS — BP 106/70 | HR 73 | Ht 69.0 in | Wt 174.8 lb

## 2022-06-15 DIAGNOSIS — I4891 Unspecified atrial fibrillation: Secondary | ICD-10-CM

## 2022-06-15 DIAGNOSIS — I480 Paroxysmal atrial fibrillation: Secondary | ICD-10-CM | POA: Insufficient documentation

## 2022-06-15 DIAGNOSIS — I5042 Chronic combined systolic (congestive) and diastolic (congestive) heart failure: Secondary | ICD-10-CM

## 2022-06-15 MED ORDER — LISINOPRIL 5 MG PO TABS: 2.5000 mg | ORAL_TABLET | Freq: Every evening | ORAL | 2 refills | Status: DC

## 2022-06-15 MED ORDER — CARVEDILOL 6.25 MG PO TABS: 3.1250 mg | ORAL_TABLET | Freq: Two times a day (BID) | ORAL | 5 refills | Status: DC
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
## 2022-06-15 NOTE — Patient Instructions (Addendum)
Medication Instructions:  Your physician has recommended you make the following change in your medication: Medication DOSE CHANGE-  Carvedilol 3.125 mg  ( 0.5 Tablet )  DECREASED You will: Take 0.5 tablets (3.125 mg total) by mouth 2 (two) times daily.,   Lisinopril  2.5 mg   ( 0.5 Tablet )  DECREASED  You will:  : Take 0.5 tablets (2.5 mg total) by mouth at bedtime.,   Lab Work: None ordered.  If you have labs (blood work) drawn today and your tests are completely normal, you will receive your results only by: [place] (if you have MyChart) OR A paper copy in the mail If you have any lab test that is abnormal or we need to change your treatment, we will call you to review the results.  Testing/Procedures: None ordered.  Follow-Up: At [facility], you and your health needs are our priority.  As part of our continuing mission to provide you with exceptional heart care, we have created designated Provider Care Teams.  These Care Teams include your primary Cardiologist (physician) and Advanced Practice Providers (APPs -  Physician Assistants and Nurse Practitioners) who all work together to provide you with the care you need, when you need it.  We recommend signing up for the patient portal called "MyChart".  Sign up information is provided on this After Visit Summary.  MyChart is used to connect with patients for Virtual Visits (Telemedicine).  Patients are able to view lab/test results, encounter notes, upcoming appointments, etc.  Non-urgent messages can be sent to your provider as well.   To learn more about what you can do with MyChart, go to [email or web address].    Your next appointment:    Please schedule 4 week follow up appointment with an EP APP, to discuss Implantable Loop Recorder, per Dr Virl Axe   The format for your next appointment:   In Person  Provider:   Dr. Virl Axe   Important Information About Sugar

## 2022-06-16 ENCOUNTER — Other Ambulatory Visit (HOSPITAL_COMMUNITY): Payer: Medicare Other

## 2022-06-22 ENCOUNTER — Telehealth: Payer: Self-pay

## 2022-06-22 NOTE — Telephone Encounter (Signed)
Message sent to scheduling per Dr. Caryl Comes to: Please schedule 4 week follow up appointment with an EP APP, to discuss Implantable Loop Recorder, per Dr Virl Axe

## 2022-06-27 ENCOUNTER — Ambulatory Visit (HOSPITAL_BASED_OUTPATIENT_CLINIC_OR_DEPARTMENT_OTHER): Payer: Medicare Other | Admitting: Cardiology

## 2022-06-27 ENCOUNTER — Encounter (HOSPITAL_BASED_OUTPATIENT_CLINIC_OR_DEPARTMENT_OTHER): Payer: Self-pay | Admitting: Cardiology

## 2022-06-27 ENCOUNTER — Telehealth (HOSPITAL_BASED_OUTPATIENT_CLINIC_OR_DEPARTMENT_OTHER): Payer: Self-pay

## 2022-06-27 VITALS — BP 102/72 | HR 69 | Ht 69.0 in | Wt 173.3 lb

## 2022-06-27 DIAGNOSIS — I4891 Unspecified atrial fibrillation: Secondary | ICD-10-CM

## 2022-06-27 DIAGNOSIS — I493 Ventricular premature depolarization: Secondary | ICD-10-CM

## 2022-06-27 DIAGNOSIS — Z87898 Personal history of other specified conditions: Secondary | ICD-10-CM | POA: Diagnosis not present

## 2022-06-27 DIAGNOSIS — I428 Other cardiomyopathies: Secondary | ICD-10-CM | POA: Diagnosis not present

## 2022-06-27 DIAGNOSIS — Z7189 Other specified counseling: Secondary | ICD-10-CM

## 2022-06-27 DIAGNOSIS — I2510 Atherosclerotic heart disease of native coronary artery without angina pectoris: Secondary | ICD-10-CM
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
## 2022-06-27 NOTE — Telephone Encounter (Signed)
[employer] monitor alert received for patient. Patient to be seen today in clinic with Dr. Harrell Gave at 11am. Will be addressed at clinic visit, faxed information given to Dr. Harrell Gave.

## 2022-06-27 NOTE — Patient Instructions (Addendum)
Medication Instructions:  Your Physician recommend you continue on your current medication as directed.    *If you need a refill on your cardiac medications before your next appointment, please call your pharmacy*  Testing/Procedures: Monitor is finished on 07/01/22; you can mail it back at that time.  Follow-Up: At [facility], you and your health needs are our priority.  As part of our continuing mission to provide you with exceptional heart care, we have created designated Provider Care Teams.  These Care Teams include your primary Cardiologist (physician) and Advanced Practice Providers (APPs -  Physician Assistants and Nurse Practitioners) who all work together to provide you with the care you need, when you need it.  We recommend signing up for the patient portal called "MyChart".  Sign up information is provided on this After Visit Summary.  MyChart is used to connect with patients for Virtual Visits (Telemedicine).  Patients are able to view lab/test results, encounter notes, upcoming appointments, etc.  Non-urgent messages can be sent to your provider as well.   To learn more about what you can do with MyChart, go to [email or web address].    Your next appointment:   Follow up as scheduled with Gary Atkins   &   3 months with Dr. Harrell Gave

## 2022-06-27 NOTE — Telephone Encounter (Signed)
Called patient as he has not arrived at this time. Patient is trying to get here but is delayed due to weather and traffic. Report to be reviewed upon his arrival.

## 2022-06-30 ENCOUNTER — Encounter (HOSPITAL_BASED_OUTPATIENT_CLINIC_OR_DEPARTMENT_OTHER): Payer: Self-pay | Admitting: Cardiology

## 2022-07-03 ENCOUNTER — Encounter (HOSPITAL_BASED_OUTPATIENT_CLINIC_OR_DEPARTMENT_OTHER): Payer: Self-pay | Admitting: *Deleted

## 2022-07-12 DIAGNOSIS — X32XXXD Exposure to sunlight, subsequent encounter: Secondary | ICD-10-CM | POA: Diagnosis not present

## 2022-07-12 DIAGNOSIS — L570 Actinic keratosis: Secondary | ICD-10-CM | POA: Diagnosis not present
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
## 2022-07-17 ENCOUNTER — Encounter: Payer: Self-pay | Admitting: Physician Assistant

## 2022-07-17 ENCOUNTER — Ambulatory Visit: Payer: Medicare Other | Attending: Physician Assistant | Admitting: Physician Assistant

## 2022-07-17 VITALS — BP 100/64 | HR 76 | Ht 69.0 in | Wt 174.0 lb

## 2022-07-17 DIAGNOSIS — R55 Syncope and collapse: Secondary | ICD-10-CM | POA: Diagnosis not present

## 2022-07-17 DIAGNOSIS — I480 Paroxysmal atrial fibrillation: Secondary | ICD-10-CM

## 2022-07-17 DIAGNOSIS — I471 Supraventricular tachycardia, unspecified: Secondary | ICD-10-CM

## 2022-07-17 DIAGNOSIS — I493 Ventricular premature depolarization: Secondary | ICD-10-CM | POA: Diagnosis not present

## 2022-07-17 NOTE — Patient Instructions (Signed)
Medication Instructions:    Your physician recommends that you continue on your current medications as directed. Please refer to the Current Medication list given to you today.    *If you need a refill on your cardiac medications before your next appointment, please call your pharmacy*   Lab Work: [place]    If you have labs (blood work) drawn today and your tests are completely normal, you will receive your results only by: [place] (if you have MyChart) OR A paper copy in the mail If you have any lab test that is abnormal or we need to change your treatment, we will call you to review the results.   Testing/Procedures: NONE ORDERED  TODAY      Follow-Up: At [facility], you and your health needs are our priority.  As part of our continuing mission to provide you with exceptional heart care, we have created designated Provider Care Teams.  These Care Teams include your primary Cardiologist (physician) and Advanced Practice Providers (APPs -  Physician Assistants and Nurse Practitioners) who all work together to provide you with the care you need, when you need it.  We recommend signing up for the patient portal called "MyChart".  Sign up information is provided on this After Visit Summary.  MyChart is used to connect with patients for Virtual Visits (Telemedicine).  Patients are able to view lab/test results, encounter notes, upcoming appointments, etc.  Non-urgent messages can be sent to your provider as well.   To learn more about what you can do with MyChart, go to [email or web address].    Your next appointment:   AS SCHEDULED  Provider:   Buford Dresser, MD     Other Instructions

## 2022-07-19 ENCOUNTER — Telehealth: Payer: Self-pay | Admitting: Internal Medicine

## 2022-07-19 NOTE — Telephone Encounter (Signed)
Pt is requesting call back to discuss when appt they were suppose to receive in regards to monitor implant. Requesting call back.   Patient would also like to discuss his low bp reading.   BP 100/64

## 2022-07-19 NOTE — Telephone Encounter (Signed)
Spoke with patients wife secondary to patient not being able to hear on phone No blood pressure taken since Monday when done at visit with [facility] PA  Denies any dizziness upon standing She was just calling to follow up on visit from Monday Please call wife back at [phone]   Will forward to [facility] for review

## 2022-07-20 NOTE — Telephone Encounter (Signed)
July 19, 2022 Gary Atkins  to Me     07/19/22  6:24 PM I spoke to Dr. Caryl Comes this evening. Please see my addended noted from Monday. Dr. Caryl Comes will review with another colleague and discuss management strategies and then reach out to the patient with his recommendations. For now, no changes  [name]  Advised patients wife, verbalized understanding  Will forward to RN working with Dr Caryl Comes as Juluis Rainier

## 2022-07-24 ENCOUNTER — Ambulatory Visit: Payer: Medicare Other | Admitting: Podiatry

## 2022-07-24 ENCOUNTER — Encounter: Payer: Self-pay | Admitting: Podiatry

## 2022-07-24 VITALS — BP 100/66

## 2022-07-24 DIAGNOSIS — M79674 Pain in right toe(s): Secondary | ICD-10-CM

## 2022-07-24 DIAGNOSIS — M79675 Pain in left toe(s): Secondary | ICD-10-CM | POA: Diagnosis not present

## 2022-07-24 DIAGNOSIS — B351 Tinea unguium: Secondary | ICD-10-CM

## 2022-07-24 NOTE — Progress Notes (Unsigned)
  Subjective:  Patient ID: Gary Atkins, male    DOB: 03-12-1941,  MRN: [id number]  Gary Atkins presents to clinic today for {jgcomplaint:23593}  Chief Complaint  Patient presents with   Nail Problem    [place] PCP VST-6 weeks ago   New problem(s): None. {jgcomplaint:23593}  PCP is Biagio Borg, MD.  Allergies  Allergen Reactions   Demerol [Meperidine] Other (See Comments)    seizure    Review of Systems: Negative except as noted in the HPI.  Objective: No changes noted in today's physical examination. There were no vitals filed for this visit. Gary Atkins is a pleasant 82 y.o. male {jgbodyhabitus:24098} AAO x 3.  Neurovascular Examination: CFT immediate b/l LE. Palpable DP/PT pulses b/l LE. Digital hair sparse b/l. Skin temperature gradient WNL b/l. No pain with calf compression b/l. No edema noted b/l. No cyanosis or clubbing noted b/l LE.  Protective sensation intact 5/5 intact bilaterally with 10g monofilament b/l. Vibratory sensation intact b/l.  Dermatological:  Pedal integument with normal turgor, texture and tone b/l LE. No open wounds b/l. No interdigital macerations b/l. Toenails 1-5 b/l elongated, thickened, discolored with subungual debris. +Tenderness with dorsal palpation of nailplates. Incurvated nailplate left great toe bilateral borders. Nail border hypertrophy absent. There is tenderness to palpation. Sign(s) of infection: no clinical signs of infection noted on examination today. No hyperkeratotic or porokeratotic lesions present.  Musculoskeletal:  Normal muscle strength 5/5 to all lower extremity muscle groups bilaterally. No pain, crepitus or joint limitation noted with ROM b/l LE. No gross bony pedal deformities b/l. Patient ambulates independently without assistive aids.  Assessment/Plan: 1. Pain due to onychomycosis of toenails of both feet     No orders of the defined types were placed in this encounter.    None {Jgplan:23602::"-Patient/POA to call should there be question/concern in the interim."}   Return in about 3 months (around 10/22/2022).  Marzetta Board, DPM

## 2022-07-25 ENCOUNTER — Ambulatory Visit (INDEPENDENT_AMBULATORY_CARE_PROVIDER_SITE_OTHER): Payer: Medicare Other | Admitting: Internal Medicine

## 2022-07-25 VITALS — BP 112/64 | HR 86 | Temp 97.4°F | Ht 69.0 in | Wt 176.0 lb

## 2022-07-25 DIAGNOSIS — H6121 Impacted cerumen, right ear: Secondary | ICD-10-CM | POA: Diagnosis not present

## 2022-07-25 DIAGNOSIS — Z0001 Encounter for general adult medical examination with abnormal findings: Secondary | ICD-10-CM | POA: Diagnosis not present

## 2022-07-25 DIAGNOSIS — E7800 Pure hypercholesterolemia, unspecified: Secondary | ICD-10-CM

## 2022-07-25 DIAGNOSIS — F32A Depression, unspecified: Secondary | ICD-10-CM | POA: Diagnosis not present

## 2022-07-25 DIAGNOSIS — E559 Vitamin D deficiency, unspecified: Secondary | ICD-10-CM | POA: Diagnosis not present

## 2022-07-25 DIAGNOSIS — R739 Hyperglycemia, unspecified: Secondary | ICD-10-CM

## 2022-07-25 DIAGNOSIS — H9193 Unspecified hearing loss, bilateral: Secondary | ICD-10-CM

## 2022-07-25 DIAGNOSIS — E538 Deficiency of other specified B group vitamins: Secondary | ICD-10-CM | POA: Diagnosis not present

## 2022-07-25 LAB — HEPATIC FUNCTION PANEL
ALT: 23 U/L (ref 0–53)
AST: 23 U/L (ref 0–37)
Albumin: 4.2 g/dL (ref 3.5–5.2)
Alkaline Phosphatase: 58 U/L (ref 39–117)
Bilirubin, Direct: 0.3 mg/dL (ref 0.0–0.3)
Total Bilirubin: 1 mg/dL (ref 0.2–1.2)
Total Protein: 7.4 g/dL (ref 6.0–8.3)

## 2022-07-25 LAB — CBC WITH DIFFERENTIAL/PLATELET
Basophils Absolute: 0 10*3/uL (ref 0.0–0.1)
Basophils Relative: 0.5 % (ref 0.0–3.0)
Eosinophils Absolute: 0.4 10*3/uL (ref 0.0–0.7)
Eosinophils Relative: 5.3 % — ABNORMAL HIGH (ref 0.0–5.0)
HCT: 47.2 % (ref 39.0–52.0)
Hemoglobin: 16.2 g/dL (ref 13.0–17.0)
Lymphocytes Relative: 35 % (ref 12.0–46.0)
Lymphs Abs: 2.3 10*3/uL (ref 0.7–4.0)
MCHC: 34.3 g/dL (ref 30.0–36.0)
MCV: 95.2 fl (ref 78.0–100.0)
Monocytes Absolute: 0.7 10*3/uL (ref 0.1–1.0)
Monocytes Relative: 10.3 % (ref 3.0–12.0)
Neutro Abs: 3.2 10*3/uL (ref 1.4–7.7)
Neutrophils Relative %: 48.9 % (ref 43.0–77.0)
Platelets: 142 10*3/uL — ABNORMAL LOW (ref 150.0–400.0)
RBC: 4.96 Mil/uL (ref 4.22–5.81)
RDW: 14 % (ref 11.5–15.5)
WBC: 6.6 10*3/uL (ref 4.0–10.5)

## 2022-07-25 LAB — BASIC METABOLIC PANEL
BUN: 21 mg/dL (ref 6–23)
CO2: 24 mEq/L (ref 19–32)
Calcium: 9.7 mg/dL (ref 8.4–10.5)
Chloride: 101 mEq/L (ref 96–112)
Creatinine, Ser: 0.96 mg/dL (ref 0.40–1.50)
GFR: 73.18 mL/min (ref >60.00)
Glucose, Bld: 90 mg/dL (ref 70–99)
Potassium: 4.7 mEq/L (ref 3.5–5.1)
Sodium: 137 mEq/L (ref 135–145)

## 2022-07-25 LAB — LIPID PANEL
Cholesterol: 124 mg/dL (ref 0–200)
HDL: 48.7 mg/dL (ref >39.00)
LDL Cholesterol: 60 mg/dL (ref 0–99)
NonHDL: 75.67
Total CHOL/HDL Ratio: 3
Triglycerides: 77 mg/dL (ref 0.0–149.0)
VLDL: 15.4 mg/dL (ref 0.0–40.0)

## 2022-07-25 LAB — URINALYSIS, ROUTINE W REFLEX MICROSCOPIC
Bilirubin Urine: NEGATIVE
Hgb urine dipstick: NEGATIVE
Leukocytes,Ua: NEGATIVE
Nitrite: NEGATIVE
RBC / HPF: NONE SEEN (ref 0–?)
Specific Gravity, Urine: 1.025 (ref 1.000–1.030)
Total Protein, Urine: NEGATIVE
Urine Glucose: NEGATIVE
Urobilinogen, UA: 1 (ref 0.0–1.0)
pH: 5.5 (ref 5.0–8.0)

## 2022-07-25 LAB — TSH: TSH: 2.99 u[IU]/mL (ref 0.35–5.50)

## 2022-07-25 LAB — VITAMIN D 25 HYDROXY (VIT D DEFICIENCY, FRACTURES): VITD: 53.81 ng/mL (ref 30.00–100.00)

## 2022-07-25 LAB — HEMOGLOBIN A1C: Hgb A1c MFr Bld: 5.8 % (ref 4.6–6.5)

## 2022-07-25 LAB — VITAMIN B12: Vitamin B-12: >1500 pg/mL — ABNORMAL HIGH (ref 211–911)
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
## 2022-07-25 NOTE — Patient Instructions (Signed)
Please have your Shingrix (shingles) shots done at your local pharmacy.  Your right ear was irrigated of wax   Please continue all other medications as before, and refills have been done if requested.  Please have the pharmacy call with any other refills you may need.  Please continue your efforts at being more active, low cholesterol diet, and weight control.  You are otherwise up to date with prevention measures today.  Please keep your appointments with your specialists as you may have planned  Please go to the LAB at the blood drawing area for the tests to be done  You will be contacted by phone if any changes need to be made immediately.  Otherwise, you will receive a letter about your results with an explanation, but please check with MyChart first.  Please remember to sign up for MyChart if you have not done so, as this will be important to you in the future with finding out test results, communicating by private email, and scheduling acute appointments online when needed.  Please make an Appointment to return in 6 months, or sooner if needed

## 2022-07-25 NOTE — Progress Notes (Signed)
PRE-PROCEDURE EXAM: Left TM cannot be visualized due to total occlusion/impaction of the ear canal.  PROCEDURE INDICATION: remove wax to visualize ear drum & relieve discomfort  CONSENT:  Verbal     PROCEDURE NOTE:     RIGHT EAR:  I used warm water irrigation under direct visualization with the otoscope to free the wax bolus from the ear canal.    POST- PROCEDURE EXAM: TMs successfully visualized and found to have no erythema     The patient tolerated the procedure well.

## 2022-07-29 ENCOUNTER — Encounter: Payer: Self-pay | Admitting: Internal Medicine

## 2022-07-29 DIAGNOSIS — H6121 Impacted cerumen, right ear: Secondary | ICD-10-CM | POA: Insufficient documentation

## 2022-07-29 NOTE — Assessment & Plan Note (Signed)
Lab Results  Component Value Date   HGBA1C 5.8 07/25/2022   Stable, pt to continue current medical treatment  - diet, wt control

## 2022-07-29 NOTE — Assessment & Plan Note (Signed)
Lab Results  Component Value Date   LDLCALC 60 07/25/2022   Stable, pt to continue current statin lipitor 20 qd

## 2022-07-29 NOTE — Assessment & Plan Note (Signed)
Chronic with mild worsening, pt will call for follow up audiology, may need new hearing aids

## 2022-07-29 NOTE — Assessment & Plan Note (Signed)
Stable overall, declines need for further med tx or referral

## 2022-07-29 NOTE — Assessment & Plan Note (Signed)
Age and sex appropriate education and counseling updated with regular exercise and diet Referrals for preventative services - none needed Immunizations addressed - for shingrix at pharmacy Smoking counseling  - none needed Evidence for depression or other mood disorder - stable chronic depression Most recent labs reviewed. I have personally reviewed and have noted: 1) the patient's medical and social history 2) The patient's current medications and supplements 3) The patient's height, weight, and BMI have been recorded in the chart

## 2022-08-02 ENCOUNTER — Ambulatory Visit: Payer: Medicare Other | Admitting: Neurology

## 2022-08-02 ENCOUNTER — Encounter: Payer: Self-pay | Admitting: Neurology

## 2022-08-02 VITALS — BP 98/66 | HR 71 | Ht 69.0 in | Wt 178.0 lb

## 2022-08-02 DIAGNOSIS — R569 Unspecified convulsions: Secondary | ICD-10-CM

## 2022-08-02 DIAGNOSIS — G40909 Epilepsy, unspecified, not intractable, without status epilepticus: Secondary | ICD-10-CM | POA: Diagnosis not present

## 2022-08-02 DIAGNOSIS — M545 Low back pain, unspecified: Secondary | ICD-10-CM

## 2022-08-02 DIAGNOSIS — R413 Other amnesia: Secondary | ICD-10-CM

## 2022-08-02 DIAGNOSIS — G8929 Other chronic pain: Secondary | ICD-10-CM | POA: Diagnosis not present
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
## 2022-08-03 LAB — VALPROIC ACID LEVEL: Valproic Acid Lvl: 81 ug/mL (ref 50–100)
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
## 2022-08-14 ENCOUNTER — Telehealth: Payer: Self-pay | Admitting: Internal Medicine

## 2022-08-14 NOTE — Telephone Encounter (Signed)
Patient states he is having issues with his esophagus and would like a referral to an endocrinologist. States he will be out of town March 8-15.
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
## 2022-09-27 ENCOUNTER — Encounter (HOSPITAL_BASED_OUTPATIENT_CLINIC_OR_DEPARTMENT_OTHER): Payer: Self-pay | Admitting: Cardiology

## 2022-09-27 ENCOUNTER — Ambulatory Visit (HOSPITAL_BASED_OUTPATIENT_CLINIC_OR_DEPARTMENT_OTHER): Payer: Medicare Other | Admitting: Cardiology

## 2022-09-27 VITALS — BP 120/70 | HR 83 | Ht 69.0 in | Wt 175.0 lb

## 2022-09-27 DIAGNOSIS — I471 Supraventricular tachycardia, unspecified: Secondary | ICD-10-CM | POA: Diagnosis not present

## 2022-09-27 DIAGNOSIS — I428 Other cardiomyopathies: Secondary | ICD-10-CM | POA: Diagnosis not present

## 2022-09-27 DIAGNOSIS — I493 Ventricular premature depolarization: Secondary | ICD-10-CM

## 2022-09-27 DIAGNOSIS — Z87898 Personal history of other specified conditions: Secondary | ICD-10-CM | POA: Diagnosis not present

## 2022-09-27 NOTE — Patient Instructions (Signed)
Medication Instructions:  The current medical regimen is effective;  continue present plan and medications.  *If you need a refill on your cardiac medications before your next appointment, please call your pharmacy*   Lab Work: N/A   Testing/Procedures: N/A   Follow-Up: At [facility], you and your health needs are our priority.  As part of our continuing mission to provide you with exceptional heart care, we have created designated Provider Care Teams.  These Care Teams include your primary Cardiologist (physician) and Advanced Practice Providers (APPs -  Physician Assistants and Nurse Practitioners) who all work together to provide you with the care you need, when you need it.  We recommend signing up for the patient portal called "MyChart".  Sign up information is provided on this After Visit Summary.  MyChart is used to connect with patients for Virtual Visits (Telemedicine).  Patients are able to view lab/test results, encounter notes, upcoming appointments, etc.  Non-urgent messages can be sent to your provider as well.   To learn more about what you can do with MyChart, go to [email or web address].    Your next appointment:   3 month(s)  Provider:   Jodelle Red, MD    Other Instructions N/A

## 2022-10-04 DIAGNOSIS — R110 Nausea: Secondary | ICD-10-CM | POA: Diagnosis not present

## 2022-10-04 DIAGNOSIS — R1030 Lower abdominal pain, unspecified: Secondary | ICD-10-CM | POA: Diagnosis not present

## 2022-10-04 DIAGNOSIS — I2510 Atherosclerotic heart disease of native coronary artery without angina pectoris: Secondary | ICD-10-CM | POA: Diagnosis not present

## 2022-10-04 DIAGNOSIS — I444 Left anterior fascicular block: Secondary | ICD-10-CM | POA: Diagnosis not present

## 2022-10-04 DIAGNOSIS — R1110 Vomiting, unspecified: Secondary | ICD-10-CM | POA: Diagnosis not present

## 2022-10-04 DIAGNOSIS — I440 Atrioventricular block, first degree: Secondary | ICD-10-CM | POA: Diagnosis not present

## 2022-10-04 DIAGNOSIS — R509 Fever, unspecified: Secondary | ICD-10-CM | POA: Diagnosis not present

## 2022-10-04 DIAGNOSIS — R059 Cough, unspecified: Secondary | ICD-10-CM | POA: Diagnosis not present

## 2022-10-04 DIAGNOSIS — I498 Other specified cardiac arrhythmias: Secondary | ICD-10-CM | POA: Diagnosis not present

## 2022-10-04 DIAGNOSIS — R404 Transient alteration of awareness: Secondary | ICD-10-CM | POA: Diagnosis not present

## 2022-10-04 DIAGNOSIS — I10 Essential (primary) hypertension: Secondary | ICD-10-CM | POA: Diagnosis not present

## 2022-10-04 DIAGNOSIS — R0602 Shortness of breath: Secondary | ICD-10-CM | POA: Diagnosis not present

## 2022-10-04 DIAGNOSIS — R197 Diarrhea, unspecified: Secondary | ICD-10-CM | POA: Diagnosis not present

## 2022-10-04 DIAGNOSIS — J40 Bronchitis, not specified as acute or chronic: Secondary | ICD-10-CM | POA: Diagnosis not present

## 2022-10-04 DIAGNOSIS — Z743 Need for continuous supervision: Secondary | ICD-10-CM | POA: Diagnosis not present

## 2022-10-04 DIAGNOSIS — Z79899 Other long term (current) drug therapy: Secondary | ICD-10-CM | POA: Diagnosis not present

## 2022-10-04 DIAGNOSIS — R112 Nausea with vomiting, unspecified: Secondary | ICD-10-CM | POA: Diagnosis not present

## 2022-10-04 DIAGNOSIS — E785 Hyperlipidemia, unspecified: Secondary | ICD-10-CM | POA: Diagnosis not present

## 2022-10-05 DIAGNOSIS — R059 Cough, unspecified: Secondary | ICD-10-CM | POA: Diagnosis not present

## 2022-10-05 DIAGNOSIS — R112 Nausea with vomiting, unspecified: Secondary | ICD-10-CM | POA: Diagnosis not present

## 2022-10-05 DIAGNOSIS — R1030 Lower abdominal pain, unspecified: Secondary | ICD-10-CM | POA: Diagnosis not present

## 2022-10-05 DIAGNOSIS — R0602 Shortness of breath: Secondary | ICD-10-CM | POA: Diagnosis not present

## 2022-10-12 ENCOUNTER — Encounter (HOSPITAL_BASED_OUTPATIENT_CLINIC_OR_DEPARTMENT_OTHER): Payer: Self-pay | Admitting: Cardiology

## 2022-10-12 ENCOUNTER — Encounter: Payer: Self-pay | Admitting: Internal Medicine

## 2022-10-12 ENCOUNTER — Ambulatory Visit: Payer: Medicare Other | Attending: Cardiovascular Disease | Admitting: Internal Medicine

## 2022-10-12 DIAGNOSIS — R55 Syncope and collapse: Secondary | ICD-10-CM | POA: Diagnosis not present

## 2022-10-12 NOTE — Patient Instructions (Signed)
Medication Instructions:  Your physician recommends that you continue on your current medications as directed. Please refer to the Current Medication list given to you today.  *If you need a refill on your cardiac medications before your next appointment, please call your pharmacy*   Lab Work: None ordered.  If you have labs (blood work) drawn today and your tests are completely normal, you will receive your results only by: MyChart Message (if you have MyChart) OR A paper copy in the mail If you have any lab test that is abnormal or we need to change your treatment, we will call you to review the results.   Testing/Procedures: Dr Graciela Husbands has recommended you have an Internal Loop Recorder Implanted   Follow-Up: At [facility], you and your health needs are our priority.  As part of our continuing mission to provide you with exceptional heart care, we have created designated Provider Care Teams.  These Care Teams include your primary Cardiologist (physician) and Advanced Practice Providers (APPs -  Physician Assistants and Nurse Practitioners) who all work together to provide you with the care you need, when you need it.  We recommend signing up for the patient portal called "MyChart".  Sign up information is provided on this After Visit Summary.  MyChart is used to connect with patients for Virtual Visits (Telemedicine).  Patients are able to view lab/test results, encounter notes, upcoming appointments, etc.  Non-urgent messages can be sent to your provider as well.   To learn more about what you can do with MyChart, go to [email or web address].    Your next appointment:   To be scheduled - Dr Odessa Fleming scheduler will call you.
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
## 2022-10-17 ENCOUNTER — Ambulatory Visit (INDEPENDENT_AMBULATORY_CARE_PROVIDER_SITE_OTHER): Payer: Medicare Other | Admitting: Internal Medicine

## 2022-10-17 ENCOUNTER — Encounter: Payer: Self-pay | Admitting: Internal Medicine

## 2022-10-17 ENCOUNTER — Ambulatory Visit (INDEPENDENT_AMBULATORY_CARE_PROVIDER_SITE_OTHER): Payer: Medicare Other

## 2022-10-17 VITALS — BP 102/60 | HR 78 | Temp 98.0°F | Ht 69.0 in | Wt 167.0 lb

## 2022-10-17 DIAGNOSIS — R059 Cough, unspecified: Secondary | ICD-10-CM | POA: Diagnosis not present

## 2022-10-17 DIAGNOSIS — R052 Subacute cough: Secondary | ICD-10-CM

## 2022-10-17 DIAGNOSIS — R079 Chest pain, unspecified: Secondary | ICD-10-CM | POA: Diagnosis not present

## 2022-10-17 DIAGNOSIS — R0789 Other chest pain: Secondary | ICD-10-CM | POA: Diagnosis not present

## 2022-10-17 DIAGNOSIS — L858 Other specified epidermal thickening: Secondary | ICD-10-CM | POA: Diagnosis not present

## 2022-10-17 MED ORDER — HYDROCODONE-ACETAMINOPHEN 5-325 MG PO TABS
1.0000 | ORAL_TABLET | Freq: Four times a day (QID) | ORAL | 0 refills | Status: DC | PRN
Start: 2022-10-17 — End: 2022-10-17

## 2022-10-17 MED ORDER — HYDROCODONE-ACETAMINOPHEN 5-325 MG PO TABS
1.0000 | ORAL_TABLET | Freq: Four times a day (QID) | ORAL | 0 refills | Status: AC | PRN
Start: 2022-10-17 — End: 2022-10-22

## 2022-10-17 NOTE — Progress Notes (Signed)
Subjective:  Patient ID: Gary Atkins, male    DOB: 1940/10/05  Age: 82 y.o. MRN: [id number]  CC: Cough   HPI Edem A Garlick presents for f/up ----  He was at [facility] a few weeks ago and developed a cough.  He was seen there and diagnosed with bronchitis.  He tells me his chest x-ray was normal.  He was prescribed Zithromax.  He feels better but now complains of discomfort in his right lower back just distal to the shoulder blade.  The cough is better and nonproductive.  He complains of chronic unchanged shortness of breath, weakness, dizziness, and lightheadedness.  Outpatient Medications Prior to Visit  Medication Sig Dispense Refill   aspirin 81 MG tablet Take 81 mg by mouth daily.     atorvastatin (LIPITOR) 20 MG tablet TAKE 1 TABLET BY MOUTH ONCE DAILY 90 tablet 3   augmented betamethasone dipropionate (DIPROLENE-AF) 0.05 % cream Apply topically 2 (two) times daily as needed.     carvedilol (COREG) 6.25 MG tablet Take 0.5 tablets (3.125 mg total) by mouth 2 (two) times daily. 60 tablet 5   CRANBERRY PO Take 1 capsule by mouth daily.     Cyanocobalamin (B-12 PO) Take 2 tablets by mouth daily.     divalproex (DEPAKOTE ER) 500 MG 24 hr tablet Take 1 tablet (500 mg total) by mouth 2 (two) times daily. 180 tablet 3   latanoprost (XALATAN) 0.005 % ophthalmic solution SMARTSIG:In Eye(s)     lisinopril (ZESTRIL) 5 MG tablet Take 0.5 tablets (2.5 mg total) by mouth at bedtime. 60 tablet 2   Multiple Vitamins-Minerals (PRESERVISION AREDS PO) Take 1 tablet by mouth daily.     Multiple Vitamins-Minerals (ZINC PO) Take 1 tablet by mouth daily.     mupirocin ointment (BACTROBAN) 2 % Apply topically 3 (three) times daily as needed.     NON FORMULARY Antifungal toenail solution from [place], faxed on 01/14/2019     RA KRILL OIL 500 MG CAPS Take 1 capsule by mouth daily.     TURMERIC PO Take 1 tablet by mouth daily.     No facility-administered medications prior to visit.     ROS Review of Systems  Constitutional: Negative.  Negative for diaphoresis and fatigue.  HENT: Negative.  Negative for sore throat and trouble swallowing.   Eyes: Negative.   Respiratory:  Positive for cough and shortness of breath. Negative for chest tightness.   Cardiovascular:  Positive for chest pain. Negative for palpitations and leg swelling.  Gastrointestinal:  Negative for abdominal pain, constipation, diarrhea, nausea and vomiting.  Endocrine: Negative.   Genitourinary: Negative.  Negative for difficulty urinating.  Musculoskeletal:  Positive for back pain.  Skin:  Negative for color change, pallor and rash.  Neurological:  Positive for dizziness, weakness and light-headedness.  Psychiatric/Behavioral: Negative.      Objective:  BP 102/60 (BP Location: Left Arm, Patient Position: Sitting, Cuff Size: Large)   Pulse 78   Temp 98 F (36.7 C) (Oral)   Ht 5\' 9"  (1.753 m)   Wt 167 lb (75.8 kg)   SpO2 99%   BMI 24.66 kg/m   BP Readings from Last 3 Encounters:  10/17/22 102/60  09/27/22 120/70  08/02/22 98/66    Wt Readings from Last 3 Encounters:  10/17/22 167 lb (75.8 kg)  09/27/22 175 lb (79.4 kg)  08/02/22 178 lb (80.7 kg)    Physical Exam Vitals reviewed.  Constitutional:      General:  He is not in acute distress.    Appearance: He is not ill-appearing, toxic-appearing or diaphoretic.  Eyes:     General: No scleral icterus.    Conjunctiva/sclera: Conjunctivae normal.  Cardiovascular:     Rate and Rhythm: Normal rate and regular rhythm.     Heart sounds: No murmur heard. Pulmonary:     Effort: Pulmonary effort is normal.     Breath sounds: No stridor. No wheezing, rhonchi or rales.  Abdominal:     General: Abdomen is flat.     Palpations: There is no mass.     Tenderness: There is no abdominal tenderness. There is no guarding or rebound.     Hernia: No hernia is present.  Musculoskeletal:        General: Normal range of motion.       Arms:      Cervical back: Neck supple.     Right lower leg: No edema.     Left lower leg: No edema.  Lymphadenopathy:     Cervical: No cervical adenopathy.  Skin:    General: Skin is warm and dry.     Coloration: Skin is not pale.     Findings: No rash.  Neurological:     General: No focal deficit present.     Mental Status: He is alert. Mental status is at baseline.  Psychiatric:        Mood and Affect: Mood normal.        Behavior: Behavior normal.     Lab Results  Component Value Date   WBC 6.6 07/25/2022   HGB 16.2 07/25/2022   HCT 47.2 07/25/2022   PLT 142.0 (L) 07/25/2022   GLUCOSE 90 07/25/2022   CHOL 124 07/25/2022   TRIG 77.0 07/25/2022   HDL 48.70 07/25/2022   LDLCALC 60 07/25/2022   ALT 23 07/25/2022   AST 23 07/25/2022   NA 137 07/25/2022   K 4.7 07/25/2022   CL 101 07/25/2022   CREATININE 0.96 07/25/2022   BUN 21 07/25/2022   CO2 24 07/25/2022   TSH 2.99 07/25/2022   PSA 0.71 01/09/2019   INR 1.04 03/21/2010   HGBA1C 5.8 07/25/2022    US Carotid Bilateral  Result Date: 06/01/2022 CLINICAL DATA:  82 year old male with syncope and fall EXAM: BILATERAL CAROTID DUPLEX ULTRASOUND TECHNIQUE: Wallace Cullens scale imaging, color Doppler and duplex ultrasound were performed of bilateral carotid and vertebral arteries in the neck. COMPARISON:  None Available. FINDINGS: Criteria: Quantification of carotid stenosis is based on velocity parameters that correlate the residual internal carotid diameter with NASCET-based stenosis levels, using the diameter of the distal internal carotid lumen as the denominator for stenosis measurement. The following velocity measurements were obtained: RIGHT ICA:  Systolic 39 cm/sec, Diastolic 16 cm/sec CCA:  92 cm/sec SYSTOLIC ICA/CCA RATIO:  0.4 ECA:  61 cm/sec LEFT ICA:  Systolic 38 cm/sec, Diastolic 15 cm/sec CCA:  58 cm/sec SYSTOLIC ICA/CCA RATIO:  0.7 ECA:  54 cm/sec Right Brachial SBP: 98 Left Brachial SBP: 114 RIGHT CAROTID ARTERY: No significant  calcifications of the right common carotid artery. Intermediate waveform maintained. Moderate heterogeneous and partially calcified plaque at the right carotid bifurcation. No significant lumen shadowing. Low resistance waveform of the right ICA. Tortuosity RIGHT VERTEBRAL ARTERY: Antegrade flow with low resistance waveform. LEFT CAROTID ARTERY: No significant calcifications of the left common carotid artery. Intermediate waveform maintained. Moderate heterogeneous and partially calcified plaque at the left carotid bifurcation. No significant lumen shadowing. Low resistance waveform of the left  ICA. Tortuosity LEFT VERTEBRAL ARTERY:  Antegrade flow with low resistance waveform. IMPRESSION: Color duplex indicates moderate heterogeneous and calcified plaque, with no hemodynamically significant stenosis by duplex criteria in the extracranial cerebrovascular circulation. Signed, Yvone Neu. Miachel Roux, RPVI [facility] Electronically Signed   By: Gilmer Mor D.O.   On: 06/01/2022 09:58   DG Chest 2 View  Result Date: 10/17/2022 CLINICAL DATA:  Cough, chest pain. EXAM: CHEST - 2 VIEW COMPARISON:  January 20, 2022. FINDINGS: The heart size and mediastinal contours are within normal limits. Both lungs are clear. The visualized skeletal structures are unremarkable. IMPRESSION: No active cardiopulmonary disease. Electronically Signed   By: Lupita Raider M.D.   On: 10/17/2022 14:36     Assessment & Plan:   Subacute cough- Chest x-ray is negative for mass, infiltrate, or pneumothorax.  Will treat for musculoskeletal pain. -     DG Chest 2 View; Future  Right-sided chest wall pain -     DG Chest 2 View; Future -     HYDROcodone-Acetaminophen; Take 1 tablet by mouth every 6 (six) hours as needed for up to 5 days for moderate pain.  Dispense: 20 tablet; Refill: 0  Chest pain, musculoskeletal -     HYDROcodone-Acetaminophen; Take 1 tablet by mouth  every 6 (six) hours as needed for up to 5 days for moderate pain.  Dispense: 20 tablet; Refill: 0     Follow-up: Return if symptoms worsen or fail to improve.  Sanda Linger, MD

## 2022-10-17 NOTE — Patient Instructions (Signed)
Chest Wall Pain Chest wall pain is pain in or around the bones and muscles of your chest. Sometimes, an injury causes this pain. Excessive coughing or overuse of arm and chest muscles may also cause chest wall pain. Sometimes, the cause may not be known. This pain may take several weeks or longer to get better. Follow these instructions at home: Managing pain, stiffness, and swelling  If directed, put ice on the painful area: Put ice in a plastic bag. Place a towel between your skin and the bag. Leave the ice on for 20 minutes, 2-3 times per day. Activity Rest as told by your health care provider. Avoid activities that cause pain. These include any activities that use your chest muscles or your abdominal and side muscles to lift heavy items. Ask your health care provider what activities are safe for you. General instructions  Take over-the-counter and prescription medicines only as told by your health care provider. Do not use any products that contain nicotine or tobacco, such as cigarettes, e-cigarettes, and chewing tobacco. These can delay healing after injury. If you need help quitting, ask your health care provider. Keep all follow-up visits as told by your health care provider. This is important. Contact a health care provider if: You have a fever. Your chest pain becomes worse. You have new symptoms. Get help right away if: You have nausea or vomiting. You feel sweaty or light-headed. You have a cough with mucus from your lungs (sputum) or you cough up blood. You develop shortness of breath. These symptoms may represent a serious problem that is an emergency. Do not wait to see if the symptoms will go away. Get medical help right away. Call your local emergency services (911 in the [place] Do not drive yourself to the hospital. Summary Chest wall pain is pain in or around the bones and muscles of your chest. Depending on the cause, it may be treated with ice, rest, medicines, and  avoiding activities that cause pain. Contact a health care provider if you have a fever, worsening chest pain, or new symptoms. Get help right away if you feel light-headed or you develop shortness of breath. These symptoms may be an emergency. This information is not intended to replace advice given to you by your health care provider. Make sure you discuss any questions you have with your health care provider. Document Revised: 08/17/2020 Document Reviewed: 08/20/2020 Elsevier Patient Education  2023 Elsevier Inc.  

## 2022-10-26 ENCOUNTER — Ambulatory Visit: Payer: Medicare Other | Admitting: Internal Medicine

## 2022-11-07 ENCOUNTER — Ambulatory Visit: Payer: Medicare Other | Admitting: Podiatry

## 2022-12-19 ENCOUNTER — Encounter: Payer: Self-pay | Admitting: Internal Medicine

## 2022-12-19 ENCOUNTER — Ambulatory Visit: Payer: Medicare Other | Admitting: Internal Medicine

## 2022-12-19 ENCOUNTER — Ambulatory Visit: Payer: Medicare Other | Attending: Internal Medicine | Admitting: Internal Medicine

## 2022-12-19 VITALS — BP 98/62 | HR 99 | Ht 69.0 in | Wt 169.4 lb

## 2022-12-19 DIAGNOSIS — R55 Syncope and collapse: Secondary | ICD-10-CM

## 2022-12-19 DIAGNOSIS — I493 Ventricular premature depolarization: Secondary | ICD-10-CM

## 2022-12-19 DIAGNOSIS — I5042 Chronic combined systolic (congestive) and diastolic (congestive) heart failure: Secondary | ICD-10-CM

## 2022-12-19 DIAGNOSIS — I440 Atrioventricular block, first degree: Secondary | ICD-10-CM | POA: Diagnosis not present

## 2022-12-19 DIAGNOSIS — I959 Hypotension, unspecified: Secondary | ICD-10-CM | POA: Diagnosis not present

## 2022-12-19 DIAGNOSIS — I4891 Unspecified atrial fibrillation: Secondary | ICD-10-CM | POA: Diagnosis not present

## 2022-12-19 MED ORDER — FUROSEMIDE 20 MG PO TABS: 20.0000 mg | ORAL_TABLET | Freq: Every day | ORAL | 0 refills | Status: DC | PRN
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
## 2022-12-19 NOTE — Patient Instructions (Addendum)
Medication Instructions:  Your physician has recommended you make the following change in your medication:   ** Stop Carvedilol  ** Begin Furosemide 20mg  - 1 tablet by mouth x 5 days then only as needed for swelling   Labwork: None ordered.  Testing/Procedures: None ordered.  Follow-Up:  12 months with Dr Graciela Husbands    Implantable Loop Recorder Placement, Care After This sheet gives you information about how to care for yourself after your procedure. Your health care provider may also give you more specific instructions. If you have problems or questions, contact your health care provider. What can I expect after the procedure? After the procedure, it is common to have: Soreness or discomfort near the incision. Some swelling or bruising near the incision.  Follow these instructions at home: Incision care  Monitor your cardiac device site for redness, swelling, and drainage. Call the device clinic at [phone] if you experience these symptoms or fever/chills.  Keep the large square bandage on your site for 4 days and then you may remove it yourself. Keep the steri-strips underneath in place. They will usually fall off on their own, or may be removed after 10 days.   You may shower tomorrow with bandage on.  Pat dry.  Avoid lotions, ointments, or perfumes over your incision until it is well-healed.  Please do not submerge in water until your site is completely healed.   Your device is MRI compatible.   Remote monitoring is used to monitor your cardiac device from home. This monitoring is scheduled every month by our office. It allows [place] to keep an eye on the function of your device to ensure it is working properly.  If your wound site starts to bleed apply pressure.    For help with the monitor please call [employer] Monitor Support Specialist directly at [phone].    If you have any questions/concerns please call the device clinic at [phone].  Activity  Return  to your normal activities.  General instructions Follow instructions from your health care provider about how to manage your implantable loop recorder and transmit the information. Learn how to activate a recording if this is necessary for your type of device. You may go through a metal detection gate, and you may let someone hold a metal detector over your chest. Show your ID card if needed. Do not have an MRI unless you check with your health care provider first. Take over-the-counter and prescription medicines only as told by your health care provider. Keep all follow-up visits as told by your health care provider. This is important. Contact a health care provider if: You have redness, swelling, or pain around your incision. You have a fever. You have pain that is not relieved by your pain medicine. You have triggered your device because of fainting (syncope) or because of a heartbeat that feels like it is racing, slow, fluttering, or skipping (palpitations). Get help right away if you have: Chest pain. Difficulty breathing. Summary After the procedure, it is common to have soreness or discomfort near the incision. Change your dressing as told by your health care provider. Follow instructions from your health care provider about how to manage your implantable loop recorder and transmit the information. Keep all follow-up visits as told by your health care provider. This is important. This information is not intended to replace advice given to you by your health care provider. Make sure you discuss any questions you have with your health care provider. Document Released: 05/17/2015 Document Revised:  07/21/2017 Document Reviewed: 07/21/2017 Elsevier Patient Education  2020 [employer].

## 2022-12-22 ENCOUNTER — Telehealth: Payer: Self-pay

## 2022-12-22 ENCOUNTER — Ambulatory Visit: Payer: Medicare Other | Admitting: Internal Medicine

## 2022-12-22 NOTE — Telephone Encounter (Signed)
Pt returning nurses phone call. Please advise ?

## 2022-12-22 NOTE — Telephone Encounter (Signed)
Following alert received from [employer] received for One new AF episode that was 16 minutes.  AF burden is 0.9% of the time.  The pause and symptom events were false and occurred during implant.  There were 6 tachy episodes detected that were 125 -182 bpm.  One episode was 3 hours and 44 minutes.    Attempted to contact patient. No answer, LMTCB.

## 2022-12-22 NOTE — Telephone Encounter (Signed)
Patient returned call. Denies any symptoms since having the device placed. Advised I will forward to Dr. Graciela Husbands for review.

## 2022-12-22 NOTE — Telephone Encounter (Signed)
There were 2 more tachy arrhythmias detected that were rates of 162-194 bpm, they were consecutive and lasted for a total of one hour and 45 minutes.

## 2022-12-25 ENCOUNTER — Ambulatory Visit: Payer: Medicare Other | Admitting: Internal Medicine

## 2022-12-25 NOTE — Telephone Encounter (Signed)
Repeat ILR alert for tachy x2, longest duration , HR's 167-171 Previous tachy events routed, pt. has been asymptomatic Route to triage for ongoing daily tachy alerts LA, CVRS  ILR IMPLANT FOR SYNCOPE See BELOW message as well. Total 8 tachy episodes since 12/19/22.  Longest  3 hours 44 mins.   LM FOR PATIENT TO CALL BACK AND ASSESS SYMPTOMS.

## 2022-12-27 NOTE — Telephone Encounter (Signed)
Called and spoke with patient  No symptoms  Will continue to follow

## 2023-01-01 ENCOUNTER — Telehealth: Payer: Self-pay

## 2023-01-01 NOTE — Telephone Encounter (Signed)
Following alert received from [employer] received for possible AF with duration of 71m 35sec.No OAC on file, only ASA. Did not forward to AF clinic d/t <24 hours. Routing to Dr. Graciela Husbands to advise if [facility] is warranted.

## 2023-01-02 ENCOUNTER — Telehealth: Payer: Self-pay

## 2023-01-02 NOTE — Telephone Encounter (Signed)
ILR alert for tachy, no new EGM's.  "Report cannot be generated at this time" Route to triage for awareness LA, CVRS  Pt asymptomatic. Not taking BB d/t hypoTN originating on 7/2 per Graciela Husbands MD. Will review w/ SK in office 7/17.

## 2023-01-08 ENCOUNTER — Telehealth: Payer: Self-pay

## 2023-01-08 NOTE — Telephone Encounter (Addendum)
Following alert received from [employer] received for  Four tachycardia episodes that was 8 hours and 11 minute at 194 bpm, that is much longer than previous. See trends for fast HRs.  One AF episode is 58 minutes long and this patient apparently is not on OAC.   Will call patient after 8 AM to assess.

## 2023-01-08 NOTE — Telephone Encounter (Signed)
Per Dr. Ollen Bowl follow up to discuss SVT ablation with Dr. Ladona Ridgel.  Outreach made to Gary Atkins.  Gary Atkins scheduled to see Dr. Ladona Ridgel 01/09/2023.

## 2023-01-09 ENCOUNTER — Ambulatory Visit: Payer: Medicare Other | Attending: Internal Medicine | Admitting: Internal Medicine

## 2023-01-09 DIAGNOSIS — I471 Supraventricular tachycardia, unspecified: Secondary | ICD-10-CM

## 2023-01-09 DIAGNOSIS — I4891 Unspecified atrial fibrillation: Secondary | ICD-10-CM | POA: Diagnosis not present
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
## 2023-01-09 NOTE — Patient Instructions (Addendum)
Medication Instructions:  Your physician recommends that you continue on your current medications as directed. Please refer to the Current Medication list given to you today.  *If you need a refill on your cardiac medications before your next appointment, please call your pharmacy*  Lab Work: You will get lab work today;  CBC and BMP  If you have labs (blood work) drawn today and your tests are completely normal, you will receive your results only by: MyChart Message (if you have MyChart) OR A paper copy in the mail If you have any lab test that is abnormal or we need to change your treatment, we will call you to review the results.  Testing/Procedures: Your physician has recommended that you have an ablation. Catheter ablation is a medical procedure used to treat some cardiac arrhythmias (irregular heartbeats). During catheter ablation, a long, thin, flexible tube is put into a blood vessel in your groin (upper thigh), or neck. This tube is called an ablation catheter. It is then guided to your heart through the blood vessel. Radio frequency waves destroy small areas of heart tissue where abnormal heartbeats may cause an arrhythmia to start. Please see the instruction sheet given to you today.  Follow-Up:  You will follow up with Dr. Ladona Ridgel 4 weeks after your ablation.  Cardiac Ablation Cardiac ablation is a procedure to destroy, or ablate, a small amount of heart tissue that is causing problems. The heart has many electrical connections. Sometimes, these connections are abnormal and can cause the heart to beat very fast or irregularly. Ablating the abnormal areas can improve the heart's rhythm or return it to normal. Ablation may be done for people who: Have irregular or rapid heartbeats (arrhythmias). Have Wolff-Parkinson-White syndrome. Have taken medicines for an arrhythmia that did not work or caused side effects. Have a high-risk heartbeat that may be life-threatening. Tell a health  care provider about: Any allergies you have. All medicines you are taking, including vitamins, herbs, eye drops, creams, and over-the-counter medicines. Any problems you or family members have had with anesthesia. Any bleeding problems you have. Any surgeries you have had. Any medical conditions you have. Whether you are pregnant or may be pregnant. What are the risks? Your health care provider will talk with you about risks. These may include: Infection. Bruising and bleeding. Stroke or blood clots. Damage to nearby structures or organs. Allergic reaction to medicines or dyes. Needing a pacemaker if the heart gets damaged. A pacemaker is a device that helps the heart beat normally. Failure of the procedure. A repeat procedure may be needed. What happens before the procedure? Medicines Ask your health care provider about: Changing or stopping your regular medicines. These include any heart rhythm medicines, diabetes medicines, or blood thinners you take. Taking medicines such as aspirin and ibuprofen. These medicines can thin your blood. Do not take them unless your health care provider tells you to. Taking over-the-counter medicines, vitamins, herbs, and supplements. General instructions Follow instructions from your health care provider about what you may eat and drink. If you will be going home right after the procedure, plan to have a responsible adult: Take you home from the hospital or clinic. You will not be allowed to drive. Care for you for the time you are told. Ask your health care provider what steps will be taken to prevent infection. What happens during the procedure?  An IV will be inserted into one of your veins. You may be given: A sedative. This helps you relax.  Anesthesia. This will: Numb certain areas of your body. An incision will be made in your neck or your groin. A needle will be inserted through the incision and into a large vein in your neck or  groin. The small, thin tube (catheter) will be inserted through the needle and moved to your heart. A type of X-ray (fluoroscopy) will be used to help guide the catheter and provide images of the heart on a monitor. Dye may be injected through the catheter to help your surgeon see the area of the heart that needs treatment. Electrical currents will be sent from the catheter to destroy heart tissue in certain areas. There are three types of energy that may be used to do this: Heat (radiofrequency energy). Laser energy. Extreme cold (cryoablation). When the tissue has been destroyed, the catheter will be removed. Pressure will be held on the insertion area to prevent bleeding. A bandage (dressing) will be placed over the insertion area. The procedure may vary among health care providers and hospitals. What happens after the procedure? Your blood pressure, heart rate and rhythm, breathing rate, and blood oxygen level will be monitored until you leave the hospital or clinic. Your insertion area will be checked for bleeding. You will need to lie still for a few hours. If your groin was used, you will need to keep your leg straight for a few hours after the catheter is removed. This information is not intended to replace advice given to you by your health care provider. Make sure you discuss any questions you have with your health care provider. Document Revised: 11/22/2021 Document Reviewed: 11/22/2021 Elsevier Patient Education  2024 [employer].

## 2023-01-10 LAB — CBC WITH DIFFERENTIAL/PLATELET
Basophils Absolute: 0.1 10*3/uL (ref 0.0–0.2)
Basos: 1 %
EOS (ABSOLUTE): 0.2 10*3/uL (ref 0.0–0.4)
Eos: 2 %
Hematocrit: 51.8 % — ABNORMAL HIGH (ref 37.5–51.0)
Hemoglobin: 17.4 g/dL (ref 13.0–17.7)
Immature Grans (Abs): 0.1 10*3/uL (ref 0.0–0.1)
Immature Granulocytes: 1 %
Lymphocytes Absolute: 2.7 10*3/uL (ref 0.7–3.1)
Lymphs: 33 %
MCH: 31.8 pg (ref 26.6–33.0)
MCHC: 33.6 g/dL (ref 31.5–35.7)
MCV: 95 fL (ref 79–97)
Monocytes Absolute: 0.9 10*3/uL (ref 0.1–0.9)
Monocytes: 12 %
Neutrophils Absolute: 4.2 10*3/uL (ref 1.4–7.0)
Neutrophils: 51 %
Platelets: 185 10*3/uL (ref 150–450)
RBC: 5.48 x10E6/uL (ref 4.14–5.80)
RDW: 13 % (ref 11.6–15.4)
WBC: 8.2 10*3/uL (ref 3.4–10.8)

## 2023-01-10 LAB — BASIC METABOLIC PANEL
BUN/Creatinine Ratio: 20 (ref 10–24)
BUN: 25 mg/dL (ref 8–27)
CO2: 24 mmol/L (ref 20–29)
Calcium: 10.2 mg/dL (ref 8.6–10.2)
Chloride: 100 mmol/L (ref 96–106)
Creatinine, Ser: 1.27 mg/dL (ref 0.76–1.27)
Glucose: 120 mg/dL — ABNORMAL HIGH (ref 70–99)
Potassium: 5.2 mmol/L (ref 3.5–5.2)
Sodium: 141 mmol/L (ref 134–144)
eGFR: 56 mL/min/{1.73_m2} — ABNORMAL LOW (ref >59)

## 2023-01-11 NOTE — Telephone Encounter (Signed)
G  thx for your help with this guy s

## 2023-01-14 NOTE — Pre-Procedure Instructions (Signed)
Instructed patient on the following items: Arrival time 0730 Nothing to eat or drink after midnight No meds AM of procedure Responsible person to drive you home and stay with you for 24 hrs     

## 2023-01-15 ENCOUNTER — Ambulatory Visit (HOSPITAL_BASED_OUTPATIENT_CLINIC_OR_DEPARTMENT_OTHER): Payer: Medicare Other | Admitting: Cardiology

## 2023-01-15 ENCOUNTER — Other Ambulatory Visit: Payer: Self-pay

## 2023-01-15 ENCOUNTER — Ambulatory Visit (HOSPITAL_COMMUNITY)
Admission: RE | Admit: 2023-01-15 | Discharge: 2023-01-15 | Disposition: A | Discharge status: Home / Self Care | Payer: Medicare Other | Attending: Internal Medicine | Admitting: Internal Medicine

## 2023-01-15 ENCOUNTER — Ambulatory Visit (HOSPITAL_COMMUNITY)
Admission: RE | Disposition: A | Discharge status: Home / Self Care | Payer: Self-pay | Source: Home / Self Care | Attending: Internal Medicine

## 2023-01-15 DIAGNOSIS — I959 Hypotension, unspecified: Secondary | ICD-10-CM | POA: Diagnosis not present

## 2023-01-15 DIAGNOSIS — I471 Supraventricular tachycardia, unspecified: Secondary | ICD-10-CM | POA: Insufficient documentation

## 2023-01-15 HISTORY — PX: SVT ABLATION: EP1225

## 2023-01-15 SURGERY — SVT ABLATION

## 2023-01-15 MED ORDER — ONDANSETRON HCL 4 MG/2ML IJ SOLN: 4.0000 mg | Freq: Four times a day (QID) | INTRAMUSCULAR | Status: DC | PRN

## 2023-01-15 MED ORDER — FENTANYL CITRATE (PF) 100 MCG/2ML IJ SOLN
INTRAMUSCULAR | Status: AC
  Filled 2023-01-15: qty 2

## 2023-01-15 MED ORDER — SODIUM CHLORIDE 0.9 % IV SOLN: 250.0000 mL | INTRAVENOUS | Status: DC | PRN

## 2023-01-15 MED ORDER — BUPIVACAINE HCL (PF) 0.25 % IJ SOLN
INTRAMUSCULAR | Status: AC
  Filled 2023-01-15: qty 60

## 2023-01-15 MED ORDER — HEPARIN (PORCINE) IN NACL 1000-0.9 UT/500ML-% IV SOLN
INTRAVENOUS | Status: DC | PRN
  Administered 2023-01-15: 500 mL

## 2023-01-15 MED ORDER — MIDAZOLAM HCL 5 MG/5ML IJ SOLN
INTRAMUSCULAR | Status: DC | PRN
  Administered 2023-01-15: 1 mg via INTRAVENOUS

## 2023-01-15 MED ORDER — SODIUM CHLORIDE 0.9% FLUSH: 3.0000 mL | Freq: Two times a day (BID) | INTRAVENOUS | Status: DC

## 2023-01-15 MED ORDER — MIDAZOLAM HCL 5 MG/5ML IJ SOLN
INTRAMUSCULAR | Status: AC
  Filled 2023-01-15: qty 5

## 2023-01-15 MED ORDER — SODIUM CHLORIDE 0.9% FLUSH: 3.0000 mL | INTRAVENOUS | Status: DC | PRN

## 2023-01-15 MED ORDER — ACETAMINOPHEN 325 MG PO TABS: 650.0000 mg | ORAL_TABLET | ORAL | Status: DC | PRN

## 2023-01-15 MED ORDER — BUPIVACAINE HCL (PF) 0.25 % IJ SOLN
INTRAMUSCULAR | Status: DC | PRN
  Administered 2023-01-15: 60 mL

## 2023-01-15 MED ORDER — SODIUM CHLORIDE 0.9 % IV SOLN: INTRAVENOUS | Status: DC

## 2023-01-15 SURGICAL SUPPLY — 12 items
BAG SNAP BAND KOVER 36X36 (MISCELLANEOUS) IMPLANT
CATH EZ STEER NAV 4MM F-J CUR (ABLATOR) IMPLANT
CATH JOSEPH QUAD ALLRED 6F REP (CATHETERS) IMPLANT
CATH WEB BI DIR CSDF CRV REPRO (CATHETERS) IMPLANT
MAT PREVALON FULL STRYKER (MISCELLANEOUS) IMPLANT
PACK EP LATEX FREE (CUSTOM PROCEDURE TRAY) ×1
PACK EP LF (CUSTOM PROCEDURE TRAY) ×1 IMPLANT
PAD DEFIB RADIO PHYSIO CONN (PAD) ×1 IMPLANT
PATCH CARTO3 (PAD) IMPLANT
SHEATH PINNACLE 6F 10CM (SHEATH) IMPLANT
SHEATH PINNACLE 7F 10CM (SHEATH) IMPLANT
SHEATH PINNACLE 8F 10CM (SHEATH) IMPLANT

## 2023-01-15 NOTE — Progress Notes (Signed)
  Asked to see pt for tachycardia and SOB  States it started this am after showering, and HR 192 on home pulse Ox.   EKG shows SVT at 178 bpm.   Discussed with Dr. Ladona Ridgel and as long as pt is tolerating would continue monitoring and proceed with ablation this am.   Baldwin Crown" Lanna Poche, PA-C  01/15/2023 9:03 AM

## 2023-01-15 NOTE — Interval H&P Note (Signed)
History and Physical Interval Note:  01/15/2023 9:56 AM  Gary Atkins  has presented today for surgery, with the diagnosis of svt.  The various methods of treatment have been discussed with the patient and family. After consideration of risks, benefits and other options for treatment, the patient has consented to  Procedure(s): SVT ABLATION (N/A) as a surgical intervention.  The patient's history has been reviewed, patient examined, no change in status, stable for surgery.  I have reviewed the patient's chart and labs.  Questions were answered to the patient's satisfaction.     Lewayne Bunting

## 2023-01-15 NOTE — Progress Notes (Signed)
Sheath removal  Venous sheaths removed 6,7,8. Manually pressure held. VVS stable clean dry intact  Bedrest starts at 1230.  Malva Limes RN

## 2023-01-15 NOTE — Discharge Instructions (Signed)

## 2023-01-15 NOTE — Progress Notes (Signed)
Patient was given discharge instructions. He verbalized understanding. 

## 2023-01-15 NOTE — Progress Notes (Signed)
Pt ambulated ~100 ft in hall with no signs of bleeding from right groin site

## 2023-01-16 ENCOUNTER — Encounter (HOSPITAL_COMMUNITY): Payer: Self-pay | Admitting: Internal Medicine

## 2023-01-16 MED FILL — Fentanyl Citrate Preservative Free (PF) Inj 100 MCG/2ML: INTRAMUSCULAR | Qty: 2 | Status: AC

## 2023-01-17 ENCOUNTER — Encounter (INDEPENDENT_AMBULATORY_CARE_PROVIDER_SITE_OTHER): Payer: Self-pay

## 2023-01-17 NOTE — Telephone Encounter (Signed)
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 Thx.

## 2023-01-23 ENCOUNTER — Ambulatory Visit (INDEPENDENT_AMBULATORY_CARE_PROVIDER_SITE_OTHER): Payer: Medicare Other

## 2023-01-23 DIAGNOSIS — S80812A Abrasion, left lower leg, initial encounter: Secondary | ICD-10-CM | POA: Diagnosis not present

## 2023-01-23 DIAGNOSIS — R55 Syncope and collapse: Secondary | ICD-10-CM | POA: Diagnosis not present

## 2023-01-29 ENCOUNTER — Encounter (HOSPITAL_BASED_OUTPATIENT_CLINIC_OR_DEPARTMENT_OTHER): Payer: Self-pay | Admitting: Cardiology

## 2023-01-29 ENCOUNTER — Ambulatory Visit (HOSPITAL_BASED_OUTPATIENT_CLINIC_OR_DEPARTMENT_OTHER): Payer: Medicare Other | Admitting: Cardiology

## 2023-01-29 ENCOUNTER — Telehealth: Payer: Self-pay

## 2023-01-29 VITALS — BP 110/78 | HR 86 | Ht 69.0 in | Wt 169.2 lb

## 2023-01-29 DIAGNOSIS — I2510 Atherosclerotic heart disease of native coronary artery without angina pectoris: Secondary | ICD-10-CM

## 2023-01-29 DIAGNOSIS — I428 Other cardiomyopathies: Secondary | ICD-10-CM

## 2023-01-29 DIAGNOSIS — Z87898 Personal history of other specified conditions: Secondary | ICD-10-CM

## 2023-01-29 DIAGNOSIS — I471 Supraventricular tachycardia, unspecified: Secondary | ICD-10-CM | POA: Diagnosis not present

## 2023-01-29 DIAGNOSIS — R600 Localized edema: Secondary | ICD-10-CM | POA: Diagnosis not present

## 2023-01-29 MED ORDER — FUROSEMIDE 40 MG PO TABS
40.0000 mg | ORAL_TABLET | Freq: Every day | ORAL | 3 refills | Status: DC
Start: 2023-01-29 — End: 2023-11-17

## 2023-01-29 NOTE — Progress Notes (Signed)
Cardiology Office Note:  .   Date:  01/29/2023  ID:  Gary Atkins, DOB Feb 12, 1941, MRN [id number] PCP: Corwin Levins, MD  [facility] Providers Cardiologist:  Jodelle Red, MD {  History of Present Illness: .   Gary Atkins is a 82 y.o. male with a hx of chronic systolic and diastolic heart failure, NICM, nonobstructive CAD, hx of PVCs and AVNRT s/p ablation who is seen for follow up today.     Cardiac history: Chronic systolic and diastolic heart failure, suspected NICM, diagnosed 2011. Workup unrevealing, but EF improved from 35% to 50-55% after PVC ablation. Most recent EF 45-50% in 07/2017. Nonobstructive CAD S/P ablation of PVCs and AVNRT 2011 (Dr. Johney Frame) and Dr. Ladona Ridgel (12/2022)  Today: Had repeat SVT ablation about 2 weeks ago. Has also had loop recorder implanted since last visit. Has had worsening LE edema, taking lasix 20 mg daily. Does not make a lot of urine on this, does go some. Wife reports he has been breathing through his mouth, feels fatigued all the time.   Heart rate has been much better post ablation, has not been over mid-80s post ablation. Weights have been stable in the 160s.  ROS: Denies chest pain, shortness of breath at rest or with normal exertion. No PND, orthopnea, or unexpected weight gain. No syncope or palpitations. ROS otherwise negative except as noted.   Studies Reviewed: Marland Kitchen    EKG:     not ordered today  Physical Exam:   VS:  BP 110/78 (BP Location: Left Arm, Patient Position: Sitting, Cuff Size: Normal)   Pulse 86   Ht 5\' 9"  (1.753 m)   Wt 169 lb 3.2 oz (76.7 kg)   SpO2 98%   BMI 24.99 kg/m    Wt Readings from Last 3 Encounters:  01/29/23 169 lb 3.2 oz (76.7 kg)  01/15/23 165 lb (74.8 kg)  12/19/22 169 lb 6.4 oz (76.8 kg)    GEN: Well nourished, well developed in no acute distress HEENT: Normal, moist mucous membranes NECK: No JVD CARDIAC: regular rhythm, normal S1 and S2, no rubs or gallops. No murmur. VASCULAR:  Radial and DP pulses 2+ bilaterally. No carotid bruits RESPIRATORY:  Clear to auscultation without rales, wheezing or rhonchi  ABDOMEN: Soft, non-tender, non-distended MUSCULOSKELETAL:  Ambulates independently SKIN: Warm and dry, bilateral pitting edema L>R NEUROLOGIC:  Alert and oriented x 3. No focal neuro deficits noted. PSYCHIATRIC:  Normal affect    ASSESSMENT AND PLAN: .   History of syncope pSVT PVCs -no recent events -now s/p repeat SVT ablation and loop recorder implantation  Bilateral LE edema -no rales, but breathing through mouth -did not improve but actually may have worsened on 20 mg lasix -increasing to 40 mg lasix today. They will return to daily weights. Call me with any worsening symptoms -check BMET at follow up. If not improved symptomatically, recheck echo   Nonischemic cardiomyopathy, prior EF 45-50%, now 60-65% -continue to feel SOB with significant exertion -on carvedilol and lisinopril -thought to be NICM 2/2 PVC burden -We discussed red flag warning signs that need immediate medical attention -previously declined SGLT2i   Nonobstructive CAD: no symptoms -continue aspirin, atorvastatin -no bleeding issues. Platelets remain borderline low. Monitor.  CV risk counseling and prevention -recommend heart healthy/Mediterranean diet, with whole grains, fruits, vegetable, fish, lean meats, nuts, and olive oil. Limit salt. -recommend moderate walking, 3-5 times/week for 30-50 minutes each session. Aim for at least 150 minutes.week. Goal should be pace of  3 miles/hours, or walking 1.5 miles in 30 minutes -recommend avoidance of tobacco products. Avoid excess alcohol.  Dispo: 2 weeks for urgent visit  Signed, Jodelle Red, MD   Jodelle Red, MD, PhD, [facility] [place]  [facility]    Heart & Vascular at [facility] [address] [place], [place] [address] [phone]

## 2023-01-29 NOTE — Patient Instructions (Signed)
Medication Instructions:  Increase Lasix to 40 mg daily *If you need a refill on your cardiac medications before your next appointment, please call your pharmacy*   Lab Work: None    Testing/Procedures: None   Follow-Up: At [facility], you and your health needs are our priority.  As part of our continuing mission to provide you with exceptional heart care, we have created designated Provider Care Teams.  These Care Teams include your primary Cardiologist (physician) and Advanced Practice Providers (APPs -  Physician Assistants and Nurse Practitioners) who all work together to provide you with the care you need, when you need it.  We recommend signing up for the patient portal called "MyChart".  Sign up information is provided on this After Visit Summary.  MyChart is used to connect with patients for Virtual Visits (Telemedicine).  Patients are able to view lab/test results, encounter notes, upcoming appointments, etc.  Non-urgent messages can be sent to your provider as well.   To learn more about what you can do with MyChart, go to [email or web address].    Your next appointment:   2 to 3 week(s)  Provider:   Jodelle Red, MD    Other Instructions None

## 2023-01-29 NOTE — Telephone Encounter (Signed)
Following alert received from [employer] received for 3 episodes of AF, longest duration , controlled rates. Burden 0.6%, ASA only per EPIC.  Had SVT ablation 01/15/23 with Dr.   Has upcoming apt with Dr. Ladona Ridgel 03/01/23 for f/u AVT ablation.

## 2023-02-05 NOTE — Telephone Encounter (Signed)
Stop ASA and start eliquis 5 mg bid.

## 2023-02-06 ENCOUNTER — Encounter: Payer: Self-pay | Admitting: Internal Medicine

## 2023-02-06 ENCOUNTER — Ambulatory Visit (INDEPENDENT_AMBULATORY_CARE_PROVIDER_SITE_OTHER): Payer: Medicare Other

## 2023-02-06 ENCOUNTER — Ambulatory Visit (INDEPENDENT_AMBULATORY_CARE_PROVIDER_SITE_OTHER): Payer: Medicare Other | Admitting: Internal Medicine

## 2023-02-06 VITALS — BP 120/72 | HR 53 | Temp 98.1°F | Ht 69.0 in | Wt 165.0 lb

## 2023-02-06 DIAGNOSIS — E7800 Pure hypercholesterolemia, unspecified: Secondary | ICD-10-CM

## 2023-02-06 DIAGNOSIS — S99912A Unspecified injury of left ankle, initial encounter: Secondary | ICD-10-CM | POA: Diagnosis not present

## 2023-02-06 DIAGNOSIS — R739 Hyperglycemia, unspecified: Secondary | ICD-10-CM | POA: Diagnosis not present

## 2023-02-06 DIAGNOSIS — L03116 Cellulitis of left lower limb: Secondary | ICD-10-CM

## 2023-02-06 DIAGNOSIS — E559 Vitamin D deficiency, unspecified: Secondary | ICD-10-CM

## 2023-02-06 DIAGNOSIS — S9002XA Contusion of left ankle, initial encounter: Secondary | ICD-10-CM | POA: Diagnosis not present

## 2023-02-06 LAB — HEPATIC FUNCTION PANEL
ALT: 26 U/L (ref 0–53)
AST: 25 U/L (ref 0–37)
Albumin: 4.1 g/dL (ref 3.5–5.2)
Alkaline Phosphatase: 60 U/L (ref 39–117)
Bilirubin, Direct: 0.4 mg/dL — ABNORMAL HIGH (ref 0.0–0.3)
Total Bilirubin: 1.3 mg/dL — ABNORMAL HIGH (ref 0.2–1.2)
Total Protein: 7.2 g/dL (ref 6.0–8.3)

## 2023-02-06 LAB — LIPID PANEL
Cholesterol: 138 mg/dL (ref 0–200)
HDL: 42.1 mg/dL (ref >39.00)
LDL Cholesterol: 69 mg/dL (ref 0–99)
NonHDL: 95.67
Total CHOL/HDL Ratio: 3
Triglycerides: 131 mg/dL (ref 0.0–149.0)
VLDL: 26.2 mg/dL (ref 0.0–40.0)

## 2023-02-06 LAB — BASIC METABOLIC PANEL
BUN: 37 mg/dL — ABNORMAL HIGH (ref 6–23)
CO2: 22 mEq/L (ref 19–32)
Calcium: 10.3 mg/dL (ref 8.4–10.5)
Chloride: 98 mEq/L (ref 96–112)
Creatinine, Ser: 1.2 mg/dL (ref 0.40–1.50)
GFR: 55.78 mL/min — ABNORMAL LOW (ref >60.00)
Glucose, Bld: 108 mg/dL — ABNORMAL HIGH (ref 70–99)
Potassium: 4.8 mEq/L (ref 3.5–5.1)
Sodium: 138 mEq/L (ref 135–145)

## 2023-02-06 LAB — HEMOGLOBIN A1C: Hgb A1c MFr Bld: 5.9 % (ref 4.6–6.5)

## 2023-02-06 LAB — VITAMIN D 25 HYDROXY (VIT D DEFICIENCY, FRACTURES): VITD: 58.48 ng/mL (ref 30.00–100.00)

## 2023-02-06 MED ORDER — DOXYCYCLINE HYCLATE 100 MG PO TABS: 100.0000 mg | ORAL_TABLET | Freq: Two times a day (BID) | ORAL | 0 refills | Status: DC

## 2023-02-06 NOTE — Progress Notes (Signed)
The test results show that your current treatment is OK, as the tests are stable.  Please continue the same plan.  There is no other need for change of treatment or further evaluation based on these results, at this time.  thanks 

## 2023-02-06 NOTE — Patient Instructions (Signed)
Please take all new medication as prescribed - the antibiotic  Please continue all other medications as before, and refills have been done if requested.  Please have the pharmacy call with any other refills you may need.  Please continue your efforts at being more active, low cholesterol diet, and weight control.  Please keep your appointments with your specialists as you may have planned  Please go to the XRAY Department in the first floor for the x-ray testing  Please go to the LAB at the blood drawing area for the tests to be done  You will be contacted by phone if any changes need to be made immediately.  Otherwise, you will receive a letter about your results with an explanation, but please check with MyChart first.  Please make an Appointment to return in 6 months, or sooner if needed

## 2023-02-06 NOTE — Telephone Encounter (Signed)
Left Voicemail for patient to call device clinic back
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
## 2023-02-07 DIAGNOSIS — S91215A Laceration without foreign body of left lesser toe(s) with damage to nail, initial encounter: Secondary | ICD-10-CM | POA: Diagnosis not present

## 2023-02-07 MED ORDER — APIXABAN 5 MG PO TABS: 5.0000 mg | ORAL_TABLET | Freq: Two times a day (BID) | ORAL | 5 refills | Status: DC

## 2023-02-07 NOTE — Telephone Encounter (Signed)
Spoke with patient.  Educated him on how to take Eliquis and to stop the ASA. Also, patient is aware to avoid any ibuprofen, ASA, aleve containing products. He is to keep appt on 03/01/23 with Dr. Ladona Ridgel.  Patient verbalizes understanding.  Wife in background listening to conversation.

## 2023-02-07 NOTE — Telephone Encounter (Signed)
My Chart message sent

## 2023-02-07 NOTE — Addendum Note (Signed)
Addended by: Barrington Ellison C on: 02/07/2023 11:32 AM   Modules accepted: Orders

## 2023-02-07 NOTE — Telephone Encounter (Signed)
Patient's wife calls to report he has a new wound to his toe where he "ripped" the entire nail off. She has the area bandaged but is worried about him starting a blood thinner with this type of wound.   His wound has not been evaluated by a doctor to determine the severity. I recommended patient get toe wound evaluated as soon as possible.  Depending on severity, may depend on whether he goes ahead and starts the Eliquis today or holds off for a few more days.  Physician evaluating the wound could help with that determination. Otherwise, we do recommend patient start the Eliquis as soon as safe to do so.   Wife taking patient in for evaluation of wound this afternoon.  She will let us know if physician recommendations are to hold Eliquis or not.

## 2023-02-08 ENCOUNTER — Telehealth: Payer: Self-pay | Admitting: Family Medicine

## 2023-02-08 NOTE — Telephone Encounter (Signed)
LVM and sent mychart msg advising pt of r/s needed for 08/06/23 appt- NP out.

## 2023-02-08 NOTE — Progress Notes (Signed)
[facility] Summary Report / Loop Recorder 

## 2023-02-09 ENCOUNTER — Encounter: Payer: Self-pay | Admitting: Internal Medicine

## 2023-02-09 DIAGNOSIS — S99912A Unspecified injury of left ankle, initial encounter: Secondary | ICD-10-CM | POA: Insufficient documentation

## 2023-02-09 NOTE — Assessment & Plan Note (Signed)
Lab Results  Component Value Date   LDLCALC 69 02/06/2023   Stable, pt to continue current statin lipitor 20 qd

## 2023-02-09 NOTE — Assessment & Plan Note (Signed)
Mild to mod, for antibx course doxycycline 100 bid,  to f/u any worsening symptoms or concerns 

## 2023-02-09 NOTE — Assessment & Plan Note (Signed)
Mild but with bruising , for xray plain film

## 2023-02-09 NOTE — Assessment & Plan Note (Signed)
Lab Results  Component Value Date   HGBA1C 5.9 02/06/2023   Stable, pt to continue current medical treatment   diet, wt control

## 2023-02-13 DIAGNOSIS — S80812A Abrasion, left lower leg, initial encounter: Secondary | ICD-10-CM | POA: Diagnosis not present

## 2023-02-15 ENCOUNTER — Ambulatory Visit (INDEPENDENT_AMBULATORY_CARE_PROVIDER_SITE_OTHER): Payer: Medicare Other

## 2023-02-15 VITALS — Ht 69.0 in | Wt 165.0 lb

## 2023-02-15 DIAGNOSIS — Z0000 Encounter for general adult medical examination without abnormal findings: Secondary | ICD-10-CM

## 2023-02-15 NOTE — Patient Instructions (Signed)
Gary Atkins , Thank you for taking time to come for your Medicare Wellness Visit. I appreciate your ongoing commitment to your health goals. Please review the following plan we discussed and let me know if I can assist you in the future.   Referrals/Orders/Follow-Ups/Clinician Recommendations: Each day, aim for 6 glasses of water, plenty of protein in your diet and try to get up and walk/ stretch every hour for 5-10 minutes at a time.    This is a list of the screening recommended for you and due dates:  Health Maintenance  Topic Date Due   Zoster (Shingles) Vaccine (1 of 2) 03/19/1958   Flu Shot  01/18/2023   Medicare Annual Wellness Visit  02/15/2024   DTaP/Tdap/Td vaccine (4 - Td or Tdap) 05/14/2032   Pneumonia Vaccine  Completed   HPV Vaccine  Aged Out   COVID-19 Vaccine  Discontinued    Advanced directives: (Copy Requested) Please bring a copy of your health care power of attorney and living will to the office to be added to your chart at your convenience.  Next Medicare Annual Wellness Visit scheduled for next year: Yes
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
## 2023-02-16 ENCOUNTER — Encounter (HOSPITAL_BASED_OUTPATIENT_CLINIC_OR_DEPARTMENT_OTHER): Payer: Self-pay | Admitting: Cardiology

## 2023-02-16 ENCOUNTER — Ambulatory Visit (HOSPITAL_BASED_OUTPATIENT_CLINIC_OR_DEPARTMENT_OTHER): Payer: Medicare Other | Admitting: Cardiology

## 2023-02-16 VITALS — BP 108/64 | HR 65 | Ht 69.0 in | Wt 165.9 lb

## 2023-02-16 DIAGNOSIS — I480 Paroxysmal atrial fibrillation: Secondary | ICD-10-CM

## 2023-02-16 DIAGNOSIS — I2510 Atherosclerotic heart disease of native coronary artery without angina pectoris: Secondary | ICD-10-CM | POA: Diagnosis not present

## 2023-02-16 DIAGNOSIS — I428 Other cardiomyopathies: Secondary | ICD-10-CM | POA: Diagnosis not present

## 2023-02-16 DIAGNOSIS — I471 Supraventricular tachycardia, unspecified: Secondary | ICD-10-CM | POA: Diagnosis not present

## 2023-02-16 DIAGNOSIS — R600 Localized edema: Secondary | ICD-10-CM | POA: Diagnosis not present

## 2023-02-16 NOTE — Progress Notes (Signed)
Cardiology Office Note:  .    Date:  02/16/2023  ID:  Gary Atkins, DOB 12-21-1940, MRN [id number] PCP: Corwin Levins, MD  [facility] Providers Cardiologist:  Jodelle Red, MD     History of Present Illness: .    Gary Atkins is a 82 y.o. male with a hx of chronic systolic and diastolic heart failure, NICM, nonobstructive CAD, hx of PVCs and AVNRT s/p ablation, paroxysmal atrial fibrillation who is seen for follow up today.     Cardiac history: Chronic systolic and diastolic heart failure, suspected NICM, diagnosed 2011. Workup unrevealing, but EF improved from 35% to 50-55% after PVC ablation. Most recent EF 45-50% in 07/2017. Nonobstructive CAD S/P ablation of PVCs and AVNRT 2011 (Dr. Johney Frame) and Dr. Ladona Ridgel (12/2022) New diagnosis of atrial fibrillation by loop recorder 01/2023   At his visit 01/29/2023, he had recently had repeat SVT ablation about 2 weeks prior. Also had loop recorder implanted. Heart rate had not been over mid-80s post ablation. Weights were stable in the 160s. He was struggling with worsening LE edema on lasix 20 mg daily. Did not have much urine production. We increased his lasix to 40 mg daily and scheduled 2 week follow-up.   Today, he is accompanied by a family member. He reports that his leg swelling has improved overall but is still present. He confirms increased urine production on the 40 mg dose of Lasix. He will occasionally elevate his legs.   Also he states that per Dr. Ladona Ridgel, he was recently started on Eliquis due to atrial fibrillation seen on his loop recorder. ASA was discontinued. He affirms he has never felt any irregular or racing heartbeats.  Discussed working on increasing his exercise. He enjoys gardening, but he has been unable to keep up with the garden this year due to his physical limitations.   Last week he completed updated lab work at his PCP's office. Personally reviewed today.  He denies any chest pain, shortness of  breath, lightheadedness, headaches, syncope, orthopnea, or PND.  ROS:  Please see the history of present illness. ROS otherwise negative except as noted.  (+) BLE edema L>R  Studies Reviewed: Marland Kitchen         Physical Exam:    VS:  BP 108/64   Pulse 65   Ht 5\' 9"  (1.753 m)   Wt 165 lb 14.4 oz (75.3 kg)   SpO2 97%   BMI 24.50 kg/m    Wt Readings from Last 3 Encounters:  02/16/23 165 lb 14.4 oz (75.3 kg)  02/15/23 165 lb (74.8 kg)  02/06/23 165 lb (74.8 kg)    GEN: Well nourished, well developed in no acute distress HEENT: Normal, moist mucous membranes NECK: No JVD CARDIAC: regular rhythm, normal S1 and S2, no rubs or gallops. No murmur. VASCULAR: Radial and DP pulses 2+ bilaterally. No carotid bruits RESPIRATORY:  Clear to auscultation without rales, wheezing or rhonchi  ABDOMEN: Soft, non-tender, non-distended MUSCULOSKELETAL:  Ambulates independently SKIN: Warm and dry, trivial bilateral LE edema L>R NEUROLOGIC:  Alert and oriented x 3. No focal neuro deficits noted. PSYCHIATRIC:  Normal affect   ASSESSMENT AND PLAN: .    History of syncope pSVT PVCs Paroxysmal atrial fibrillation -new diagnosis of atrial fib by loop recorder 01/2023 -s/p repeat SVT ablation -CHA2DS2/VAS Stroke Risk Points=5  -started on apixaban, tolerating   Bilateral LE edema -much improved today -continue 40 mg lasix -BMET last week stable   Nonischemic cardiomyopathy, prior EF 45-50%,  now 60-65% -continues to feel SOB with significant exertion -on carvedilol and lisinopril -thought to be NICM 2/2 PVC burden -We discussed red flag warning signs that need immediate medical attention -previously declined SGLT2i   Nonobstructive CAD: no symptoms -continue atorvastatin -no aspirin as he is now on apixaban   CV risk counseling and prevention -recommend heart healthy/Mediterranean diet, with whole grains, fruits, vegetable, fish, lean meats, nuts, and olive oil. Limit salt. -recommend moderate  walking, 3-5 times/week for 30-50 minutes each session. Aim for at least 150 minutes.week. Goal should be pace of 3 miles/hours, or walking 1.5 miles in 30 minutes -recommend avoidance of tobacco products. Avoid excess alcohol.  Dispo: Follow-up in 3 months, or sooner as needed.  I,Mathew Stumpf,acting as a Neurosurgeon for [employer], MD.,have documented all relevant documentation on the behalf of Jodelle Red, MD,as directed by  Jodelle Red, MD while in the presence of Jodelle Red, MD.  I, Jodelle Red, MD, have reviewed all documentation for this visit. The documentation on 02/16/23 for the exam, diagnosis, procedures, and orders are all accurate and complete.   Signed, Jodelle Red, MD

## 2023-02-16 NOTE — Patient Instructions (Addendum)
Medication Instructions:  Your physician recommends that you continue on your current medications as directed. Please refer to the Current Medication list given to you today.   Labwork: NONE  Testing/Procedures: NONE  Follow-Up: 06/25/2023 10:20 AM WITH DR Cristal Deer   If you need a refill on your cardiac medications before your next appointment, please call your pharmacy.

## 2023-02-20 DIAGNOSIS — H0102A Squamous blepharitis right eye, upper and lower eyelids: Secondary | ICD-10-CM | POA: Diagnosis not present

## 2023-02-20 DIAGNOSIS — Z961 Presence of intraocular lens: Secondary | ICD-10-CM | POA: Diagnosis not present

## 2023-02-20 DIAGNOSIS — H0102B Squamous blepharitis left eye, upper and lower eyelids: Secondary | ICD-10-CM | POA: Diagnosis not present

## 2023-02-20 DIAGNOSIS — H401122 Primary open-angle glaucoma, left eye, moderate stage: Secondary | ICD-10-CM | POA: Diagnosis not present

## 2023-02-20 DIAGNOSIS — H04123 Dry eye syndrome of bilateral lacrimal glands: Secondary | ICD-10-CM | POA: Diagnosis not present

## 2023-02-20 DIAGNOSIS — H2511 Age-related nuclear cataract, right eye: Secondary | ICD-10-CM | POA: Diagnosis not present

## 2023-02-26 ENCOUNTER — Ambulatory Visit (INDEPENDENT_AMBULATORY_CARE_PROVIDER_SITE_OTHER): Payer: Medicare Other

## 2023-02-26 DIAGNOSIS — I480 Paroxysmal atrial fibrillation: Secondary | ICD-10-CM | POA: Diagnosis not present

## 2023-02-26 DIAGNOSIS — R55 Syncope and collapse: Secondary | ICD-10-CM

## 2023-02-26 LAB — CUP PACEART REMOTE DEVICE CHECK
Date Time Interrogation Session: 20240907232419
Implantable Pulse Generator Implant Date: 20240702

## 2023-03-01 ENCOUNTER — Ambulatory Visit: Payer: Medicare Other | Attending: Internal Medicine | Admitting: Internal Medicine

## 2023-03-01 VITALS — BP 102/62 | HR 59 | Ht 69.0 in | Wt 163.4 lb

## 2023-03-01 DIAGNOSIS — I471 Supraventricular tachycardia, unspecified: Secondary | ICD-10-CM
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
## 2023-03-01 NOTE — Patient Instructions (Addendum)
 Medication Instructions:  Your physician recommends that you continue on your current medications as directed. Please refer to the Current Medication list given to you today.  *If you need a refill on your cardiac medications before your next appointment, please call your pharmacy*  Lab Work: None ordered.  If you have labs (blood work) drawn today and your tests are completely normal, you will receive your results only by: MyChart Message (if you have MyChart) OR A paper copy in the mail If you have any lab test that is abnormal or we need to change your treatment, we will call you to review the results.  Testing/Procedures: None ordered.  Follow-Up: At [facility], you and your health needs are our priority.  As part of our continuing mission to provide you with exceptional heart care, we have created designated Provider Care Teams.  These Care Teams include your primary Cardiologist (physician) and Advanced Practice Providers (APPs -  Physician Assistants and Nurse Practitioners) who all work together to provide you with the care you need, when you need it.   Your next appointment:   As needed.  The format for your next appointment:   In Person  Provider:   Lewayne Bunting, MD{or one of the following Advanced Practice Providers on your designated Care Team:   Francis Dowse, [place] Casimiro Needle "Mardelle Matte" [place], [place] Earnest Rosier, NP   Important Information About Sugar

## 2023-03-05 ENCOUNTER — Other Ambulatory Visit: Payer: Self-pay | Admitting: Internal Medicine

## 2023-03-05 ENCOUNTER — Other Ambulatory Visit: Payer: Self-pay

## 2023-03-05 DIAGNOSIS — I2510 Atherosclerotic heart disease of native coronary artery without angina pectoris: Secondary | ICD-10-CM

## 2023-03-07 ENCOUNTER — Encounter: Payer: Self-pay | Admitting: Podiatry

## 2023-03-07 ENCOUNTER — Ambulatory Visit: Payer: Medicare Other | Admitting: Podiatry

## 2023-03-07 ENCOUNTER — Other Ambulatory Visit: Payer: Self-pay

## 2023-03-07 DIAGNOSIS — M79675 Pain in left toe(s): Secondary | ICD-10-CM | POA: Diagnosis not present

## 2023-03-07 DIAGNOSIS — M79674 Pain in right toe(s): Secondary | ICD-10-CM | POA: Diagnosis not present

## 2023-03-07 DIAGNOSIS — B351 Tinea unguium: Secondary | ICD-10-CM

## 2023-03-07 NOTE — Progress Notes (Signed)
This patient returns to the office for evaluation and treatment of long thick painful nails .  This patient is unable to trim his own nails since the patient cannot reach his feet.  Patient says the nails are painful walking and wearing his shoes.  He returns for preventive foot care services.  General Appearance  Alert, conversant and in no acute stress.  Vascular  Dorsalis pedis and posterior tibial  pulses are palpable  bilaterally.  Capillary return is within normal limits  bilaterally. Temperature is within normal limits  bilaterally.  Neurologic  Senn-Weinstein monofilament wire test within normal limits  bilaterally. Muscle power within normal limits bilaterally.  Nails Thick disfigured discolored nails with subungual debris  from hallux to fifth toes bilaterally. No evidence of bacterial infection or drainage bilaterally.  Orthopedic  No limitations of motion  feet .  No crepitus or effusions noted.  No bony pathology or digital deformities noted.  Skin  normotropic skin with no porokeratosis noted bilaterally.  No signs of infections or ulcers noted.     Onychomycosis  Pain in toes right foot  Pain in toes left foot  Debridement  of nails  1-5  B/L with a nail nipper.  Nails were then filed using a dremel tool with no incidents.   Prescribed topical lamisil from [place].  RTC 12 weeks   Helane Gunther DPM

## 2023-03-12 NOTE — Progress Notes (Signed)
[facility] Summary Report / Loop Recorder 

## 2023-03-30 ENCOUNTER — Telehealth: Payer: Self-pay

## 2023-03-30 NOTE — Telephone Encounter (Signed)
Repeat ILR alert for tachy episode   Event occurred 10/10 @ 14:37, duration 35sec, mean HR 162, narrow regular rhythm Hx of SVT ablation July 2024.  Numerous SVT events triggered since implant.   Can we increase tachy rate to 167, 48beats to decrease non-actionable alerts, pt historically asymptomatic?

## 2023-04-02 ENCOUNTER — Ambulatory Visit (INDEPENDENT_AMBULATORY_CARE_PROVIDER_SITE_OTHER): Payer: Medicare Other

## 2023-04-02 DIAGNOSIS — R55 Syncope and collapse: Secondary | ICD-10-CM | POA: Diagnosis not present

## 2023-04-02 DIAGNOSIS — I480 Paroxysmal atrial fibrillation: Secondary | ICD-10-CM

## 2023-04-03 LAB — CUP PACEART REMOTE DEVICE CHECK
Date Time Interrogation Session: 20241013231540
Implantable Pulse Generator Implant Date: 20240702

## 2023-04-04 ENCOUNTER — Telehealth: Payer: Self-pay | Admitting: *Deleted

## 2023-04-04 NOTE — Telephone Encounter (Signed)
   Pre-operative Risk Assessment    Patient Name: Gary Atkins  DOB: [birth year] MRN: [id number]    DATE OF LAST VISIT: 03/01/23 DR. Ladona Ridgel DATE OF NEXT VISIT: 06/25/23 DR. BRIDGETTE CHRISTOPHER  Request for Surgical Clearance    Procedure:   CATARACT EXTRACTION RIGHT EYE  Date of Surgery:  Clearance 04/26/23                                 Surgeon:  DR. Dione Booze Surgeon's Group or Practice Name:  [facility] Phone number:  [phone] Fax number:  [phone]   Type of Clearance Requested:   - Medical ; PER REQUEST NO NEED TO HOLD BLOOD THINNERS   Type of Anesthesia:   TOPICAL    Additional requests/questions:    Elpidio Anis   04/04/2023, 11:55 AM

## 2023-04-04 NOTE — Telephone Encounter (Signed)
   Patient Name: Gary Atkins  DOB: February 22, 1941 MRN: [id number]  Primary Cardiologist: Jodelle Red, MD  Chart reviewed as part of pre-operative protocol coverage. Cataract extractions are recognized in guidelines as low risk surgeries that do not typically require specific preoperative testing or holding of blood thinner therapy. Therefore, given past medical history and time since last visit, based on ACC/AHA guidelines, Gary Atkins would be at acceptable risk for the planned procedure without further cardiovascular testing.   I will route this recommendation to the requesting party via Epic fax function and remove from pre-op pool.  Please call with questions.  Sharlene Dory, PA-C 04/04/2023, 12:19 PM

## 2023-04-15 ENCOUNTER — Other Ambulatory Visit: Payer: Self-pay | Admitting: Internal Medicine

## 2023-04-16 ENCOUNTER — Other Ambulatory Visit: Payer: Self-pay

## 2023-04-17 NOTE — Progress Notes (Signed)
[facility] Summary Report / Loop Recorder 

## 2023-04-18 NOTE — Telephone Encounter (Signed)
Most consistent with atrial tach

## 2023-04-26 DIAGNOSIS — H2511 Age-related nuclear cataract, right eye: Secondary | ICD-10-CM | POA: Diagnosis not present

## 2023-05-02 NOTE — Telephone Encounter (Signed)
Provider out of office, will revisit alert update at next scheduled remote.

## 2023-05-07 ENCOUNTER — Ambulatory Visit (INDEPENDENT_AMBULATORY_CARE_PROVIDER_SITE_OTHER): Payer: Medicare Other

## 2023-05-07 DIAGNOSIS — R55 Syncope and collapse: Secondary | ICD-10-CM

## 2023-05-07 LAB — CUP PACEART REMOTE DEVICE CHECK
Date Time Interrogation Session: 20241117233657
Implantable Pulse Generator Implant Date: 20240702

## 2023-05-30 NOTE — Progress Notes (Signed)
[facility] Summary Report / Loop Recorder 

## 2023-06-06 ENCOUNTER — Encounter: Payer: Self-pay | Admitting: Podiatry

## 2023-06-06 ENCOUNTER — Ambulatory Visit: Payer: Medicare Other | Admitting: Podiatry

## 2023-06-06 DIAGNOSIS — M79675 Pain in left toe(s): Secondary | ICD-10-CM

## 2023-06-06 DIAGNOSIS — B351 Tinea unguium: Secondary | ICD-10-CM

## 2023-06-06 DIAGNOSIS — M79674 Pain in right toe(s): Secondary | ICD-10-CM

## 2023-06-06 NOTE — Progress Notes (Signed)
This patient returns to the office for evaluation and treatment of long thick painful nails .  This patient is unable to trim his own nails since the patient cannot reach his feet.  Patient says the nails are painful walking and wearing his shoes.  He returns for preventive foot care services.  General Appearance  Alert, conversant and in no acute stress.  Vascular  Dorsalis pedis and posterior tibial  pulses are palpable  bilaterally.  Capillary return is within normal limits  bilaterally. Temperature is within normal limits  bilaterally.  Neurologic  Senn-Weinstein monofilament wire test within normal limits  bilaterally. Muscle power within normal limits bilaterally.  Nails Thick disfigured discolored nails with subungual debris  from hallux to fifth toes bilaterally. No evidence of bacterial infection or drainage bilaterally.  Orthopedic  No limitations of motion  feet .  No crepitus or effusions noted.  No bony pathology or digital deformities noted.  Skin  normotropic skin with no porokeratosis noted bilaterally.  No signs of infections or ulcers noted.     Onychomycosis  Pain in toes right foot  Pain in toes left foot  Debridement  of nails  1-5  B/L with a nail nipper.  Nails were then filed using a dremel tool with no incidents.   Prescribed topical lamisil from [place].  RTC 12 weeks   Helane Gunther DPM

## 2023-06-08 ENCOUNTER — Telehealth: Payer: Self-pay

## 2023-06-08 NOTE — Telephone Encounter (Signed)
Patient called stating that medication for toe has not been sent to [facility].

## 2023-06-11 ENCOUNTER — Ambulatory Visit (INDEPENDENT_AMBULATORY_CARE_PROVIDER_SITE_OTHER): Payer: Medicare Other

## 2023-06-11 DIAGNOSIS — R55 Syncope and collapse: Secondary | ICD-10-CM | POA: Diagnosis not present

## 2023-06-11 DIAGNOSIS — I480 Paroxysmal atrial fibrillation: Secondary | ICD-10-CM

## 2023-06-11 LAB — CUP PACEART REMOTE DEVICE CHECK
Date Time Interrogation Session: 20241222232507
Implantable Pulse Generator Implant Date: 20240702

## 2023-06-14 ENCOUNTER — Ambulatory Visit: Payer: Self-pay | Admitting: Internal Medicine

## 2023-06-14 NOTE — Telephone Encounter (Signed)
  Chief Complaint: Severe right hip pain Symptoms: Severe right hip pain localized Frequency: constant for a few weeks Pertinent Negatives: Patient denies radiating pain elsewhere, major hip injury Disposition: [] ED /[] Urgent Care (no appt availability in office) / [x] Appointment(In office/virtual)/ []  [facility]/ [] Home Care/ [] Refused Recommended Disposition /[] [facility]/ []  Follow-up with PCP Additional Notes: Patient complains of severe right hip pain. States it is not radiating elsewhere. States he has had this similar pain in his shoulder that was resolved by a cortisone shot. Would like to be seen asap. Made patient earliest appt and added to wait list for any spots that may open up.   Copied from CRM [id number]. Topic: Clinical - Red Word Triage >> Jun 14, 2023 12:22 PM Leavy Cella D wrote: Red Word that prompted transfer to Nurse Triage: severe pain in right hip Reason for Disposition  [1] MODERATE pain (e.g., interferes with normal activities, limping) AND [2] present > 3 days  Answer Assessment - Initial Assessment Questions 1. LOCATION and RADIATION: "Where is the pain located?"      Right hip pain 2. QUALITY: "What does the pain feel like?"  (e.g., sharp, dull, aching, burning)     Aches 3. SEVERITY: "How bad is the pain?" "What does it keep you from doing?"   (Scale 1-10; or mild, moderate, severe)   -  MILD (1-3): doesn't interfere with normal activities    -  MODERATE (4-7): interferes with normal activities (e.g., work or school) or awakens from sleep, limping    -  SEVERE (8-10): excruciating pain, unable to do any normal activities, unable to walk     7 or 8 on most nights 4. ONSET: "When did the pain start?" "Does it come and go, or is it there all the time?"     Several weeks ago and getting progressively worse 5. WORK OR EXERCISE: "Has there been any recent work or exercise that involved this part of the body?"      No 6. CAUSE: "What do you think  is causing the hip pain?"      I do not know 7. AGGRAVATING FACTORS: "What makes the hip pain worse?" (e.g., walking, climbing stairs, running)     No 8. OTHER SYMPTOMS: "Do you have any other symptoms?" (e.g., back pain, pain shooting down leg,  fever, rash)     No  Protocols used: Hip Pain-A-AH

## 2023-06-15 NOTE — Telephone Encounter (Signed)
Called and scheduled Pt for sooner appt on 06/21/23.

## 2023-06-21 ENCOUNTER — Encounter: Payer: Self-pay | Admitting: Internal Medicine

## 2023-06-21 ENCOUNTER — Ambulatory Visit: Payer: Medicare Other

## 2023-06-21 ENCOUNTER — Ambulatory Visit (INDEPENDENT_AMBULATORY_CARE_PROVIDER_SITE_OTHER): Payer: Medicare Other | Admitting: Internal Medicine

## 2023-06-21 VITALS — BP 82/62 | HR 77 | Temp 97.8°F | Ht 69.0 in | Wt 167.2 lb

## 2023-06-21 DIAGNOSIS — Z0000 Encounter for general adult medical examination without abnormal findings: Secondary | ICD-10-CM | POA: Diagnosis not present

## 2023-06-21 DIAGNOSIS — E559 Vitamin D deficiency, unspecified: Secondary | ICD-10-CM

## 2023-06-21 DIAGNOSIS — I5042 Chronic combined systolic (congestive) and diastolic (congestive) heart failure: Secondary | ICD-10-CM | POA: Diagnosis not present

## 2023-06-21 DIAGNOSIS — M545 Low back pain, unspecified: Secondary | ICD-10-CM

## 2023-06-21 DIAGNOSIS — I959 Hypotension, unspecified: Secondary | ICD-10-CM

## 2023-06-21 DIAGNOSIS — M47817 Spondylosis without myelopathy or radiculopathy, lumbosacral region: Secondary | ICD-10-CM | POA: Diagnosis not present

## 2023-06-21 DIAGNOSIS — M47816 Spondylosis without myelopathy or radiculopathy, lumbar region: Secondary | ICD-10-CM | POA: Diagnosis not present

## 2023-06-21 DIAGNOSIS — Z0001 Encounter for general adult medical examination with abnormal findings: Secondary | ICD-10-CM

## 2023-06-21 DIAGNOSIS — E7800 Pure hypercholesterolemia, unspecified: Secondary | ICD-10-CM

## 2023-06-21 DIAGNOSIS — E538 Deficiency of other specified B group vitamins: Secondary | ICD-10-CM | POA: Diagnosis not present

## 2023-06-21 DIAGNOSIS — I2510 Atherosclerotic heart disease of native coronary artery without angina pectoris: Secondary | ICD-10-CM

## 2023-06-21 DIAGNOSIS — Z23 Encounter for immunization: Secondary | ICD-10-CM

## 2023-06-21 DIAGNOSIS — M4856XA Collapsed vertebra, not elsewhere classified, lumbar region, initial encounter for fracture: Secondary | ICD-10-CM | POA: Diagnosis not present

## 2023-06-21 DIAGNOSIS — R739 Hyperglycemia, unspecified: Secondary | ICD-10-CM

## 2023-06-21 LAB — VITAMIN B12: Vitamin B-12: >1537 pg/mL — ABNORMAL HIGH (ref 211–911)

## 2023-06-21 LAB — CBC WITH DIFFERENTIAL/PLATELET
Basophils Absolute: 0.1 10*3/uL (ref 0.0–0.1)
Basophils Relative: 0.8 % (ref 0.0–3.0)
Eosinophils Absolute: 0.4 10*3/uL (ref 0.0–0.7)
Eosinophils Relative: 5.5 % — ABNORMAL HIGH (ref 0.0–5.0)
HCT: 47.4 % (ref 39.0–52.0)
Hemoglobin: 15.9 g/dL (ref 13.0–17.0)
Lymphocytes Relative: 33.8 % (ref 12.0–46.0)
Lymphs Abs: 2.2 10*3/uL (ref 0.7–4.0)
MCHC: 33.7 g/dL (ref 30.0–36.0)
MCV: 98.1 fL (ref 78.0–100.0)
Monocytes Absolute: 0.7 10*3/uL (ref 0.1–1.0)
Monocytes Relative: 10.7 % (ref 3.0–12.0)
Neutro Abs: 3.3 10*3/uL (ref 1.4–7.7)
Neutrophils Relative %: 49.2 % (ref 43.0–77.0)
Platelets: 157 10*3/uL (ref 150.0–400.0)
RBC: 4.83 Mil/uL (ref 4.22–5.81)
RDW: 13.8 % (ref 11.5–15.5)
WBC: 6.6 10*3/uL (ref 4.0–10.5)

## 2023-06-21 LAB — BASIC METABOLIC PANEL
BUN: 16 mg/dL (ref 6–23)
CO2: 24 meq/L (ref 19–32)
Calcium: 9.9 mg/dL (ref 8.4–10.5)
Chloride: 106 meq/L (ref 96–112)
Creatinine, Ser: 1.04 mg/dL (ref 0.40–1.50)
GFR: 66.05 mL/min (ref >60.00)
Glucose, Bld: 101 mg/dL — ABNORMAL HIGH (ref 70–99)
Potassium: 4.8 meq/L (ref 3.5–5.1)
Sodium: 141 meq/L (ref 135–145)

## 2023-06-21 LAB — HEPATIC FUNCTION PANEL
ALT: 23 U/L (ref 0–53)
AST: 21 U/L (ref 0–37)
Albumin: 4.1 g/dL (ref 3.5–5.2)
Alkaline Phosphatase: 57 U/L (ref 39–117)
Bilirubin, Direct: 0.2 mg/dL (ref 0.0–0.3)
Total Bilirubin: 0.8 mg/dL (ref 0.2–1.2)
Total Protein: 7.2 g/dL (ref 6.0–8.3)

## 2023-06-21 LAB — HEMOGLOBIN A1C: Hgb A1c MFr Bld: 5.9 % (ref 4.6–6.5)

## 2023-06-21 LAB — URINALYSIS, ROUTINE W REFLEX MICROSCOPIC
Bilirubin Urine: NEGATIVE
Hgb urine dipstick: NEGATIVE
Leukocytes,Ua: NEGATIVE
Nitrite: NEGATIVE
RBC / HPF: NONE SEEN (ref 0–?)
Specific Gravity, Urine: 1.02 (ref 1.000–1.030)
Total Protein, Urine: NEGATIVE
Urine Glucose: NEGATIVE
Urobilinogen, UA: 0.2 (ref 0.0–1.0)
pH: 6 (ref 5.0–8.0)

## 2023-06-21 LAB — LIPID PANEL
Cholesterol: 138 mg/dL (ref 0–200)
HDL: 51.4 mg/dL (ref >39.00)
LDL Cholesterol: 68 mg/dL (ref 0–99)
NonHDL: 86.84
Total CHOL/HDL Ratio: 3
Triglycerides: 92 mg/dL (ref 0.0–149.0)
VLDL: 18.4 mg/dL (ref 0.0–40.0)

## 2023-06-21 LAB — TSH: TSH: 3.1 u[IU]/mL (ref 0.35–5.50)

## 2023-06-21 LAB — VITAMIN D 25 HYDROXY (VIT D DEFICIENCY, FRACTURES): VITD: 57.47 ng/mL (ref 30.00–100.00)

## 2023-06-21 MED ORDER — TRAMADOL HCL 50 MG PO TABS
50.0000 mg | ORAL_TABLET | Freq: Four times a day (QID) | ORAL | 0 refills | Status: DC | PRN
Start: 2023-06-21 — End: 2023-07-23
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
## 2023-06-21 NOTE — Patient Instructions (Addendum)
 Please have your Shingrix (shingles) shots done at your local pharmacy., and the RSV shot as well  Your BP was repeated today left arm medium cuff - 108/76  You had the flu shot today  Please take all new medication as prescribed - the tramadol  as needed for pain  Please continue all other medications as before, and refills have been done if requested.  Please have the pharmacy call with any other refills you may need.  Please continue your efforts at being more active, low cholesterol diet, and weight control.  You are otherwise up to date with prevention measures today.  Please keep your appointments with your specialists as you may have planned  Please go to the XRAY Department in the first floor for the x-ray testing  Please go to the LAB at the blood drawing area for the tests to be done  You will be contacted by phone if any changes need to be made immediately.  Otherwise, you will receive a letter about your results with an explanation, but please check with MyChart first.  Please make an Appointment to return in Feb 20 , or sooner if needed

## 2023-06-21 NOTE — Progress Notes (Signed)
 The test results show that your current treatment is OK, as the tests are stable.  Please continue the same plan.  There is no other need for change of treatment or further evaluation based on these results, at this time.  thanks

## 2023-06-24 ENCOUNTER — Encounter: Payer: Self-pay | Admitting: Internal Medicine

## 2023-06-24 DIAGNOSIS — M545 Low back pain, unspecified: Secondary | ICD-10-CM | POA: Insufficient documentation

## 2023-06-24 DIAGNOSIS — I959 Hypotension, unspecified: Secondary | ICD-10-CM | POA: Insufficient documentation

## 2023-06-24 NOTE — Assessment & Plan Note (Signed)
 Noted on intake today, repeat BP low normal, cont current med tx

## 2023-06-24 NOTE — Assessment & Plan Note (Signed)
 2 wks new onset, can't r/o recent compression fx, for plain films, and if found new fx would likely need MRI LS spine and Neurosurg referral for possible hyphoplasty

## 2023-06-24 NOTE — Assessment & Plan Note (Signed)
 Lab Results  Component Value Date   LDLCALC 68 06/21/2023   Stable, pt to continue current statin lipitor 20 qd

## 2023-06-24 NOTE — Assessment & Plan Note (Signed)
 Lab Results  Component Value Date   HGBA1C 5.9 06/21/2023   Stable, pt to continue current medical treatment   - diet,wt control

## 2023-06-24 NOTE — Assessment & Plan Note (Signed)
Stable volume, cont current med tx 

## 2023-06-24 NOTE — Assessment & Plan Note (Signed)
Stable, continue current med tx ?

## 2023-06-24 NOTE — Assessment & Plan Note (Signed)
 Age and sex appropriate education and counseling updated with regular exercise and diet Referrals for preventative services - none needed Immunizations addressed - for flu shot, for shingrix at pharmacy Smoking counseling  - none needed Evidence for depression or other mood disorder - none significant Most recent labs reviewed. I have personally reviewed and have noted: 1) the patient's medical and social history 2) The patient's current medications and supplements 3) The patient's height, weight, and BMI have been recorded in the chart

## 2023-06-25 ENCOUNTER — Encounter (HOSPITAL_BASED_OUTPATIENT_CLINIC_OR_DEPARTMENT_OTHER): Payer: Self-pay | Admitting: Cardiology

## 2023-06-25 ENCOUNTER — Ambulatory Visit (HOSPITAL_BASED_OUTPATIENT_CLINIC_OR_DEPARTMENT_OTHER): Payer: Medicare Other | Admitting: Cardiology

## 2023-06-25 VITALS — BP 100/60 | HR 83 | Resp 16 | Ht 69.0 in | Wt 165.0 lb

## 2023-06-25 DIAGNOSIS — I872 Venous insufficiency (chronic) (peripheral): Secondary | ICD-10-CM | POA: Diagnosis not present

## 2023-06-25 DIAGNOSIS — I493 Ventricular premature depolarization: Secondary | ICD-10-CM | POA: Diagnosis not present

## 2023-06-25 DIAGNOSIS — I480 Paroxysmal atrial fibrillation: Secondary | ICD-10-CM

## 2023-06-25 DIAGNOSIS — I2510 Atherosclerotic heart disease of native coronary artery without angina pectoris: Secondary | ICD-10-CM | POA: Diagnosis not present

## 2023-06-25 DIAGNOSIS — Z87898 Personal history of other specified conditions: Secondary | ICD-10-CM

## 2023-06-25 DIAGNOSIS — I428 Other cardiomyopathies: Secondary | ICD-10-CM

## 2023-06-25 NOTE — Progress Notes (Signed)
 Cardiology Office Note:  .    Date:  06/25/2023  ID:  Gary Atkins, DOB April 29, 1941, MRN [id number] PCP: [name], MD  [facility] Providers Cardiologist:  [name], MD     History of Present Illness: .    Gary Atkins is a 83 y.o. male with a hx of chronic systolic and diastolic heart failure, NICM, nonobstructive CAD, hx of PVCs and AVNRT s/p ablation, paroxysmal atrial fibrillation who is seen for follow up today.     Cardiac history: Chronic systolic and diastolic heart failure, suspected NICM, diagnosed 2011. Workup unrevealing, but EF improved from 35% to 50-55% after PVC ablation. Most recent EF 45-50% in 07/2017. Nonobstructive CAD S/P ablation of PVCs and AVNRT 2011 (Dr. Kelsie) and Dr. Waddell (12/2022) New diagnosis of atrial fibrillation by loop recorder 01/2023  Today: Overall doing well from a heart perspective. Doesn't feel palpitations. Reviewed recent loop, noted to have 10 afib episodes, 10-36 minutes in duration. Hasn't felt them. Tolerating apixaban , no bleeding issues.   Has been limited in activity, more by choice than limitations. Walks in the house without limitations.  Sometimes feels tired after he eats a big meal.  Chronic LE edema is unchanged; stable from prior.  Has some intermittent sharp pinching at his loop site, no other chest pain.  ROS:  Denies shortness of breath at rest or with normal exertion. No PND, orthopnea, change in LE edema or unexpected weight gain. No syncope or palpitations. ROS otherwise negative except as noted.    Studies Reviewed: SABRA    EKG Interpretation Date/Time:  Monday June 25 2023 10:43:03 EST Ventricular Rate:  83 PR Interval:  228 QRS Duration:  88 QT Interval:  376 QTC Calculation: 441 R Axis:   -49  Text Interpretation: Sinus rhythm with marked sinus arrhythmia with 1st degree A-V block Low voltage QRS Left anterior fascicular block Cannot rule out Anterior infarct , age undetermined  Confirmed by Bruckner Shelda [phone]) on 06/25/2023 11:17:20 AM    Physical Exam:    VS:  BP 100/60 (BP Location: Right Arm, Patient Position: Sitting, Cuff Size: Normal)   Pulse 83   Resp 16   Ht 5' 9 (1.753 m)   Wt 165 lb (74.8 kg)   SpO2 96%   BMI 24.37 kg/m    Wt Readings from Last 3 Encounters:  06/25/23 165 lb (74.8 kg)  06/21/23 167 lb 3.2 oz (75.8 kg)  03/01/23 163 lb 6.4 oz (74.1 kg)    GEN: Well nourished, well developed in no acute distress HEENT: Normal, moist mucous membranes NECK: No JVD CARDIAC: somewhat irregular rhythm, normal S1 and S2, no rubs or gallops. No murmur. VASCULAR: Radial and DP pulses 2+ bilaterally. No carotid bruits RESPIRATORY:  Clear to auscultation without rales, wheezing or rhonchi  ABDOMEN: Soft, non-tender, non-distended MUSCULOSKELETAL:  Ambulates independently SKIN: Warm and dry, trivial bilateral LE edema L>R with mild skin discoloration NEUROLOGIC:  Alert and oriented x 3. No focal neuro deficits noted. PSYCHIATRIC:  Normal affect   ASSESSMENT AND PLAN: .    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 Fernande -thought to be NICM 2/2 PVC burden -We discussed red flag warning signs that need immediate medical attention -previously declined SGLT2i   Nonobstructive CAD: no symptoms -continue atorvastatin  -no aspirin as he is now on apixaban    CV risk counseling and prevention -recommend heart healthy/Mediterranean diet, with whole grains, fruits, vegetable, fish, lean meats,  nuts, and olive oil. Limit salt. -recommend moderate walking, 3-5 times/week for 30-50 minutes each session. Aim for at least 150 minutes.week. Goal should be pace of 3 miles/hours, or walking 1.5 miles in 30 minutes -recommend avoidance of tobacco products. Avoid excess alcohol.  Dispo: Follow-up in 6 months, or sooner as needed.  Signed, [name], MD

## 2023-06-25 NOTE — Patient Instructions (Signed)

## 2023-06-26 ENCOUNTER — Ambulatory Visit: Payer: Medicare Other | Admitting: Internal Medicine

## 2023-07-16 ENCOUNTER — Ambulatory Visit: Payer: Medicare Other

## 2023-07-16 DIAGNOSIS — I480 Paroxysmal atrial fibrillation: Secondary | ICD-10-CM | POA: Diagnosis not present

## 2023-07-16 LAB — CUP PACEART REMOTE DEVICE CHECK
Date Time Interrogation Session: 20250126232440
Implantable Pulse Generator Implant Date: 20240702

## 2023-07-18 ENCOUNTER — Encounter: Payer: Self-pay | Admitting: Internal Medicine

## 2023-07-18 NOTE — Progress Notes (Signed)
[facility] Summary Report / Loop Recorder

## 2023-07-21 ENCOUNTER — Other Ambulatory Visit: Payer: Self-pay | Admitting: Internal Medicine

## 2023-07-24 ENCOUNTER — Telehealth: Payer: Self-pay | Admitting: Internal Medicine

## 2023-07-24 DIAGNOSIS — M546 Pain in thoracic spine: Secondary | ICD-10-CM | POA: Diagnosis not present

## 2023-07-24 NOTE — Telephone Encounter (Signed)
Pt spouse called in asking pt's loop recorder is compatible with MRI. Please advise.

## 2023-07-24 NOTE — Telephone Encounter (Signed)
Pt wife called back and I let her know that ILR is compatible

## 2023-07-24 NOTE — Telephone Encounter (Signed)
Attempted to contact patient/wife to advise ILR is compatible with MRI. Left detailed VM on answering machine as DPR states ok.

## 2023-08-06 ENCOUNTER — Ambulatory Visit: Payer: Medicare Other | Admitting: Family Medicine

## 2023-08-06 DIAGNOSIS — M5414 Radiculopathy, thoracic region: Secondary | ICD-10-CM | POA: Diagnosis not present

## 2023-08-07 ENCOUNTER — Encounter: Payer: Self-pay | Admitting: Family Medicine

## 2023-08-07 ENCOUNTER — Ambulatory Visit: Payer: Medicare Other | Admitting: Family Medicine

## 2023-08-07 VITALS — BP 106/74 | HR 100 | Ht 69.0 in | Wt 164.0 lb

## 2023-08-07 DIAGNOSIS — G8929 Other chronic pain: Secondary | ICD-10-CM | POA: Diagnosis not present

## 2023-08-07 DIAGNOSIS — R413 Other amnesia: Secondary | ICD-10-CM | POA: Diagnosis not present

## 2023-08-07 DIAGNOSIS — G40909 Epilepsy, unspecified, not intractable, without status epilepticus: Secondary | ICD-10-CM

## 2023-08-07 DIAGNOSIS — M545 Low back pain, unspecified: Secondary | ICD-10-CM

## 2023-08-07 NOTE — Patient Instructions (Signed)
Below is our plan:  We will continue current treatment plan.   Please make sure you are consistent with timing of seizure medication. I recommend annual visit with primary care provider (PCP) for complete physical and routine blood work. I recommend daily intake of vitamin D (400-800iu) and calcium (800-1000mg ) for bone health. Discuss Dexa screening with PCP.   According to [place] law, you can not drive unless you are seizure / syncope free for at least 6 months and under physician's care.  Please maintain precautions. Do not participate in activities where a loss of awareness could harm you or someone else. No swimming alone, no tub bathing, no hot tubs, no driving, no operating motorized vehicles (cars, ATVs, motocycles, etc), lawnmowers, power tools or firearms. No standing at heights, such as rooftops, ladders or stairs. Avoid hot objects such as stoves, heaters, open fires. Wear a helmet when riding a bicycle, scooter, skateboard, etc. and avoid areas of traffic. Set your water heater to 120 degrees or less.  SUDEP is the sudden, unexpected death of someone with epilepsy, who was otherwise healthy. In SUDEP cases, no other cause of death is found when an autopsy is done. Each year, more than 1 in 1,000 people with epilepsy die from SUDEP. This is the leading cause of death in people with uncontrolled seizures. Until further answers are available, the best way to prevent SUDEP is to lower your risk by controlling seizures. Research has found that people with all types of epilepsy that experience convulsive seizures can be at risk.  Please make sure you are staying well hydrated. I recommend 50-60 ounces daily. Well balanced diet and regular exercise encouraged. Consistent sleep schedule with 6-8 hours recommended.   Please continue follow up with care team as directed.   Follow up with Dr Epimenio Foot in 1 year   You may receive a survey regarding today's visit. I encourage you to leave honest feed back  as I do use this information to improve patient care. Thank you for seeing me today!   Management of Memory Problems   There are some general things you can do to help manage your memory problems.  Your memory may not in fact recover, but by using techniques and strategies you will be able to manage your memory difficulties better.   1)  Establish a routine. Try to establish and then stick to a regular routine.  By doing this, you will get used to what to expect and you will reduce the need to rely on your memory.  Also, try to do things at the same time of day, such as taking your medication or checking your calendar first thing in the morning. Think about think that you can do as a part of a regular routine and make a list.  Then enter them into a daily planner to remind you.  This will help you establish a routine.   2)  Organize your environment. Organize your environment so that it is uncluttered.  Decrease visual stimulation.  Place everyday items such as keys or cell phone in the same place every day (ie.  Basket next to front door) Use post it notes with a brief message to yourself (ie. Turn off light, lock the door) Use labels to indicate where things go (ie. Which cupboards are for food, dishes, etc.) Keep a notepad and pen by the telephone to take messages   3)  Memory Aids A diary or journal/notebook/daily planner Making a list (shopping list, chore list,  to do list that needs to be done) Using an alarm as a reminder (kitchen timer or cell phone alarm) Using cell phone to store information (Notes, Calendar, Reminders) Calendar/White board placed in a prominent position Post-it notes   In order for memory aids to be useful, you need to have good habits.  It's no good remembering to make a note in your journal if you don't remember to look in it.  Try setting aside a certain time of day to look in journal.   4)  Improving mood and managing fatigue. There may be other factors that  contribute to memory difficulties.  Factors, such as anxiety, depression and tiredness can affect memory. Regular gentle exercise can help improve your mood and give you more energy. Exercise: there are short videos created by the [employer] on Health specially for older adults: https://bit.ly/2I30q97.  Mediterranean diet: which emphasizes fruits, vegetables, whole grains, legumes, fish, and other seafood; unsaturated fats such as olive oils; and low amounts of red meat, eggs, and sweets. A variation of this, called MIND (Mediterranean-DASH Intervention for Neurodegenerative Delay) incorporates the DASH (Dietary Approaches to Stop Hypertension) diet, which has been shown to lower high blood pressure, a risk factor for Alzheimer's disease. More information at: ExitMarketing.de.  Aerobic exercise that improve heart health is also good for the mind.  [employer] on Aging have short videos for exercises that you can do at home: BlindWorkshop.com.pt Simple relaxation techniques may help relieve symptoms of anxiety Try to get back to completing activities or hobbies you enjoyed doing in the past. Learn to pace yourself through activities to decrease fatigue. Find out about some local support groups where you can share experiences with others. Try and achieve 7-8 hours of sleep at night.   Tasks to improve attention/working memory 1. Good sleep hygiene (7-8 hrs of sleep) 2. Learning a new skill (Painting, Carpentry, Pottery, new language, Knitting). 3.Cognitive exercises (keep a daily journal, Puzzles) 4. Physical exercise and training  (30 min/day X 4 days week) 5. Being on Antidepressant if needed 6.Yoga, Meditation, Tai Chi 7. Decrease alcohol intake 8.Have a clear schedule and structure in daily routine   MIND Diet: The Mediterranean-DASH Diet Intervention for Neurodegenerative Delay, or MIND diet,  targets the health of the aging brain. Research participants with the highest MIND diet scores had a significantly slower rate of cognitive decline compared with those with the lowest scores. The effects of the MIND diet on cognition showed greater effects than either the Mediterranean or the DASH diet alone.   The healthy items the MIND diet guidelines suggest include:   3+ servings a day of whole grains 1+ servings a day of vegetables (other than green leafy) 6+ servings a week of green leafy vegetables 5+ servings a week of nuts 4+ meals a week of beans 2+ servings a week of berries 2+ meals a week of poultry 1+ meals a week of fish Mainly olive oil if added fat is used   The unhealthy items, which are higher in saturated and trans fat, include: Less than 5 servings a week of pastries and sweets Less than 4 servings a week of red meat (including beef, pork, lamb, and products made from these meats) Less than one serving a week of cheese and fried foods Less than 1 tablespoon a day of butter/stick margarine
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
## 2023-08-09 ENCOUNTER — Ambulatory Visit: Payer: Medicare Other | Admitting: Internal Medicine

## 2023-08-13 DIAGNOSIS — M546 Pain in thoracic spine: Secondary | ICD-10-CM | POA: Diagnosis not present

## 2023-08-16 DIAGNOSIS — R2689 Other abnormalities of gait and mobility: Secondary | ICD-10-CM | POA: Diagnosis not present

## 2023-08-16 DIAGNOSIS — M546 Pain in thoracic spine: Secondary | ICD-10-CM | POA: Diagnosis not present

## 2023-08-20 ENCOUNTER — Ambulatory Visit (INDEPENDENT_AMBULATORY_CARE_PROVIDER_SITE_OTHER): Payer: Medicare Other

## 2023-08-20 ENCOUNTER — Encounter: Payer: Self-pay | Admitting: Internal Medicine

## 2023-08-20 DIAGNOSIS — R55 Syncope and collapse: Secondary | ICD-10-CM

## 2023-08-21 LAB — CUP PACEART REMOTE DEVICE CHECK
Date Time Interrogation Session: 20250302231705
Implantable Pulse Generator Implant Date: 20240702

## 2023-08-23 NOTE — Progress Notes (Signed)
 [facility] Summary Report / Loop Recorder

## 2023-08-29 DIAGNOSIS — R2689 Other abnormalities of gait and mobility: Secondary | ICD-10-CM | POA: Diagnosis not present

## 2023-08-29 DIAGNOSIS — M546 Pain in thoracic spine: Secondary | ICD-10-CM | POA: Diagnosis not present

## 2023-08-31 DIAGNOSIS — H401122 Primary open-angle glaucoma, left eye, moderate stage: Secondary | ICD-10-CM | POA: Diagnosis not present

## 2023-08-31 DIAGNOSIS — H0102B Squamous blepharitis left eye, upper and lower eyelids: Secondary | ICD-10-CM | POA: Diagnosis not present

## 2023-08-31 DIAGNOSIS — H04123 Dry eye syndrome of bilateral lacrimal glands: Secondary | ICD-10-CM | POA: Diagnosis not present

## 2023-08-31 DIAGNOSIS — H0102A Squamous blepharitis right eye, upper and lower eyelids: Secondary | ICD-10-CM | POA: Diagnosis not present

## 2023-09-05 DIAGNOSIS — M546 Pain in thoracic spine: Secondary | ICD-10-CM | POA: Diagnosis not present

## 2023-09-05 DIAGNOSIS — R2689 Other abnormalities of gait and mobility: Secondary | ICD-10-CM | POA: Diagnosis not present

## 2023-09-10 ENCOUNTER — Other Ambulatory Visit: Payer: Self-pay | Admitting: Internal Medicine

## 2023-09-10 DIAGNOSIS — M5451 Vertebrogenic low back pain: Secondary | ICD-10-CM | POA: Diagnosis not present

## 2023-09-10 DIAGNOSIS — M549 Dorsalgia, unspecified: Secondary | ICD-10-CM | POA: Diagnosis not present

## 2023-09-10 DIAGNOSIS — M546 Pain in thoracic spine: Secondary | ICD-10-CM | POA: Diagnosis not present

## 2023-09-12 ENCOUNTER — Ambulatory Visit: Payer: Medicare Other | Admitting: Podiatry

## 2023-09-12 ENCOUNTER — Encounter: Payer: Self-pay | Admitting: Podiatry

## 2023-09-12 DIAGNOSIS — B351 Tinea unguium: Secondary | ICD-10-CM

## 2023-09-12 DIAGNOSIS — M79674 Pain in right toe(s): Secondary | ICD-10-CM

## 2023-09-12 DIAGNOSIS — M79675 Pain in left toe(s): Secondary | ICD-10-CM | POA: Diagnosis not present

## 2023-09-12 NOTE — Progress Notes (Signed)
This patient returns to the office for evaluation and treatment of long thick painful nails .  This patient is unable to trim his own nails since the patient cannot reach his feet.  Patient says the nails are painful walking and wearing his shoes.  He returns for preventive foot care services.  General Appearance  Alert, conversant and in no acute stress.  Vascular  Dorsalis pedis and posterior tibial  pulses are palpable  bilaterally.  Capillary return is within normal limits  bilaterally. Temperature is within normal limits  bilaterally.  Neurologic  Senn-Weinstein monofilament wire test within normal limits  bilaterally. Muscle power within normal limits bilaterally.  Nails Thick disfigured discolored nails with subungual debris  from hallux to fifth toes bilaterally. No evidence of bacterial infection or drainage bilaterally.  Orthopedic  No limitations of motion  feet .  No crepitus or effusions noted.  No bony pathology or digital deformities noted.  Skin  normotropic skin with no porokeratosis noted bilaterally.  No signs of infections or ulcers noted.     Onychomycosis  Pain in toes right foot  Pain in toes left foot  Debridement  of nails  1-5  B/L with a nail nipper.  Nails were then filed using a dremel tool with no incidents.    RTC  12 weeks    Leilyn Frayre DPM  

## 2023-09-13 DIAGNOSIS — M546 Pain in thoracic spine: Secondary | ICD-10-CM | POA: Diagnosis not present

## 2023-09-13 DIAGNOSIS — R2689 Other abnormalities of gait and mobility: Secondary | ICD-10-CM | POA: Diagnosis not present

## 2023-09-18 ENCOUNTER — Other Ambulatory Visit: Payer: Self-pay | Admitting: Internal Medicine

## 2023-09-18 NOTE — Telephone Encounter (Signed)
 Prescription refill request for Eliquis received. Indication:afib Last office visit:1/25 Scr:1.04 1/25 Age: 83 Weight:74.4  kg  Prescription refilled

## 2023-09-19 DIAGNOSIS — R2689 Other abnormalities of gait and mobility: Secondary | ICD-10-CM | POA: Diagnosis not present

## 2023-09-19 DIAGNOSIS — M546 Pain in thoracic spine: Secondary | ICD-10-CM | POA: Diagnosis not present

## 2023-09-20 NOTE — Progress Notes (Signed)
 [facility] Summary Report / Loop Recorder

## 2023-09-20 NOTE — Addendum Note (Signed)
 Addended by: Geralyn Flash D on: 09/20/2023 10:54 AM   Modules accepted: Orders

## 2023-09-24 ENCOUNTER — Ambulatory Visit: Payer: Medicare Other

## 2023-09-24 DIAGNOSIS — I480 Paroxysmal atrial fibrillation: Secondary | ICD-10-CM

## 2023-09-24 DIAGNOSIS — R55 Syncope and collapse: Secondary | ICD-10-CM

## 2023-09-25 LAB — CUP PACEART REMOTE DEVICE CHECK
Date Time Interrogation Session: 20250406231842
Implantable Pulse Generator Implant Date: 20240702

## 2023-09-29 ENCOUNTER — Encounter: Payer: Self-pay | Admitting: Internal Medicine

## 2023-10-27 ENCOUNTER — Emergency Department (HOSPITAL_COMMUNITY)

## 2023-10-27 ENCOUNTER — Inpatient Hospital Stay (HOSPITAL_COMMUNITY)
Admission: EM | Admit: 2023-10-27 | Discharge: 2023-10-30 | DRG: 522 | Disposition: A | Discharge status: Skilled Nursing Facility | Attending: Family Medicine | Admitting: Family Medicine

## 2023-10-27 ENCOUNTER — Encounter (HOSPITAL_COMMUNITY): Payer: Self-pay | Admitting: Internal Medicine

## 2023-10-27 ENCOUNTER — Other Ambulatory Visit: Payer: Self-pay

## 2023-10-27 DIAGNOSIS — D62 Acute posthemorrhagic anemia: Secondary | ICD-10-CM | POA: Diagnosis not present

## 2023-10-27 DIAGNOSIS — R9089 Other abnormal findings on diagnostic imaging of central nervous system: Secondary | ICD-10-CM | POA: Diagnosis not present

## 2023-10-27 DIAGNOSIS — S72002A Fracture of unspecified part of neck of left femur, initial encounter for closed fracture: Principal | ICD-10-CM

## 2023-10-27 DIAGNOSIS — K219 Gastro-esophageal reflux disease without esophagitis: Secondary | ICD-10-CM | POA: Diagnosis not present

## 2023-10-27 DIAGNOSIS — S199XXA Unspecified injury of neck, initial encounter: Secondary | ICD-10-CM | POA: Diagnosis not present

## 2023-10-27 DIAGNOSIS — I5042 Chronic combined systolic (congestive) and diastolic (congestive) heart failure: Secondary | ICD-10-CM | POA: Diagnosis present

## 2023-10-27 DIAGNOSIS — Z743 Need for continuous supervision: Secondary | ICD-10-CM | POA: Diagnosis not present

## 2023-10-27 DIAGNOSIS — N179 Acute kidney failure, unspecified: Secondary | ICD-10-CM | POA: Diagnosis not present

## 2023-10-27 DIAGNOSIS — E785 Hyperlipidemia, unspecified: Secondary | ICD-10-CM | POA: Diagnosis present

## 2023-10-27 DIAGNOSIS — I6529 Occlusion and stenosis of unspecified carotid artery: Secondary | ICD-10-CM | POA: Diagnosis not present

## 2023-10-27 DIAGNOSIS — R278 Other lack of coordination: Secondary | ICD-10-CM | POA: Diagnosis not present

## 2023-10-27 DIAGNOSIS — Z860100 Personal history of colon polyps, unspecified: Secondary | ICD-10-CM

## 2023-10-27 DIAGNOSIS — M80052A Age-related osteoporosis with current pathological fracture, left femur, initial encounter for fracture: Secondary | ICD-10-CM | POA: Diagnosis not present

## 2023-10-27 DIAGNOSIS — M810 Age-related osteoporosis without current pathological fracture: Secondary | ICD-10-CM | POA: Diagnosis present

## 2023-10-27 DIAGNOSIS — I428 Other cardiomyopathies: Secondary | ICD-10-CM

## 2023-10-27 DIAGNOSIS — S0990XA Unspecified injury of head, initial encounter: Secondary | ICD-10-CM | POA: Diagnosis present

## 2023-10-27 DIAGNOSIS — R569 Unspecified convulsions: Secondary | ICD-10-CM | POA: Diagnosis not present

## 2023-10-27 DIAGNOSIS — Z8041 Family history of malignant neoplasm of ovary: Secondary | ICD-10-CM

## 2023-10-27 DIAGNOSIS — Y92017 Garden or yard in single-family (private) house as the place of occurrence of the external cause: Secondary | ICD-10-CM | POA: Diagnosis not present

## 2023-10-27 DIAGNOSIS — M8000XA Age-related osteoporosis with current pathological fracture, unspecified site, initial encounter for fracture: Secondary | ICD-10-CM | POA: Diagnosis not present

## 2023-10-27 DIAGNOSIS — Z96642 Presence of left artificial hip joint: Secondary | ICD-10-CM | POA: Diagnosis not present

## 2023-10-27 DIAGNOSIS — R262 Difficulty in walking, not elsewhere classified: Secondary | ICD-10-CM | POA: Diagnosis present

## 2023-10-27 DIAGNOSIS — W010XXA Fall on same level from slipping, tripping and stumbling without subsequent striking against object, initial encounter: Secondary | ICD-10-CM | POA: Diagnosis present

## 2023-10-27 DIAGNOSIS — I4719 Other supraventricular tachycardia: Secondary | ICD-10-CM | POA: Diagnosis not present

## 2023-10-27 DIAGNOSIS — R000 Tachycardia, unspecified: Secondary | ICD-10-CM | POA: Diagnosis not present

## 2023-10-27 DIAGNOSIS — S72012A Unspecified intracapsular fracture of left femur, initial encounter for closed fracture: Secondary | ICD-10-CM | POA: Diagnosis not present

## 2023-10-27 DIAGNOSIS — Z825 Family history of asthma and other chronic lower respiratory diseases: Secondary | ICD-10-CM | POA: Diagnosis not present

## 2023-10-27 DIAGNOSIS — M6281 Muscle weakness (generalized): Secondary | ICD-10-CM | POA: Diagnosis not present

## 2023-10-27 DIAGNOSIS — R609 Edema, unspecified: Secondary | ICD-10-CM | POA: Diagnosis not present

## 2023-10-27 DIAGNOSIS — W19XXXA Unspecified fall, initial encounter: Secondary | ICD-10-CM | POA: Diagnosis not present

## 2023-10-27 DIAGNOSIS — I4891 Unspecified atrial fibrillation: Secondary | ICD-10-CM | POA: Diagnosis not present

## 2023-10-27 DIAGNOSIS — R55 Syncope and collapse: Secondary | ICD-10-CM | POA: Diagnosis not present

## 2023-10-27 DIAGNOSIS — I2510 Atherosclerotic heart disease of native coronary artery without angina pectoris: Secondary | ICD-10-CM | POA: Diagnosis not present

## 2023-10-27 DIAGNOSIS — Z950 Presence of cardiac pacemaker: Secondary | ICD-10-CM

## 2023-10-27 DIAGNOSIS — S7292XD Unspecified fracture of left femur, subsequent encounter for closed fracture with routine healing: Secondary | ICD-10-CM | POA: Diagnosis not present

## 2023-10-27 DIAGNOSIS — I472 Ventricular tachycardia, unspecified: Secondary | ICD-10-CM | POA: Diagnosis not present

## 2023-10-27 DIAGNOSIS — S72042A Displaced fracture of base of neck of left femur, initial encounter for closed fracture: Secondary | ICD-10-CM | POA: Diagnosis not present

## 2023-10-27 DIAGNOSIS — Z87442 Personal history of urinary calculi: Secondary | ICD-10-CM | POA: Diagnosis not present

## 2023-10-27 DIAGNOSIS — I509 Heart failure, unspecified: Secondary | ICD-10-CM | POA: Diagnosis not present

## 2023-10-27 DIAGNOSIS — G40909 Epilepsy, unspecified, not intractable, without status epilepticus: Secondary | ICD-10-CM | POA: Diagnosis present

## 2023-10-27 DIAGNOSIS — Y93H2 Activity, gardening and landscaping: Secondary | ICD-10-CM | POA: Diagnosis not present

## 2023-10-27 DIAGNOSIS — I480 Paroxysmal atrial fibrillation: Secondary | ICD-10-CM | POA: Diagnosis not present

## 2023-10-27 DIAGNOSIS — Z7901 Long term (current) use of anticoagulants: Secondary | ICD-10-CM | POA: Diagnosis not present

## 2023-10-27 DIAGNOSIS — Z885 Allergy status to narcotic agent status: Secondary | ICD-10-CM | POA: Diagnosis not present

## 2023-10-27 DIAGNOSIS — I10 Essential (primary) hypertension: Secondary | ICD-10-CM | POA: Diagnosis not present

## 2023-10-27 DIAGNOSIS — S7292XA Unspecified fracture of left femur, initial encounter for closed fracture: Secondary | ICD-10-CM | POA: Diagnosis present

## 2023-10-27 DIAGNOSIS — I493 Ventricular premature depolarization: Secondary | ICD-10-CM | POA: Diagnosis not present

## 2023-10-27 DIAGNOSIS — Z471 Aftercare following joint replacement surgery: Secondary | ICD-10-CM | POA: Diagnosis not present

## 2023-10-27 DIAGNOSIS — S79929A Unspecified injury of unspecified thigh, initial encounter: Secondary | ICD-10-CM | POA: Diagnosis not present

## 2023-10-27 DIAGNOSIS — Z79899 Other long term (current) drug therapy: Secondary | ICD-10-CM | POA: Diagnosis not present

## 2023-10-27 DIAGNOSIS — Z7401 Bed confinement status: Secondary | ICD-10-CM | POA: Diagnosis not present

## 2023-10-27 DIAGNOSIS — H8111 Benign paroxysmal vertigo, right ear: Secondary | ICD-10-CM | POA: Diagnosis not present

## 2023-10-27 DIAGNOSIS — R531 Weakness: Secondary | ICD-10-CM | POA: Diagnosis not present

## 2023-10-27 LAB — BASIC METABOLIC PANEL WITH GFR
Anion gap: 11 (ref 5–15)
BUN: 24 mg/dL — ABNORMAL HIGH (ref 8–23)
CO2: 19 mmol/L — ABNORMAL LOW (ref 22–32)
Calcium: 9 mg/dL (ref 8.9–10.3)
Chloride: 105 mmol/L (ref 98–111)
Creatinine, Ser: 1.1 mg/dL (ref 0.61–1.24)
GFR, Estimated: >60 mL/min (ref >60)
Glucose, Bld: 116 mg/dL — ABNORMAL HIGH (ref 70–99)
Potassium: 4.4 mmol/L (ref 3.5–5.1)
Sodium: 135 mmol/L (ref 135–145)

## 2023-10-27 LAB — CBC
HCT: 42.6 % (ref 39.0–52.0)
Hemoglobin: 14.6 g/dL (ref 13.0–17.0)
MCH: 32.5 pg (ref 26.0–34.0)
MCHC: 34.3 g/dL (ref 30.0–36.0)
MCV: 94.9 fL (ref 80.0–100.0)
Platelets: 133 10*3/uL — ABNORMAL LOW (ref 150–400)
RBC: 4.49 MIL/uL (ref 4.22–5.81)
RDW: 13.3 % (ref 11.5–15.5)
WBC: 7.4 10*3/uL (ref 4.0–10.5)
nRBC: 0 % (ref 0.0–0.2)

## 2023-10-27 MED ORDER — HEPARIN SODIUM (PORCINE) 5000 UNIT/ML IJ SOLN
5000.0000 [IU] | Freq: Three times a day (TID) | INTRAMUSCULAR | Status: DC
  Filled 2023-10-27: qty 1

## 2023-10-27 MED ORDER — SENNOSIDES-DOCUSATE SODIUM 8.6-50 MG PO TABS: 1.0000 | ORAL_TABLET | Freq: Every evening | ORAL | Status: DC | PRN

## 2023-10-27 MED ORDER — SODIUM CHLORIDE 0.9 % IV BOLUS
500.0000 mL | Freq: Once | INTRAVENOUS | Status: AC
  Administered 2023-10-27: 500 mL via INTRAVENOUS

## 2023-10-27 MED ORDER — HYDROMORPHONE HCL 1 MG/ML IJ SOLN
1.0000 mg | INTRAMUSCULAR | Status: DC | PRN
  Administered 2023-10-27: 1 mg via INTRAVENOUS
  Filled 2023-10-27: qty 1

## 2023-10-27 MED ORDER — HYDROMORPHONE HCL 1 MG/ML IJ SOLN
1.0000 mg | Freq: Once | INTRAMUSCULAR | Status: AC
  Administered 2023-10-27: 1 mg via INTRAVENOUS
  Filled 2023-10-27: qty 1

## 2023-10-27 MED ORDER — DIVALPROEX SODIUM ER 500 MG PO TB24
500.0000 mg | ORAL_TABLET | Freq: Two times a day (BID) | ORAL | Status: DC
  Administered 2023-10-27 – 2023-10-30 (×5): 500 mg via ORAL
  Filled 2023-10-27 (×5): qty 1

## 2023-10-27 MED ORDER — OXYCODONE HCL 5 MG PO TABS: 5.0000 mg | ORAL_TABLET | ORAL | Status: DC | PRN

## 2023-10-27 MED ORDER — FUROSEMIDE 40 MG PO TABS
40.0000 mg | ORAL_TABLET | Freq: Every day | ORAL | Status: DC
  Administered 2023-10-29 – 2023-10-30 (×2): 40 mg via ORAL
  Filled 2023-10-27 (×2): qty 1
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
## 2023-10-27 NOTE — ED Triage Notes (Signed)
 Bib by EMS from home for left hip injury  and R/T fall. Patient was gardening and tripped on the rake and fell on left side. Denies LOC, did not hit head but takes Eliquis  for Afib. Patient appears awake, alert and oriented. Breathing even and unlabored. Hard of hearing.   EMS started an IV on left forearm g 20. Fentanyl  100 mcg IV given.

## 2023-10-27 NOTE — ED Provider Notes (Signed)
 [facility] Provider Note   CSN: [id number] Arrival date & time: 10/27/23  1143     History  Chief Complaint  Patient presents with   Hip Injury    Gary Atkins is a 83 y.o. male with a history of CAD, CHF, atrial fibrillation, and seizure disorder presents the ED today after a fall.  Patient reports he was going outside to start gardening when he tripped over a rake and fell on the left side.  Denies loss of consciousness but did hit his head.  He is on Eliquis .  Patient reports pain to the left hip, worse with movement.  He received fentanyl  via EMS with improvement of pain.  No new weakness, vision changes, headaches, or confusion.  No additional complaints or concerns at this time.    Home Medications Prior to Admission medications   Medication Sig Start Date End Date Taking? Authorizing Provider  apixaban  (ELIQUIS ) 5 MG TABS tablet Take 1 tablet by mouth twice daily 09/18/23   Tammie Fall, MD  atorvastatin  (LIPITOR) 20 MG tablet Take 1 tablet by mouth once daily 03/05/23   Roslyn Coombe, MD  augmented betamethasone dipropionate (DIPROLENE-AF) 0.05 % cream Apply 1 application  topically 2 (two) times daily as needed (irritation). 11/18/21   [provider]  Cyanocobalamin  (B-12 PO) Take 2 tablets by mouth daily.    [provider]  divalproex  (DEPAKOTE  ER) 500 MG 24 hr tablet Take 1 tablet by mouth twice daily 04/16/23   Roslyn Coombe, MD  furosemide  (LASIX ) 40 MG tablet Take 1 tablet (40 mg total) by mouth daily. 01/29/23   Sheryle Donning, MD  latanoprost (XALATAN) 0.005 % ophthalmic solution Place 1 drop into both eyes at bedtime. 04/07/20   [provider]  lisinopril  (ZESTRIL ) 5 MG tablet Take 0.5 tablets (2.5 mg total) by mouth at bedtime. 09/12/23   Sheryle Donning, MD  Multiple Vitamins-Minerals (PRESERVISION AREDS PO) Take 1 tablet by mouth daily.    [provider]  mupirocin  ointment (BACTROBAN) 2 % Apply 1 Application topically 3 (three) times daily as needed (wound care). 08/02/21   [provider]  NON FORMULARY Apply 1 application  topically daily as needed (nail fungus). Antifungal toenail solution from [place], faxed on 01/14/2019    [provider]  traMADol  (ULTRAM ) 50 MG tablet TAKE 1 TABLET BY MOUTH EVERY 6 HOURS AS NEEDED 07/23/23   Roslyn Coombe, MD  zinc gluconate 50 MG tablet Take 50 mg by mouth daily.    [provider]      Allergies    Demerol [meperidine]    Review of Systems   Review of Systems  Musculoskeletal:        Hip pain  All other systems reviewed and are negative.   Physical Exam Updated Vital Signs BP 131/80 (BP Location: Right Arm)   Pulse 86   Temp 98 F (36.7 C) (Oral)   Resp 18   Ht 5\' 9"  (1.753 m)   Wt 74.8 kg   SpO2 100%   BMI 24.37 kg/m  Physical Exam Vitals and nursing note reviewed.  Constitutional:      General: He is not in acute distress.    Appearance: Normal appearance.  HENT:     Head: Normocephalic and atraumatic.     Comments: No hematoma to the head, battle sign, or raccoon eyes    Mouth/Throat:     Mouth: Mucous membranes are  moist.  Eyes:     Conjunctiva/sclera: Conjunctivae normal.     Pupils: Pupils are equal, round, and reactive to light.  Cardiovascular:     Rate and Rhythm: Normal rate and regular rhythm.     Pulses: Normal pulses.     Heart sounds: Normal heart sounds.  Pulmonary:     Effort: Pulmonary effort is normal.     Breath sounds: Normal breath sounds.  Abdominal:     Palpations: Abdomen is soft.     Tenderness: There is no abdominal tenderness.  Musculoskeletal:        General: Tenderness present. No swelling or deformity.     Cervical back: Normal range of motion.     Comments: Tenderness to palpation of the left anterior hip with impaired range of motion.  Patient is neurovascularly intact.  Skin:    General: Skin is warm and dry.      Findings: No rash.  Neurological:     General: No focal deficit present.     Mental Status: He is alert.     Sensory: No sensory deficit.     Motor: No weakness.  Psychiatric:        Mood and Affect: Mood normal.        Behavior: Behavior normal.     ED Results / Procedures / Treatments   Labs (all labs ordered are listed, but only abnormal results are displayed) Labs Reviewed  CBC - Abnormal; Notable for the following components:      Result Value   Platelets 133 (*)    All other components within normal limits  BASIC METABOLIC PANEL WITH GFR - Abnormal; Notable for the following components:   CO2 19 (*)    Glucose, Bld 116 (*)    BUN 24 (*)    All other components within normal limits    EKG None  Radiology DG Hip Unilat W or Wo Pelvis 2-3 Views Left Result Date: 10/27/2023 CLINICAL DATA:  Status post fall. EXAM: DG HIP (WITH OR WITHOUT PELVIS) 2-3V LEFT COMPARISON:  None Available. FINDINGS: There is an acute, subcapital left femoral neck fracture. The proximal displacement of the distal fracture fragments noted. No dislocation. Right hip appears intact. No signs of pelvic fracture or diastasis. IMPRESSION: Acute, displaced subcapital left femoral neck fracture. Electronically Signed   By: Kimberley Penman M.D.   On: 10/27/2023 14:08   CT Head Wo Contrast Result Date: 10/27/2023 CLINICAL DATA:  Trauma, trip and fall while gardening. On anticoagulation for atrial fibrillation. EXAM: CT HEAD WITHOUT CONTRAST CT CERVICAL SPINE WITHOUT CONTRAST TECHNIQUE: Multidetector CT imaging of the head and cervical spine was performed following the standard protocol without intravenous contrast. Multiplanar CT image reconstructions of the cervical spine were also generated. RADIATION DOSE REDUCTION: This exam was performed according to the departmental dose-optimization program which includes automated exposure control, adjustment of the mA and/or kV according to patient size and/or use of  iterative reconstruction technique. COMPARISON:  05/14/2022. FINDINGS: CT HEAD FINDINGS Brain: No acute intracranial hemorrhage. No CT evidence of acute infarct. No edema, mass effect, or midline shift. The basilar cisterns are patent. Ventricles: Prominence of the ventricles suggesting underlying parenchymal volume loss. Vascular: Atherosclerotic calcifications of the carotid siphons. Mild focal atherosclerosis of the right M1 segment is slightly increased from prior. No hyperdense vessel. Skull: No acute or aggressive finding. Orbits: Orbits are symmetric. Sinuses: Mucosal thickening in the ethmoid sinuses, slightly greater on the right. Other: Mastoid air cells are clear. CT CERVICAL  SPINE FINDINGS Alignment: Alignment is maintained. No listhesis. No facet subluxation or dislocation. Skull base and vertebrae: No acute fracture. No primary bone lesion or focal pathologic process. Soft tissues and spinal canal: No prevertebral fluid or swelling. No visible canal hematoma. Disc levels: Moderate disc space narrowing at multiple levels. Severe disc space narrowing posteriorly at C3-4. Disc osteophyte complexes at multiple levels in the cervical spine without high-grade osseous spinal canal stenosis. Facet arthrosis and uncovertebral hypertrophy at multiple levels. Upper chest: Negative. Other: None. IMPRESSION: No CT evidence of acute intracranial abnormality. No acute fracture or traumatic malalignment of the cervical spine. Generalized parenchymal volume loss. Degenerative changes of the cervical spine as above. Electronically Signed   By: Denny Flack M.D.   On: 10/27/2023 13:54   CT Cervical Spine Wo Contrast Result Date: 10/27/2023 CLINICAL DATA:  Trauma, trip and fall while gardening. On anticoagulation for atrial fibrillation. EXAM: CT HEAD WITHOUT CONTRAST CT CERVICAL SPINE WITHOUT CONTRAST TECHNIQUE: Multidetector CT imaging of the head and cervical spine was performed following the standard protocol  without intravenous contrast. Multiplanar CT image reconstructions of the cervical spine were also generated. RADIATION DOSE REDUCTION: This exam was performed according to the departmental dose-optimization program which includes automated exposure control, adjustment of the mA and/or kV according to patient size and/or use of iterative reconstruction technique. COMPARISON:  05/14/2022. FINDINGS: CT HEAD FINDINGS Brain: No acute intracranial hemorrhage. No CT evidence of acute infarct. No edema, mass effect, or midline shift. The basilar cisterns are patent. Ventricles: Prominence of the ventricles suggesting underlying parenchymal volume loss. Vascular: Atherosclerotic calcifications of the carotid siphons. Mild focal atherosclerosis of the right M1 segment is slightly increased from prior. No hyperdense vessel. Skull: No acute or aggressive finding. Orbits: Orbits are symmetric. Sinuses: Mucosal thickening in the ethmoid sinuses, slightly greater on the right. Other: Mastoid air cells are clear. CT CERVICAL SPINE FINDINGS Alignment: Alignment is maintained. No listhesis. No facet subluxation or dislocation. Skull base and vertebrae: No acute fracture. No primary bone lesion or focal pathologic process. Soft tissues and spinal canal: No prevertebral fluid or swelling. No visible canal hematoma. Disc levels: Moderate disc space narrowing at multiple levels. Severe disc space narrowing posteriorly at C3-4. Disc osteophyte complexes at multiple levels in the cervical spine without high-grade osseous spinal canal stenosis. Facet arthrosis and uncovertebral hypertrophy at multiple levels. Upper chest: Negative. Other: None. IMPRESSION: No CT evidence of acute intracranial abnormality. No acute fracture or traumatic malalignment of the cervical spine. Generalized parenchymal volume loss. Degenerative changes of the cervical spine as above. Electronically Signed   By: Denny Flack M.D.   On: 10/27/2023 13:54     Procedures Procedures    Medications Ordered in ED Medications  sodium chloride  0.9 % bolus 500 mL (has no administration in time range)  senna-docusate (Senokot-S) tablet 1 tablet (has no administration in time range)  HYDROmorphone (DILAUDID) injection 1 mg (1 mg Intravenous Given 10/27/23 1328)    ED Course/ Medical Decision Making/ A&P                                 Medical Decision Making Amount and/or Complexity of Data Reviewed Labs: ordered. Radiology: ordered. ECG/medicine tests: ordered.  Risk Prescription drug management.   This patient presents to the ED for concern of hip pain, this involves an extensive number of treatment options, and is a complaint that carries with it a high risk  of complications and morbidity.   Differential diagnosis includes: fracture, dislocation, contusion, abrasion, muscle strain, ligamentous injury, etc.   Comorbidities  See HPI above   Additional History  Additional history obtained from prior records   Lab Tests  I ordered and personally interpreted labs.  The pertinent results include:   CBC is reassuring Elevated BUN of 24 on CMP otherwise reassuring   Imaging Studies  I ordered imaging studies including CT head and cervical spine, left hip with pelvis x-ray  I independently visualized and interpreted imaging which showed:  No CT evidence of acute intracranial normality. No acute fracture or traumatic malalignment of the cervical spine. Acute, displaced subcapital left femoral neck fracture I agree with the radiologist interpretation   Consultations  I requested consultation with Dr. Agatha Horsfall with orthopedics,  and discussed lab and imaging findings as well as pertinent plan - they recommend: patient will need surgery tomorrow I requested consultation with Dr. Rudine Cos with [facility],  and discussed lab and imaging findings as well as pertinent plan - they recommend: admission   Problem List / ED Course /  Critical Interventions / Medication Management  Patient was going outside to start guarding earlier today when he tripped over a rake and fell on his left side, hitting his left hip.  He did hit his head as well but denies LOC.  He is on Eliquis  for atrial fibrillation.  No neurologic deficits.  Limited ROM to the left hip secondary pain but otherwise left leg is neurovascularly intact. Last ate at 9 AM this morning. Last dose of Eliquis  was last night. I ordered medications including: Dilaudid for pain NS for elevated BUN Reevaluation of the patient after these medicines showed that the patient improved I have reviewed the patients home medicines and have made adjustments as needed   Social Determinants of Health  Physical activity   Test / Admission - Considered  Discussed plans with patient and wife at bedside.  All questions were answered. They are agreeable with plan for admission.       Final Clinical Impression(s) / ED Diagnoses Final diagnoses:  Closed fracture of neck of left femur, initial encounter [facility])    Rx / DC Orders ED Discharge Orders     None         Sonnie Dusky, PA-C 10/27/23 1424    Rolinda Climes, DO 10/27/23 1437

## 2023-10-28 ENCOUNTER — Inpatient Hospital Stay (HOSPITAL_COMMUNITY): Admitting: Anesthesiology

## 2023-10-28 ENCOUNTER — Encounter (HOSPITAL_COMMUNITY)
Admission: EM | Disposition: A | Discharge status: Skilled Nursing Facility | Payer: Self-pay | Source: Home / Self Care | Attending: Family Medicine

## 2023-10-28 ENCOUNTER — Inpatient Hospital Stay (HOSPITAL_COMMUNITY)

## 2023-10-28 DIAGNOSIS — I509 Heart failure, unspecified: Secondary | ICD-10-CM

## 2023-10-28 DIAGNOSIS — S72002A Fracture of unspecified part of neck of left femur, initial encounter for closed fracture: Secondary | ICD-10-CM

## 2023-10-28 DIAGNOSIS — I2510 Atherosclerotic heart disease of native coronary artery without angina pectoris: Secondary | ICD-10-CM

## 2023-10-28 DIAGNOSIS — E785 Hyperlipidemia, unspecified: Secondary | ICD-10-CM

## 2023-10-28 DIAGNOSIS — S72012A Unspecified intracapsular fracture of left femur, initial encounter for closed fracture: Secondary | ICD-10-CM | POA: Diagnosis not present

## 2023-10-28 DIAGNOSIS — I10 Essential (primary) hypertension: Secondary | ICD-10-CM | POA: Diagnosis not present

## 2023-10-28 HISTORY — PX: HIP ARTHROPLASTY: SHX981

## 2023-10-28 LAB — CBC
HCT: 40.8 % (ref 39.0–52.0)
Hemoglobin: 13.5 g/dL (ref 13.0–17.0)
MCH: 32.7 pg (ref 26.0–34.0)
MCHC: 33.1 g/dL (ref 30.0–36.0)
MCV: 98.8 fL (ref 80.0–100.0)
Platelets: 122 10*3/uL — ABNORMAL LOW (ref 150–400)
RBC: 4.13 MIL/uL — ABNORMAL LOW (ref 4.22–5.81)
RDW: 13.4 % (ref 11.5–15.5)
WBC: 7.3 10*3/uL (ref 4.0–10.5)
nRBC: 0 % (ref 0.0–0.2)

## 2023-10-28 LAB — BASIC METABOLIC PANEL WITH GFR
Anion gap: 9 (ref 5–15)
BUN: 25 mg/dL — ABNORMAL HIGH (ref 8–23)
CO2: 22 mmol/L (ref 22–32)
Calcium: 8.7 mg/dL — ABNORMAL LOW (ref 8.9–10.3)
Chloride: 105 mmol/L (ref 98–111)
Creatinine, Ser: 1.15 mg/dL (ref 0.61–1.24)
GFR, Estimated: >60 mL/min (ref >60)
Glucose, Bld: 154 mg/dL — ABNORMAL HIGH (ref 70–99)
Potassium: 4.2 mmol/L (ref 3.5–5.1)
Sodium: 136 mmol/L (ref 135–145)

## 2023-10-28 SURGERY — HEMIARTHROPLASTY (BIPOLAR) HIP, POSTERIOR APPROACH FOR FRACTURE
Anesthesia: General | Site: Hip | Laterality: Left

## 2023-10-28 MED ORDER — HYDROMORPHONE HCL 1 MG/ML IJ SOLN
0.2500 mg | INTRAMUSCULAR | Status: DC | PRN
  Administered 2023-10-28 (×2): 0.5 mg via INTRAVENOUS

## 2023-10-28 MED ORDER — HYDROCODONE-ACETAMINOPHEN 7.5-325 MG PO TABS
1.0000 | ORAL_TABLET | ORAL | Status: DC | PRN
  Administered 2023-10-29: 1 via ORAL
  Filled 2023-10-28: qty 1

## 2023-10-28 MED ORDER — CEFAZOLIN SODIUM-DEXTROSE 2-4 GM/100ML-% IV SOLN
INTRAVENOUS | Status: AC
  Filled 2023-10-28: qty 100

## 2023-10-28 MED ORDER — CEFAZOLIN SODIUM-DEXTROSE 2-3 GM-%(50ML) IV SOLR
INTRAVENOUS | Status: DC | PRN
  Administered 2023-10-28: 2 g via INTRAVENOUS

## 2023-10-28 MED ORDER — FERROUS SULFATE 325 (65 FE) MG PO TABS
325.0000 mg | ORAL_TABLET | Freq: Three times a day (TID) | ORAL | Status: DC
  Administered 2023-10-28 – 2023-10-30 (×6): 325 mg via ORAL
  Filled 2023-10-28 (×6): qty 1

## 2023-10-28 MED ORDER — PHENOL 1.4 % MT LIQD: 1.0000 | OROMUCOSAL | Status: DC | PRN

## 2023-10-28 MED ORDER — ACETAMINOPHEN 325 MG PO TABS
325.0000 mg | ORAL_TABLET | Freq: Four times a day (QID) | ORAL | Status: DC | PRN
  Administered 2023-10-30: 650 mg via ORAL
  Filled 2023-10-28: qty 2

## 2023-10-28 MED ORDER — FENTANYL CITRATE (PF) 250 MCG/5ML IJ SOLN
INTRAMUSCULAR | Status: AC
  Filled 2023-10-28: qty 5

## 2023-10-28 MED ORDER — 0.9 % SODIUM CHLORIDE (POUR BTL) OPTIME
TOPICAL | Status: DC | PRN
  Administered 2023-10-28: 1000 mL

## 2023-10-28 MED ORDER — PROPOFOL 10 MG/ML IV BOLUS
INTRAVENOUS | Status: DC | PRN
  Administered 2023-10-28: 120 mg via INTRAVENOUS

## 2023-10-28 MED ORDER — ACETAMINOPHEN 10 MG/ML IV SOLN
INTRAVENOUS | Status: DC | PRN
  Administered 2023-10-28: 1000 mg via INTRAVENOUS

## 2023-10-28 MED ORDER — HYDROMORPHONE HCL 1 MG/ML IJ SOLN
INTRAMUSCULAR | Status: AC
  Filled 2023-10-28: qty 2

## 2023-10-28 MED ORDER — PHENYLEPHRINE HCL-NACL 20-0.9 MG/250ML-% IV SOLN
INTRAVENOUS | Status: DC | PRN
  Administered 2023-10-28: 35 ug/min via INTRAVENOUS

## 2023-10-28 MED ORDER — HYDROCODONE-ACETAMINOPHEN 5-325 MG PO TABS
1.0000 | ORAL_TABLET | ORAL | Status: DC | PRN
  Administered 2023-10-29: 1 via ORAL
  Filled 2023-10-28 (×2): qty 1

## 2023-10-28 MED ORDER — FENTANYL CITRATE (PF) 250 MCG/5ML IJ SOLN
INTRAMUSCULAR | Status: DC | PRN
  Administered 2023-10-28 (×3): 50 ug via INTRAVENOUS

## 2023-10-28 MED ORDER — LACTATED RINGERS IV SOLN: INTRAVENOUS | Status: DC | PRN

## 2023-10-28 MED ORDER — PHENYLEPHRINE 80 MCG/ML (10ML) SYRINGE FOR IV PUSH (FOR BLOOD PRESSURE SUPPORT)
PREFILLED_SYRINGE | INTRAVENOUS | Status: DC | PRN
  Administered 2023-10-28 (×2): 80 ug via INTRAVENOUS

## 2023-10-28 MED ORDER — CHLORHEXIDINE GLUCONATE 4 % EX SOLN: 60.0000 mL | Freq: Once | CUTANEOUS | Status: DC

## 2023-10-28 MED ORDER — ROCURONIUM BROMIDE 10 MG/ML (PF) SYRINGE
PREFILLED_SYRINGE | INTRAVENOUS | Status: DC | PRN
  Administered 2023-10-28: 10 mg via INTRAVENOUS
  Administered 2023-10-28: 50 mg via INTRAVENOUS

## 2023-10-28 MED ORDER — POVIDONE-IODINE 10 % EX SWAB: 2.0000 | Freq: Once | CUTANEOUS | Status: DC

## 2023-10-28 MED ORDER — MORPHINE SULFATE (PF) 2 MG/ML IV SOLN: 0.5000 mg | INTRAVENOUS | Status: DC | PRN

## 2023-10-28 MED ORDER — CEFAZOLIN SODIUM-DEXTROSE 2-4 GM/100ML-% IV SOLN
2.0000 g | Freq: Four times a day (QID) | INTRAVENOUS | Status: AC
  Administered 2023-10-28 (×2): 2 g via INTRAVENOUS
  Filled 2023-10-28 (×2): qty 100

## 2023-10-28 MED ORDER — DOCUSATE SODIUM 100 MG PO CAPS
100.0000 mg | ORAL_CAPSULE | Freq: Two times a day (BID) | ORAL | Status: DC
  Administered 2023-10-28 – 2023-10-30 (×4): 100 mg via ORAL
  Filled 2023-10-28 (×4): qty 1

## 2023-10-28 MED ORDER — DEXAMETHASONE SODIUM PHOSPHATE 10 MG/ML IJ SOLN
INTRAMUSCULAR | Status: DC | PRN
  Administered 2023-10-28: 10 mg via INTRAVENOUS

## 2023-10-28 MED ORDER — BISACODYL 10 MG RE SUPP: 10.0000 mg | Freq: Every day | RECTAL | Status: DC | PRN

## 2023-10-28 MED ORDER — DROPERIDOL 2.5 MG/ML IJ SOLN: 0.6250 mg | Freq: Once | INTRAMUSCULAR | Status: DC | PRN

## 2023-10-28 MED ORDER — ALUM & MAG HYDROXIDE-SIMETH 200-200-20 MG/5ML PO SUSP: 30.0000 mL | ORAL | Status: DC | PRN

## 2023-10-28 MED ORDER — ONDANSETRON HCL 4 MG/2ML IJ SOLN
INTRAMUSCULAR | Status: DC | PRN
  Administered 2023-10-28: 4 mg via INTRAVENOUS

## 2023-10-28 MED ORDER — SENNA 8.6 MG PO TABS
1.0000 | ORAL_TABLET | Freq: Two times a day (BID) | ORAL | Status: DC
Start: 2023-10-28 — End: 2023-10-30
  Administered 2023-10-28 – 2023-10-30 (×3): 8.6 mg via ORAL
  Filled 2023-10-28 (×3): qty 1

## 2023-10-28 MED ORDER — APIXABAN 5 MG PO TABS
5.0000 mg | ORAL_TABLET | Freq: Two times a day (BID) | ORAL | Status: DC
  Administered 2023-10-29 – 2023-10-30 (×3): 5 mg via ORAL
  Filled 2023-10-28 (×4): qty 1

## 2023-10-28 MED ORDER — MENTHOL 3 MG MT LOZG: 1.0000 | LOZENGE | OROMUCOSAL | Status: DC | PRN

## 2023-10-28 MED ORDER — TRANEXAMIC ACID-NACL 1000-0.7 MG/100ML-% IV SOLN
1000.0000 mg | Freq: Once | INTRAVENOUS | Status: AC
  Administered 2023-10-28: 1000 mg via INTRAVENOUS
  Filled 2023-10-28: qty 100

## 2023-10-28 MED ORDER — ACETAMINOPHEN 10 MG/ML IV SOLN
INTRAVENOUS | Status: AC
  Filled 2023-10-28: qty 100

## 2023-10-28 MED ORDER — BUPIVACAINE HCL (PF) 0.25 % IJ SOLN
INTRAMUSCULAR | Status: DC | PRN
  Administered 2023-10-28: 30 mL

## 2023-10-28 MED ORDER — ACETAMINOPHEN 500 MG PO TABS
500.0000 mg | ORAL_TABLET | Freq: Four times a day (QID) | ORAL | Status: AC
  Administered 2023-10-28 – 2023-10-29 (×3): 500 mg via ORAL
  Filled 2023-10-28 (×3): qty 1

## 2023-10-28 MED ORDER — MAGNESIUM CITRATE PO SOLN: 1.0000 | Freq: Once | ORAL | Status: DC | PRN

## 2023-10-28 MED ORDER — KETOROLAC TROMETHAMINE 30 MG/ML IJ SOLN
INTRAMUSCULAR | Status: DC | PRN
  Administered 2023-10-28: 30 mg via INTRAMUSCULAR

## 2023-10-28 MED ORDER — KETOROLAC TROMETHAMINE 30 MG/ML IJ SOLN
INTRAMUSCULAR | Status: AC
  Filled 2023-10-28: qty 1

## 2023-10-28 MED ORDER — ONDANSETRON HCL 4 MG/2ML IJ SOLN: 4.0000 mg | Freq: Four times a day (QID) | INTRAMUSCULAR | Status: DC | PRN

## 2023-10-28 MED ORDER — BUPIVACAINE-EPINEPHRINE (PF) 0.25% -1:200000 IJ SOLN
INTRAMUSCULAR | Status: AC
  Filled 2023-10-28: qty 30

## 2023-10-28 MED ORDER — ALBUMIN HUMAN 5 % IV SOLN
INTRAVENOUS | Status: AC
  Administered 2023-10-28: 12.5 g
  Filled 2023-10-28: qty 250

## 2023-10-28 MED ORDER — ONDANSETRON HCL 4 MG PO TABS: 4.0000 mg | ORAL_TABLET | Freq: Four times a day (QID) | ORAL | Status: DC | PRN

## 2023-10-28 MED ORDER — CEFAZOLIN SODIUM-DEXTROSE 2-4 GM/100ML-% IV SOLN: 2.0000 g | INTRAVENOUS | Status: DC

## 2023-10-28 MED ORDER — SUGAMMADEX SODIUM 200 MG/2ML IV SOLN
INTRAVENOUS | Status: DC | PRN
  Administered 2023-10-28: 200 mg via INTRAVENOUS

## 2023-10-28 MED ORDER — POTASSIUM CHLORIDE IN NACL 20-0.9 MEQ/L-% IV SOLN
INTRAVENOUS | Status: DC
Start: 2023-10-28 — End: 2023-10-29
  Filled 2023-10-28: qty 1000

## 2023-10-28 MED ORDER — LIDOCAINE 2% (20 MG/ML) 5 ML SYRINGE
INTRAMUSCULAR | Status: DC | PRN
  Administered 2023-10-28: 70 mg via INTRAVENOUS

## 2023-10-28 MED ORDER — STERILE WATER FOR IRRIGATION IR SOLN
Status: DC | PRN
  Administered 2023-10-28: 2000 mL

## 2023-10-28 MED ORDER — POLYETHYLENE GLYCOL 3350 17 G PO PACK: 17.0000 g | PACK | Freq: Every day | ORAL | Status: DC | PRN

## 2023-10-28 SURGICAL SUPPLY — 50 items
ANCHOR SUT KEITH ABD SZ2 STR (SUTURE) ×1 IMPLANT
BAG COUNTER SPONGE SURGICOUNT (BAG) IMPLANT
BIT DRILL 2.0X128 (BIT) ×1 IMPLANT
BLADE SAW SGTL 73X25 THK (BLADE) ×1 IMPLANT
CLSR STERI-STRIP ANTIMIC 1/2X4 (GAUZE/BANDAGES/DRESSINGS) ×2 IMPLANT
COVER SURGICAL LIGHT HANDLE (MISCELLANEOUS) ×1 IMPLANT
DERMABOND ADVANCED .7 DNX12 (GAUZE/BANDAGES/DRESSINGS) IMPLANT
DRAPE INCISE IOBAN 66X45 STRL (DRAPES) ×1 IMPLANT
DRAPE POUCH INSTRU U-SHP 10X18 (DRAPES) ×1 IMPLANT
DRAPE SHEET LG 3/4 BI-LAMINATE (DRAPES) ×1 IMPLANT
DRAPE SURG 17X11 SM STRL (DRAPES) ×1 IMPLANT
DRAPE SURG ORHT 6 SPLT 77X108 (DRAPES) ×2 IMPLANT
DRAPE U-SHAPE 47X51 STRL (DRAPES) ×1 IMPLANT
DRSG MEPILEX POST OP 4X12 (GAUZE/BANDAGES/DRESSINGS) ×1 IMPLANT
DURAPREP 26ML APPLICATOR (WOUND CARE) ×2 IMPLANT
ELECT BLADE TIP CTD 4 INCH (ELECTRODE) ×1 IMPLANT
ELECT PENCIL ROCKER SW 15FT (MISCELLANEOUS) ×1 IMPLANT
ELECT REM PT RETURN 15FT ADLT (MISCELLANEOUS) ×1 IMPLANT
FACESHIELD WRAPAROUND (MASK) ×1 IMPLANT
FACESHIELD WRAPAROUND OR TEAM (MASK) ×1 IMPLANT
GLOVE BIO SURGEON STRL SZ 6.5 (GLOVE) ×1 IMPLANT
GLOVE BIO SURGEON STRL SZ7.5 (GLOVE) ×1 IMPLANT
GLOVE BIOGEL PI IND STRL 7.0 (GLOVE) ×1 IMPLANT
GLOVE BIOGEL PI IND STRL 8 (GLOVE) ×1 IMPLANT
GOWN STRL SURGICAL XL XLNG (GOWN DISPOSABLE) ×2 IMPLANT
HEAD FEM UNIPOLAR 54 OD STRL (Hips) IMPLANT
HOOD PEEL AWAY T7 (MISCELLANEOUS) ×3 IMPLANT
KIT BASIN OR (CUSTOM PROCEDURE TRAY) ×1 IMPLANT
KIT TURNOVER KIT A (KITS) ×1 IMPLANT
MANIFOLD NEPTUNE II (INSTRUMENTS) ×1 IMPLANT
NDL SAFETY ECLIPSE 18X1.5 (NEEDLE) ×2 IMPLANT
NS IRRIG 1000ML POUR BTL (IV SOLUTION) ×1 IMPLANT
PACK TOTAL JOINT (CUSTOM PROCEDURE TRAY) ×1 IMPLANT
PROTECTOR NERVE ULNAR (MISCELLANEOUS) ×1 IMPLANT
SPACER FEM TAPERED +0 12/14 (Hips) IMPLANT
STEM FEM ACTIS STD SZ10 (Stem) IMPLANT
SUCTION TUBE FRAZIER 12FR DISP (SUCTIONS) ×1 IMPLANT
SUT ETHIBOND NAB CT1 #1 30IN (SUTURE) ×3 IMPLANT
SUT STRATAFIX 14 PDO 48 VLT (SUTURE) ×1 IMPLANT
SUT STRATAFIX PDO 1 14 VIOLET (SUTURE) ×1 IMPLANT
SUT VIC AB 1 CT1 36 (SUTURE) ×2 IMPLANT
SUT VIC AB 2-0 CT1 TAPERPNT 27 (SUTURE) ×2 IMPLANT
SUT VIC AB 3-0 SH 27X BRD (SUTURE) ×2 IMPLANT
SYR 30ML LL (SYRINGE) ×1 IMPLANT
SYR 3ML LL SCALE MARK (SYRINGE) ×1 IMPLANT
TOWEL GREEN STERILE FF (TOWEL DISPOSABLE) ×1 IMPLANT
TOWEL OR 17X26 10 PK STRL BLUE (TOWEL DISPOSABLE) ×1 IMPLANT
TRAY FOLEY MTR SLVR 16FR STAT (SET/KITS/TRAYS/PACK) ×1 IMPLANT
TUBE SUCTION HIGH CAP CLEAR NV (SUCTIONS) ×1 IMPLANT
WATER STERILE IRR 1000ML POUR (IV SOLUTION) ×2 IMPLANT

## 2023-10-28 NOTE — Progress Notes (Addendum)
 MEDICATION-RELATED CONSULT NOTE   Fracture care post-operative anticoagulation per pharmacy consult   Procedure: L femoral neck fracture     Completed: 1405  Antithrombotic medications on inpatient or PTA profile prior to procedure:  SQ heparin   Recommended restart time: Per ortho, "Restart PTA direct oral anticoagulant (DOAC) on POD1 pending AM hemoglobin being stable and not requiring blood transfusion. Resume Eliquis  if hgb >/=8."  Additional notes: no complications to therapy noted in op note  Plan:     F/u 5/12 AM CBC  Order Eliquis  to restart tomorrow AM, after CBC results Discontinue SQH

## 2023-10-28 NOTE — Anesthesia Procedure Notes (Signed)
 Procedure Name: Intubation Date/Time: 10/28/2023 12:10 PM  Performed by: Hebert Littler, CRNAPre-anesthesia Checklist: Patient identified, Emergency Drugs available, Suction available, Patient being monitored and Timeout performed Patient Re-evaluated:Patient Re-evaluated prior to induction Oxygen Delivery Method: Circle system utilized Preoxygenation: Pre-oxygenation with 100% oxygen Induction Type: IV induction Ventilation: Mask ventilation without difficulty Laryngoscope Size: Glidescope and 3 Grade View: Grade I Tube size: 7.0 mm Number of attempts: 1 Airway Equipment and Method: Patient positioned with wedge pillow and Video-laryngoscopy Placement Confirmation: ETT inserted through vocal cords under direct vision, positive ETCO2, CO2 detector and breath sounds checked- equal and bilateral Secured at: 22 cm Tube secured with: Tape

## 2023-10-28 NOTE — Transfer of Care (Signed)
 Immediate Anesthesia Transfer of Care Note  Patient: Gary Atkins  Procedure(s) Performed: HEMIARTHROPLASTY (BIPOLAR) HIP, POSTERIOR APPROACH FOR FRACTURE (Left: Hip)  Patient Location: PACU  Anesthesia Type:General  Level of Consciousness: awake, alert , oriented, and patient cooperative  Airway & Oxygen Therapy: Patient Spontanous Breathing and Patient connected to face mask oxygen  Post-op Assessment: Report given to RN, Post -op Vital signs reviewed and stable, Patient moving all extremities, and Patient moving all extremities X 4  Post vital signs: Reviewed and stable  Last Vitals:  Vitals Value Taken Time  BP 107/71 10/28/23 1415  Temp    Pulse 61 10/28/23 1415  Resp 19 10/28/23 1417  SpO2 78 % 10/28/23 1415  Vitals shown include unfiled device data.  Last Pain:  Vitals:   10/28/23 0715  TempSrc:   PainSc: Asleep         Complications: No notable events documented.

## 2023-10-28 NOTE — Plan of Care (Signed)
   Problem: Coping: Goal: Level of anxiety will decrease Outcome: Progressing   Problem: Pain Managment: Goal: General experience of comfort will improve and/or be controlled Outcome: Progressing

## 2023-10-28 NOTE — Anesthesia Postprocedure Evaluation (Signed)
 Anesthesia Post Note  Patient: Gary Atkins  Procedure(s) Performed: HEMIARTHROPLASTY (BIPOLAR) HIP, POSTERIOR APPROACH FOR FRACTURE (Left: Hip)     Patient location during evaluation: PACU Anesthesia Type: General Level of consciousness: sedated and patient cooperative Pain management: pain level controlled Vital Signs Assessment: post-procedure vital signs reviewed and stable Respiratory status: spontaneous breathing Cardiovascular status: stable Anesthetic complications: no   No notable events documented.  Last Vitals:  Vitals:   10/28/23 1530 10/28/23 1542  BP: 111/83 (!) 87/63  Pulse: 71 66  Resp: 12 18  Temp:    SpO2: 100% 100%    Last Pain:  Vitals:   10/28/23 1542  TempSrc:   PainSc: 4                  Gorman Laughter

## 2023-10-28 NOTE — Anesthesia Preprocedure Evaluation (Addendum)
 Anesthesia Evaluation  Patient identified by MRN, date of birth, ID band Patient awake    Reviewed: Allergy & Precautions, NPO status , Patient's Chart, lab work & pertinent test results  Airway Mallampati: III  TM Distance: >3 FB Neck ROM: Full    Dental  (+) Dental Advisory Given   Pulmonary shortness of breath and with exertion   Pulmonary exam normal breath sounds clear to auscultation       Cardiovascular + CAD, +CHF and + DOE  Normal cardiovascular exam Rhythm:Regular Rate:Normal  Echo 05/2022  1. Left ventricular ejection fraction, by estimation, is 60 to 65%. The left ventricle has normal function. The left ventricle has no regional wall motion abnormalities. Left ventricular diastolic parameters are indeterminate.   2. Right ventricular systolic function is normal. The right ventricular size is normal. Tricuspid regurgitation signal is inadequate for assessing PA pressure.   3. The mitral valve is normal in structure. No evidence of mitral valve regurgitation. No evidence of mitral stenosis.   4. The aortic valve is normal in structure. Aortic valve regurgitation is not visualized. No aortic stenosis is present.   5. Abdominal aorta is normal sized. 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   6. The inferior vena cava is normal in size with greater than 50% respiratory variability, suggesting right atrial pressure of 3 mmHg.   Comparison(s): EF 45%, AOR 41mm.     Neuro/Psych Seizures -, Well Controlled,  PSYCHIATRIC DISORDERS  Depression       GI/Hepatic Neg liver ROS,GERD  ,,  Endo/Other  negative endocrine ROS    Renal/GU negative Renal ROS     Musculoskeletal negative musculoskeletal ROS (+)    Abdominal   Peds  Hematology negative hematology ROS (+)   Anesthesia Other Findings   Reproductive/Obstetrics                              Anesthesia Physical Anesthesia Plan  ASA: 3  Anesthesia Plan: General   Post-op Pain Management: Ofirmev  IV (intra-op)*   Induction: Intravenous  PONV Risk Score and Plan: 3 and Ondansetron , Dexamethasone and Treatment may vary due to age or medical condition  Airway Management Planned: Oral ETT  Additional Equipment:   Intra-op Plan:   Post-operative Plan: Extubation in OR  Informed Consent: I have reviewed the patients History and Physical, chart, labs and discussed the procedure including the risks, benefits and alternatives for the proposed anesthesia with the patient or authorized representative who has indicated his/her understanding and acceptance.     Dental advisory given  Plan Discussed with: CRNA  Anesthesia Plan Comments:        Anesthesia Quick Evaluation

## 2023-10-28 NOTE — Plan of Care (Signed)
   Problem: Coping: Goal: Level of anxiety will decrease Outcome: Progressing   Problem: Pain Managment: Goal: General experience of comfort will improve and/or be controlled Outcome: Progressing   Problem: Safety: Goal: Ability to remain free from injury will improve Outcome: Progressing

## 2023-10-28 NOTE — Progress Notes (Signed)
 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  Patient reports he tripped over the rack in the yard with subsequent left hip pain.  In the ED imaging confirms left subcapital femoral neck fracture.  Patient was admitted for further evaluation.  Assessment & Plan:   Principal Problem:   Femur fracture, left (HCC) Active Problems:   CAD (coronary artery disease)   NICM (nonischemic cardiomyopathy) (HCC)   Paroxysmal ventricular tachycardia (HCC)   PVC's (premature ventricular contractions)   Osteoporosis   Seizure (HCC)   Chronic combined systolic and diastolic heart failure (HCC)   Nonobstructive atherosclerosis of coronary artery   Atrial fibrillation (HCC)  Left femoral neck fracture: Patient presented status post mechanical fall, with history of osteoporosis. Imaging confirms left subcapital femoral neck fracture. Orthopedic consulted, scheduled to have ORIF today. Eliquis  is on hold. PT and OT postoperatively. Continue adequate pain control with Dilaudid and oxycodone  as needed  Combined systolic and diastolic heart failure: Nonobstructive CAD /nonischemic cardiomyopathy AVNRT status post ablation and pacemaker placement Paroxysmal A-fib Currently without exacerbation.Denies any chest pain or shortness of breath. Denies any signs of ACS. Heart rate is well-controlled. Patient has taken Eliquis  dose yesterday. Resume Lasix , all other home medications.  Seizure disorder Continue Depakote .  Well-controlled.  DVT  prophylaxis: Heparin  subcu Code Status: Full code Family Communication: Wife at bedside Disposition Plan:    Status is: Inpatient Remains inpatient appropriate because: Admitted for left hip fracture status post mechanical fall,  scheduled for ORIF today.   Consultants:  Orthopedics  Procedures: Scheduled ORIF today. Antimicrobials:  Anti-infectives (From admission, onward)    Start     Dose/Rate Route Frequency Ordered Stop   10/28/23 1031  ceFAZolin (ANCEF) 2-4 GM/100ML-% IVPB       Note to Pharmacy: Regino Caprio M: cabinet override      10/28/23 1031 10/28/23 2244      Subjective: Patient was seen and examined at bedside. Overnight events noted. Patient reports doing better, He reports having pain in the left hip when he moves. Patient is scheduled for ORIF today.  Objective: Vitals:   10/28/23 0202 10/28/23 0622 10/28/23 0635 10/28/23 1001  BP: 112/69 130/78 130/76 127/79  Pulse: 79 80  86  Resp: 15 14 16 20   Temp: 98.7 F (37.1 C) 98.3 F (36.8 C) 98.2 F (36.8 C) 98.7 F (37.1 C)  TempSrc:   Oral   SpO2: 99% 99% 99% 99%  Weight:      Height:        Intake/Output Summary (Last 24 hours) at 10/28/2023 1328 Last data filed at 10/28/2023 1320 Gross per 24 hour  Intake 150 ml  Output 600 ml  Net -450 ml   Filed Weights   10/27/23 1155  Weight: 74.8 kg    Examination:  General exam: Appears calm and comfortable, not in any acute distress. Respiratory system: CTA Bilaterally. Respiratory effort normal.  RR 16 Cardiovascular system: S1 & S2 heard, RRR. No JVD, murmurs, rubs, gallops or clicks. No pedal edema. Gastrointestinal system: Abdomen is non distended, soft and non tender.  Normal  bowel sounds heard. Central nervous system: Alert and oriented x 3. No focal neurological deficits. Extremities: Left hip tenderness noted restricted movements. Skin: No rashes, lesions or ulcers Psychiatry: Judgement and insight appear normal. Mood & affect appropriate.    Data Reviewed: I have personally reviewed following labs and imaging studies  CBC: Recent Labs  Lab 10/27/23 1325 10/28/23 0330  WBC 7.4 7.3  HGB 14.6 13.5  HCT 42.6 40.8  MCV 94.9 98.8  PLT 133* 122*   Basic Metabolic Panel: Recent Labs  Lab 10/27/23 1325 10/28/23 0330  NA 135 136  K 4.4 4.2  CL 105 105  CO2 19* 22  GLUCOSE 116* 154*  BUN 24* 25*  CREATININE 1.10 1.15  CALCIUM  9.0 8.7*   GFR: Estimated Creatinine Clearance: 47.8 mL/min (by C-G formula based on SCr of 1.15 mg/dL). Liver Function Tests: No results for input(s): "AST", "ALT", "ALKPHOS", "BILITOT", "PROT", "ALBUMIN" in the last 168 hours. No results for input(s): "LIPASE", "AMYLASE" in the last 168 hours. No results for input(s): "AMMONIA" in the last 168 hours. Coagulation Profile: No results for input(s): "INR", "PROTIME" in the last 168 hours. Cardiac Enzymes: No results for input(s): "CKTOTAL", "CKMB", "CKMBINDEX", "TROPONINI" in the last 168 hours. BNP (last 3 results) No results for input(s): "PROBNP" in the last 8760 hours. HbA1C: No results for input(s): "HGBA1C" in the last 72 hours. CBG: No results for input(s): "GLUCAP" in the last 168 hours. Lipid Profile: No results for input(s): "CHOL", "HDL", "LDLCALC", "TRIG", "CHOLHDL", "LDLDIRECT" in the last 72 hours. Thyroid  Function Tests: No results for input(s): "TSH", "T4TOTAL", "FREET4", "T3FREE", "THYROIDAB" in the last 72 hours. Anemia Panel: No results for input(s): "VITAMINB12", "FOLATE", "FERRITIN", "TIBC", "IRON", "RETICCTPCT" in the last 72 hours. Sepsis Labs: No results for input(s): "PROCALCITON", "LATICACIDVEN" in the last 168 hours.  No results found for this or any previous visit (from the past 240 hours).       Radiology Studies: DG Hip Unilat W or Wo Pelvis 2-3 Views Left Result Date: 10/27/2023 CLINICAL DATA:  Status post fall. EXAM: DG HIP (WITH OR WITHOUT PELVIS) 2-3V LEFT COMPARISON:  None Available. FINDINGS:  There is an acute, subcapital left femoral neck fracture. The proximal displacement of the distal fracture fragments noted. No dislocation. Right hip appears intact. No signs of pelvic fracture or diastasis. IMPRESSION: Acute, displaced subcapital left femoral neck fracture. Electronically Signed   By: Kimberley Penman M.D.   On: 10/27/2023 14:08   CT Head Wo Contrast Result Date: 10/27/2023 CLINICAL DATA:  Trauma, trip and fall while gardening. On anticoagulation for atrial fibrillation. EXAM: CT HEAD WITHOUT CONTRAST CT CERVICAL SPINE WITHOUT CONTRAST TECHNIQUE: Multidetector CT imaging of the head and cervical spine was performed following the standard protocol without intravenous contrast. Multiplanar CT image reconstructions of the cervical spine were also generated. RADIATION DOSE REDUCTION: This exam was performed according to the departmental dose-optimization program which includes automated exposure control, adjustment of the mA and/or kV according to patient size and/or use of iterative reconstruction technique. COMPARISON:  05/14/2022. FINDINGS: CT HEAD FINDINGS Brain: No acute intracranial hemorrhage. No CT evidence of acute infarct. No edema, mass effect, or midline shift. The basilar cisterns are patent. Ventricles: Prominence of the ventricles suggesting underlying parenchymal volume loss. Vascular: Atherosclerotic calcifications of the carotid siphons. Mild focal atherosclerosis of the right M1 segment is slightly increased from prior. No hyperdense vessel. Skull: No acute or aggressive finding. Orbits: Orbits are symmetric. Sinuses: Mucosal thickening in the ethmoid sinuses, slightly greater  on the right. Other: Mastoid air cells are clear. CT CERVICAL SPINE FINDINGS Alignment: Alignment is maintained. No listhesis. No facet subluxation or dislocation. Skull base and vertebrae: No acute fracture. No primary bone lesion or focal pathologic process. Soft tissues and spinal canal: No prevertebral  fluid or swelling. No visible canal hematoma. Disc levels: Moderate disc space narrowing at multiple levels. Severe disc space narrowing posteriorly at C3-4. Disc osteophyte complexes at multiple levels in the cervical spine without high-grade osseous spinal canal stenosis. Facet arthrosis and uncovertebral hypertrophy at multiple levels. Upper chest: Negative. Other: None. IMPRESSION: No CT evidence of acute intracranial abnormality. No acute fracture or traumatic malalignment of the cervical spine. Generalized parenchymal volume loss. Degenerative changes of the cervical spine as above. Electronically Signed   By: Denny Flack M.D.   On: 10/27/2023 13:54   CT Cervical Spine Wo Contrast Result Date: 10/27/2023 CLINICAL DATA:  Trauma, trip and fall while gardening. On anticoagulation for atrial fibrillation. EXAM: CT HEAD WITHOUT CONTRAST CT CERVICAL SPINE WITHOUT CONTRAST TECHNIQUE: Multidetector CT imaging of the head and cervical spine was performed following the standard protocol without intravenous contrast. Multiplanar CT image reconstructions of the cervical spine were also generated. RADIATION DOSE REDUCTION: This exam was performed according to the departmental dose-optimization program which includes automated exposure control, adjustment of the mA and/or kV according to patient size and/or use of iterative reconstruction technique. COMPARISON:  05/14/2022. FINDINGS: CT HEAD FINDINGS Brain: No acute intracranial hemorrhage. No CT evidence of acute infarct. No edema, mass effect, or midline shift. The basilar cisterns are patent. Ventricles: Prominence of the ventricles suggesting underlying parenchymal volume loss. Vascular: Atherosclerotic calcifications of the carotid siphons. Mild focal atherosclerosis of the right M1 segment is slightly increased from prior. No hyperdense vessel. Skull: No acute or aggressive finding. Orbits: Orbits are symmetric. Sinuses: Mucosal thickening in the ethmoid sinuses,  slightly greater on the right. Other: Mastoid air cells are clear. CT CERVICAL SPINE FINDINGS Alignment: Alignment is maintained. No listhesis. No facet subluxation or dislocation. Skull base and vertebrae: No acute fracture. No primary bone lesion or focal pathologic process. Soft tissues and spinal canal: No prevertebral fluid or swelling. No visible canal hematoma. Disc levels: Moderate disc space narrowing at multiple levels. Severe disc space narrowing posteriorly at C3-4. Disc osteophyte complexes at multiple levels in the cervical spine without high-grade osseous spinal canal stenosis. Facet arthrosis and uncovertebral hypertrophy at multiple levels. Upper chest: Negative. Other: None. IMPRESSION: No CT evidence of acute intracranial abnormality. No acute fracture or traumatic malalignment of the cervical spine. Generalized parenchymal volume loss. Degenerative changes of the cervical spine as above. Electronically Signed   By: Denny Flack M.D.   On: 10/27/2023 13:54   Scheduled Meds:  [MAR Hold] divalproex   500 mg Oral BID   [MAR Hold] furosemide   40 mg Oral Daily   [MAR Hold] heparin  injection (subcutaneous)  5,000 Units Subcutaneous Q8H   Continuous Infusions:  ceFAZolin       LOS: 1 day    Time spent: 50 mins    Magdalene School, MD [facility]   If 7PM-7AM, please contact night-coverage

## 2023-10-28 NOTE — Discharge Instructions (Signed)
INSTRUCTIONS AFTER JOINT REPLACEMENT   Remove items at home which could result in a fall. This includes throw rugs or furniture in walking pathways ICE to the affected joint every three hours while awake for 30 minutes at a time, for at least the first 3-5 days, and then as needed for pain and swelling.  Continue to use ice for pain and swelling. You may notice swelling that will progress down to the foot and ankle.  This is normal after surgery.  Elevate your leg when you are not up walking on it.   Continue to use the breathing machine you got in the hospital (incentive spirometer) which will help keep your temperature down.  It is common for your temperature to cycle up and down following surgery, especially at night when you are not up moving around and exerting yourself.  The breathing machine keeps your lungs expanded and your temperature down.   DIET:  As you were doing prior to hospitalization, we recommend a well-balanced diet.  DRESSING / WOUND CARE / SHOWERING  You may shower 3 days after surgery, but keep the wounds dry during showering.  You may use an occlusive plastic wrap (Press'n Seal for example), NO SOAKING/SUBMERGING IN THE BATHTUB.  If the bandage gets wet, change with a clean dry gauze.  If the incision gets wet, pat the wound dry with a clean towel.  ACTIVITY  Increase activity slowly as tolerated, but follow the weight bearing instructions below.   No driving for 6 weeks or until further direction given by your physician.  You cannot drive while taking narcotics.  No lifting or carrying greater than 10 lbs. until further directed by your surgeon. Avoid periods of inactivity such as sitting longer than an hour when not asleep. This helps prevent blood clots.  You may return to work once you are authorized by your doctor.     WEIGHT BEARING   Weight bearing as tolerated with assist device (walker, cane, etc) as directed, use it as long as suggested by your surgeon or  therapist, typically at least 4-6 weeks.   EXERCISES  Results after joint replacement surgery are often greatly improved when you follow the exercise, range of motion and muscle strengthening exercises prescribed by your doctor. Safety measures are also important to protect the joint from further injury. Any time any of these exercises cause you to have increased pain or swelling, decrease what you are doing until you are comfortable again and then slowly increase them. If you have problems or questions, call your caregiver or physical therapist for advice.   Rehabilitation is important following a joint replacement. After just a few days of immobilization, the muscles of the leg can become weakened and shrink (atrophy).  These exercises are designed to build up the tone and strength of the thigh and leg muscles and to improve motion. Often times heat used for twenty to thirty minutes before working out will loosen up your tissues and help with improving the range of motion but do not use heat for the first two weeks following surgery (sometimes heat can increase post-operative swelling).   These exercises can be done on a training (exercise) mat, on the floor, on a table or on a bed. Use whatever works the best and is most comfortable for you.    Use music or television while you are exercising so that the exercises are a pleasant break in your day. This will make your life better with the exercises acting   as a break in your routine that you can look forward to.   Perform all exercises about fifteen times, three times per day or as directed.  You should exercise both the operative leg and the other leg as well.  Exercises include:   Quad Sets - Tighten up the muscle on the front of the thigh (Quad) and hold for 5-10 seconds.   Straight Leg Raises - With your knee straight (if you were given a brace, keep it on), lift the leg to 60 degrees, hold for 3 seconds, and slowly lower the leg.  Perform this  exercise against resistance later as your leg gets stronger.  Leg Slides: Lying on your back, slowly slide your foot toward your buttocks, bending your knee up off the floor (only go as far as is comfortable). Then slowly slide your foot back down until your leg is flat on the floor again.  Angel Wings: Lying on your back spread your legs to the side as far apart as you can without causing discomfort.  Hamstring Strength:  Lying on your back, push your heel against the floor with your leg straight by tightening up the muscles of your buttocks.  Repeat, but this time bend your knee to a comfortable angle, and push your heel against the floor.  You may put a pillow under the heel to make it more comfortable if necessary.   A rehabilitation program following joint replacement surgery can speed recovery and prevent re-injury in the future due to weakened muscles. Contact your doctor or a physical therapist for more information on knee rehabilitation.    CONSTIPATION  Constipation is defined medically as fewer than three stools per week and severe constipation as less than one stool per week.  Even if you have a regular bowel pattern at home, your normal regimen is likely to be disrupted due to multiple reasons following surgery.  Combination of anesthesia, postoperative narcotics, change in appetite and fluid intake all can affect your bowels.   YOU MUST use at least one of the following options; they are listed in order of increasing strength to get the job done.  They are all available over the counter, and you may need to use some, POSSIBLY even all of these options:    Drink plenty of fluids (prune juice may be helpful) and high fiber foods Colace 100 mg by mouth twice a day  Senokot for constipation as directed and as needed Dulcolax (bisacodyl), take with full glass of water  Miralax (polyethylene glycol) once or twice a day as needed.  If you have tried all these things and are unable to have a  bowel movement in the first 3-4 days after surgery call either your surgeon or your primary doctor.    If you experience loose stools or diarrhea, hold the medications until you stool forms back up.  If your symptoms do not get better within 1 week or if they get worse, check with your doctor.  If you experience "the worst abdominal pain ever" or develop nausea or vomiting, please contact the office immediately for further recommendations for treatment.   ITCHING:  If you experience itching with your medications, try taking only a single pain pill, or even half a pain pill at a time.  You can also use Benadryl over the counter for itching or also to help with sleep.   TED HOSE STOCKINGS:  Use stockings on both legs until for at least 2 weeks or as directed by   physician office. They may be removed at night for sleeping.  MEDICATIONS:  See your medication summary on the "After Visit Summary" that nursing will review with you.  You may have some home medications which will be placed on hold until you complete the course of blood thinner medication.  It is important for you to complete the blood thinner medication as prescribed.  PRECAUTIONS:  If you experience chest pain or shortness of breath - call 911 immediately for transfer to the hospital emergency department.   If you develop a fever greater that 101 F, purulent drainage from wound, increased redness or drainage from wound, foul odor from the wound/dressing, or calf pain - CONTACT YOUR SURGEON.                                                   FOLLOW-UP APPOINTMENTS:  If you do not already have a post-op appointment, please call the office for an appointment to be seen by your surgeon.  Guidelines for how soon to be seen are listed in your "After Visit Summary", but are typically between 1-4 weeks after surgery.  OTHER INSTRUCTIONS:    POST-OPERATIVE OPIOID TAPER INSTRUCTIONS: It is important to wean off of your opioid medication as soon as  possible. If you do not need pain medication after your surgery it is ok to stop day one. Opioids include: Codeine, Hydrocodone(Norco, Vicodin), Oxycodone(Percocet, oxycontin) and hydromorphone amongst others.  Long term and even short term use of opiods can cause: Increased pain response Dependence Constipation Depression Respiratory depression And more.  Withdrawal symptoms can include Flu like symptoms Nausea, vomiting And more Techniques to manage these symptoms Hydrate well Eat regular healthy meals Stay active Use relaxation techniques(deep breathing, meditating, yoga) Do Not substitute Alcohol to help with tapering If you have been on opioids for less than two weeks and do not have pain than it is ok to stop all together.  Plan to wean off of opioids This plan should start within one week post op of your joint replacement. Maintain the same interval or time between taking each dose and first decrease the dose.  Cut the total daily intake of opioids by one tablet each day Next start to increase the time between doses. The last dose that should be eliminated is the evening dose.   MAKE SURE YOU:  Understand these instructions.  Get help right away if you are not doing well or get worse.    Thank you for letting us be a part of your medical care team.  It is a privilege we respect greatly.  We hope these instructions will help you stay on track for a fast and full recovery!      

## 2023-10-28 NOTE — Progress Notes (Signed)
 The risks benefits and alternatives were discussed with the patient including but not limited to the risks of nonoperative treatment, versus surgical intervention including infection, bleeding, nerve injury, periprosthetic fracture, the need for revision surgery, dislocation, leg length discrepancy, blood clots, cardiopulmonary complications, morbidity, mortality, among others, and they were willing to proceed.    Left hip pain, left hip fracture displaced femoral neck.  Plan hemiarthroplasty.  He can dorsiflex his toes but seems a little reluctant/weak.  Will monitor.  Neville Barbone, MD

## 2023-10-28 NOTE — Plan of Care (Signed)

## 2023-10-28 NOTE — Op Note (Signed)
 10/27/2023 - 10/28/2023  1:44 PM  PATIENT:  Gary Atkins   MRN: [id number]  PRE-OPERATIVE DIAGNOSIS:  Left Femoral Neck Fracture  POST-OPERATIVE DIAGNOSIS:  Left Femoral Neck Fracture  PROCEDURE:  Procedure(s): HEMIARTHROPLASTY (BIPOLAR) HIP, POSTERIOR APPROACH FOR FRACTURE  PREOPERATIVE INDICATIONS:  Gary Atkins is an 83 y.o. male who was admitted 10/27/2023 with a diagnosis of Femur fracture, left (HCC) and elected for surgical management.  The risks benefits and alternatives were discussed with the patient including but not limited to the risks of nonoperative treatment, versus surgical intervention including infection, bleeding, nerve injury, periprosthetic fracture, the need for revision surgery, dislocation, leg length discrepancy, blood clots, cardiopulmonary complications, morbidity, mortality, among others, and they were willing to proceed.  Predicted outcome is good, although there will be at least a six to nine month expected recovery.   OPERATIVE REPORT     SURGEON:  Osa Blase, MD    ASSISTANT:  Hurshel Maidens, PA-C,   (Present throughout the entire procedure,  necessary for completion of procedure in a timely manner, assisting with retraction, instrumentation, and closure)     ANESTHESIA: General  ESTIMATED BLOOD LOSS: 400 mL    COMPLICATIONS:  None.   UNIQUE ASPECTS OF THE CASE: The femoral head was fairly large, measured 54, and the canal was extremely large.  The fracture extended distally on the anterior cortex, so I cut the femoral neck twice in order to try and eliminate the fracture line.  I potted the 10 slightly proud, I debated about a high offset although the tissues closed nicely, and he was very stable.  Therefore I went with the standard offset.     COMPONENTS:    Implant Name Type Inv. Item Serial No. Manufacturer Lot No. LRB No. Used Action  HEAD FEM UNIPOLAR 54 OD STRL - [id number] Hips HEAD FEM UNIPOLAR 54 OD STRL  [employer] [id number] Left 1  Implanted  SPACER FEM TAPERED +0 12/14 - [id number] Hips SPACER FEM TAPERED +0 12/14  [employer]PU[employer] [id number] Left 1 Implanted  STEM FEM ACTIS STD SZ10 - [id number] Stem STEM FEM ACTIS STD SZ10  [employer] [id number] Left 1 Implanted      PROCEDURE IN DETAIL: The patient was met in the holding area and identified.  The appropriate hip  was marked at the operative site. The patient was then transported to the OR and  placed under anesthesia.  At that point, the patient was  placed in the lateral decubitus position with the operative side up and  secured to the operating room table and all bony prominences padded.     The operative lower extremity was prepped from the iliac crest to the toes.  Sterile draping was performed.  Time out was performed prior to incision.      A routine posterolateral approach was utilized via sharp dissection  carried down to the subcutaneous tissue.  Gross bleeders were Bovie  coagulated.  The iliotibial band was identified and incised  along the length of the skin incision.  Self-retaining retractors were  inserted.  With the hip internally rotated, the short external rotators  were identified. The piriformis was tagged with Ethibond, and the hip capsule released in a T-type fashion.  The femoral neck was exposed, and I resected the femoral neck using the appropriate jig. This was performed at approximately a thumb's breadth above the lesser trochanter.    I then exposed the deep acetabulum, cleared out any tissue including the ligamentum teres.  I then prepared the proximal femur using the cookie-cutter, the lateralizing reamer, and then sequentially broached.  A trial utilized, and I reduced the hip and it was found to have excellent stability with functional range of motion. The trial components were then removed.   I then place the real implant and I impacted the real head ball into place. The hip was then reduced and taken through functional range of  motion and found to have excellent stability. Leg lengths were restored.  I then used a 2 mm drill bits to pass the Ethibond suture from the capsule and piriformis through the greater trochanter, and secured this. Excellent posterior capsular repair was achieved. I also closed the T in the capsule.  I then irrigated the hip copiously again with pulse lavage, and repaired the fascia with Vicryl, followed by Vicryl for the subcutaneous tissue, Monocryl for the skin, Steri-Strips and sterile gauze. The wounds were injected. The patient was then awakened and returned to PACU in stable and satisfactory condition. There were no complications.  Osa Blase, MD Orthopedic Surgeon [phone]   10/28/2023 1:44 PM

## 2023-10-29 ENCOUNTER — Encounter (HOSPITAL_COMMUNITY): Payer: Self-pay | Admitting: Orthopedic Surgery

## 2023-10-29 ENCOUNTER — Ambulatory Visit (INDEPENDENT_AMBULATORY_CARE_PROVIDER_SITE_OTHER): Payer: Medicare Other

## 2023-10-29 DIAGNOSIS — S72012A Unspecified intracapsular fracture of left femur, initial encounter for closed fracture: Secondary | ICD-10-CM | POA: Diagnosis not present

## 2023-10-29 DIAGNOSIS — I480 Paroxysmal atrial fibrillation: Secondary | ICD-10-CM

## 2023-10-29 DIAGNOSIS — R55 Syncope and collapse: Secondary | ICD-10-CM

## 2023-10-29 LAB — BASIC METABOLIC PANEL WITH GFR
Anion gap: 9 (ref 5–15)
BUN: 30 mg/dL — ABNORMAL HIGH (ref 8–23)
CO2: 23 mmol/L (ref 22–32)
Calcium: 8.6 mg/dL — ABNORMAL LOW (ref 8.9–10.3)
Chloride: 102 mmol/L (ref 98–111)
Creatinine, Ser: 1.31 mg/dL — ABNORMAL HIGH (ref 0.61–1.24)
GFR, Estimated: 54 mL/min — ABNORMAL LOW (ref >60)
Glucose, Bld: 156 mg/dL — ABNORMAL HIGH (ref 70–99)
Potassium: 4.4 mmol/L (ref 3.5–5.1)
Sodium: 134 mmol/L — ABNORMAL LOW (ref 135–145)

## 2023-10-29 LAB — CUP PACEART REMOTE DEVICE CHECK
Date Time Interrogation Session: 20250511234233
Implantable Pulse Generator Implant Date: 20240702

## 2023-10-29 LAB — CBC
HCT: 34.5 % — ABNORMAL LOW (ref 39.0–52.0)
Hemoglobin: 11.2 g/dL — ABNORMAL LOW (ref 13.0–17.0)
MCH: 32.3 pg (ref 26.0–34.0)
MCHC: 32.5 g/dL (ref 30.0–36.0)
MCV: 99.4 fL (ref 80.0–100.0)
Platelets: 101 10*3/uL — ABNORMAL LOW (ref 150–400)
RBC: 3.47 MIL/uL — ABNORMAL LOW (ref 4.22–5.81)
RDW: 13.3 % (ref 11.5–15.5)
WBC: 8.9 10*3/uL (ref 4.0–10.5)
nRBC: 0 % (ref 0.0–0.2)

## 2023-10-29 MED ORDER — SODIUM CHLORIDE 0.9 % IV SOLN: INTRAVENOUS | Status: AC

## 2023-10-29 MED ORDER — SODIUM CHLORIDE 0.9 % IV BOLUS
500.0000 mL | Freq: Once | INTRAVENOUS | Status: AC
  Administered 2023-10-29: 500 mL via INTRAVENOUS

## 2023-10-29 NOTE — Plan of Care (Signed)
   Problem: Coping: Goal: Level of anxiety will decrease Outcome: Progressing   Problem: Pain Managment: Goal: General experience of comfort will improve and/or be controlled Outcome: Progressing   Problem: Safety: Goal: Ability to remain free from injury will improve Outcome: Progressing

## 2023-10-29 NOTE — TOC Initial Note (Signed)
 Transition of Care [facility]) - Initial/Assessment Note    Patient Details  Name: Gary Atkins MRN: [id number] Date of Birth: 1941/03/06  [facility] CM/SW Contact:    Delilah Fend, LCSW Phone Number: 10/29/2023, 3:10 PM  Clinical Narrative:                  Met with pt and step-daughter this afternoon to review PT recommendations for SNF rehab.  Pt very pleasant and fully agreeable with this plan.  Notes his wife is 45 yrs old and supportive, however, cannot provide current level of assistance he needs.  Reviewed SNF preference and bed search process.  Will begin bed search and insurance authorization.   Expected Discharge Plan: Skilled Nursing Facility Barriers to Discharge: Insurance Authorization, SNF Pending bed offer   Patient Goals and CMS Choice Patient states their goals for this hospitalization and ongoing recovery are:: return home following rehab          Expected Discharge Plan and Services In-house Referral: Clinical Social Work   Post Acute Care Choice: Skilled Nursing Facility Living arrangements for the past 2 months: Single Family Home                 DME Arranged: N/A DME Agency: NA                  Prior Living Arrangements/Services Living arrangements for the past 2 months: Single Family Home Lives with:: Spouse Patient language and need for interpreter reviewed:: Yes Do you feel safe going back to the place where you live?: Yes      Need for Family Participation in Patient Care: Yes (Comment) Care giver support system in place?: Yes (comment)   Criminal Activity/Legal Involvement Pertinent to Current Situation/Hospitalization: No - Comment as needed  Activities of Daily Living   ADL Screening (condition at time of admission) Independently performs ADLs?: Yes (appropriate for developmental age) Is the patient deaf or have difficulty hearing?: No Does the patient have difficulty seeing, even when wearing glasses/contacts?: No Does  the patient have difficulty concentrating, remembering, or making decisions?: No  Permission Sought/Granted Permission sought to share information with : Family Supports Permission granted to share information with : Yes, Verbal Permission Granted  Share Information with NAME: spouse, Merari Maish @ [phone]           Emotional Assessment Appearance:: Appears stated age Attitude/Demeanor/Rapport: Gracious Affect (typically observed): Accepting Orientation: : Oriented to Self, Oriented to Place, Oriented to  Time, Oriented to Situation Alcohol / Substance Use: Not Applicable Psych Involvement: No (comment)  Admission diagnosis:  Femur fracture, left (HCC) [S72.92XA] Closed fracture of neck of left femur, initial encounter (HCC) [S72.002A] Patient Active Problem List   Diagnosis Date Noted   Femur fracture, left (HCC) 10/27/2023   Low blood pressure 06/24/2023   Injury of left ankle 02/09/2023   Left leg cellulitis 02/06/2023   Right-sided chest wall pain 10/17/2022   Chest pain, musculoskeletal 10/17/2022   Impacted cerumen of right ear 07/29/2022   Atrial fibrillation (HCC) 06/15/2022   Laceration of head 05/27/2022   Syncope 05/23/2022   Nonobstructive atherosclerosis of coronary artery 02/02/2018   Benign paroxysmal positional vertigo of right ear 03/21/2017   Bilateral hearing loss 10/11/2016   Closed compression fracture of L1 vertebra (HCC) 06/29/2016   Cough 07/05/2015   Left-sided chest pain 07/05/2015   Hyperglycemia 07/05/2015   Memory loss 12/09/2014   Contusion 08/17/2014   Chronic combined systolic and diastolic heart  failure (HCC) 07/07/2014   Hearing loss in right ear 01/24/2014   Depression 01/02/2013   Chills (without fever) 01/02/2013   History of international travel 11/27/2011   C. difficile colitis    GERD (gastroesophageal reflux disease)    Diverticulosis of colon    Encounter for well adult exam with abnormal findings 12/15/2010   Midline  low back pain without sciatica 12/15/2010   SINUS BRADYCARDIA 08/17/2010   Paroxysmal ventricular tachycardia (HCC) 12/03/2009   PVC's (premature ventricular contractions) 10/01/2009   CAD (coronary artery disease) 08/31/2009   Seizure (HCC) 08/11/2009   NICM (nonischemic cardiomyopathy) (HCC) 07/30/2009   DOE (dyspnea on exertion) 07/20/2009   HLD (hyperlipidemia) 11/12/2007   Diverticulosis of colon 11/12/2007   Osteoporosis 11/12/2007   History of colonic polyps 11/12/2007   NEPHROLITHIASIS, HX OF 11/12/2007   PCP:  Roslyn Coombe, MD Pharmacy:   [facility] [address] [place] - [address] [place] [address] Phone: [phone] Fax: [phone]     Social Drivers of Health (SDOH) Social History: SDOH Screenings   Food Insecurity: No Food Insecurity (10/27/2023)  Housing: Low Risk  (10/27/2023)  Transportation Needs: No Transportation Needs (10/27/2023)  Utilities: Not At Risk (10/27/2023)  Alcohol Screen: Low Risk  (02/15/2023)  Depression (PHQ2-9): Low Risk  (06/21/2023)  Financial Resource Strain: Low Risk  (02/15/2023)  Physical Activity: Insufficiently Active (02/15/2023)  Social Connections: Socially Isolated (10/27/2023)  Stress: No Stress Concern Present (02/15/2023)  Tobacco Use: Low Risk  (10/27/2023)  Health Literacy: Adequate Health Literacy (02/15/2023)   SDOH Interventions: Food Insecurity Interventions: Intervention Not Indicated Housing Interventions: Intervention Not Indicated Transportation Interventions: Intervention Not Indicated Utilities Interventions: Intervention Not Indicated Social Connections Interventions: Intervention Not Indicated   Readmission Risk Interventions    10/29/2023    3:08 PM  Readmission Risk Prevention Plan  Transportation Screening Complete  PCP or Specialist Appt within 5-7 Days Complete  Home Care Screening Complete  Medication Review (RN CM) Complete

## 2023-10-29 NOTE — [place] FL2 (Signed)
 [place]  MEDICAID FL2 LEVEL OF CARE FORM     IDENTIFICATION  Patient Name: Gary Atkins Birthdate: 1940-12-05 Sex: male Admission Date (Current Location): 10/27/2023  [facility] and [place] Number:  Producer, television/film/video and Address:  [facility],  [address], [place] [address]      Provider Number: [id number]  Attending Physician Name and Address:  Magdalene School, MD  Relative Name and Phone Number:  wife, Singleton Polek @ [phone]    Current Level of Care: Hospital Recommended Level of Care: Skilled Nursing Facility Prior Approval Number:    Date Approved/Denied:   PASRR Number: [id number]  Discharge Plan: SNF    Current Diagnoses: Patient Active Problem List   Diagnosis Date Noted   Femur fracture, left (HCC) 10/27/2023   Low blood pressure 06/24/2023   Injury of left ankle 02/09/2023   Left leg cellulitis 02/06/2023   Right-sided chest wall pain 10/17/2022   Chest pain, musculoskeletal 10/17/2022   Impacted cerumen of right ear 07/29/2022   Atrial fibrillation (HCC) 06/15/2022   Laceration of head 05/27/2022   Syncope 05/23/2022   Nonobstructive atherosclerosis of coronary artery 02/02/2018   Benign paroxysmal positional vertigo of right ear 03/21/2017   Bilateral hearing loss 10/11/2016   Closed compression fracture of L1 vertebra (HCC) 06/29/2016   Cough 07/05/2015   Left-sided chest pain 07/05/2015   Hyperglycemia 07/05/2015   Memory loss 12/09/2014   Contusion 08/17/2014   Chronic combined systolic and diastolic heart failure (HCC) 07/07/2014   Hearing loss in right ear 01/24/2014   Depression 01/02/2013   Chills (without fever) 01/02/2013   History of international travel 11/27/2011   C. difficile colitis    GERD (gastroesophageal reflux disease)    Diverticulosis of colon    Encounter for well adult exam with abnormal findings 12/15/2010   Midline low back pain without sciatica 12/15/2010   SINUS BRADYCARDIA 08/17/2010    Paroxysmal ventricular tachycardia (HCC) 12/03/2009   PVC's (premature ventricular contractions) 10/01/2009   CAD (coronary artery disease) 08/31/2009   Seizure (HCC) 08/11/2009   NICM (nonischemic cardiomyopathy) (HCC) 07/30/2009   DOE (dyspnea on exertion) 07/20/2009   HLD (hyperlipidemia) 11/12/2007   Diverticulosis of colon 11/12/2007   Osteoporosis 11/12/2007   History of colonic polyps 11/12/2007   NEPHROLITHIASIS, HX OF 11/12/2007    Orientation RESPIRATION BLADDER Height & Weight     Self, Time, Situation, Place  Normal Continent, External catheter (currently with purewick) Weight: 165 lb (74.8 kg) Height:  5\' 9"  (175.3 cm)  BEHAVIORAL SYMPTOMS/MOOD NEUROLOGICAL BOWEL NUTRITION STATUS      Continent Diet (regular)  AMBULATORY STATUS COMMUNICATION OF NEEDS Skin   Limited Assist Verbally Other (Comment) (surgical incision only)                       Personal Care Assistance Level of Assistance  Bathing, Dressing Bathing Assistance: Limited assistance   Dressing Assistance: Limited assistance     Functional Limitations Info  Sight, Hearing, Speech Sight Info: Adequate Hearing Info: Adequate Speech Info: Adequate    SPECIAL CARE FACTORS FREQUENCY  PT (By licensed PT), OT (By licensed OT)     PT Frequency: 5x/wk OT Frequency: 5x/wk            Contractures Contractures Info: Not present    Additional Factors Info  Code Status, Allergies Code Status Info: Full Allergies Info: Demerol (Meperidine), Tramadol            Current Medications (10/29/2023):  This is the current hospital active medication list Current Facility-Administered Medications  Medication Dose Route Frequency Provider Last Rate Last Admin   0.9 %  sodium chloride  infusion   Intravenous Continuous Magdalene School, MD 40 mL/hr at 10/29/23 1338 Restarted at 10/29/23 1338   acetaminophen  (TYLENOL ) tablet 325-650 mg  325-650 mg Oral Q6H PRN Brown, Blaine K, PA-C       acetaminophen   (TYLENOL ) tablet 500 mg  500 mg Oral Q6H Brown, Blaine K, PA-C   500 mg at 10/29/23 1414   alum & mag hydroxide-simeth (MAALOX/MYLANTA) 200-200-20 MG/5ML suspension 30 mL  30 mL Oral Q4H PRN Brown, Blaine K, PA-C       apixaban  (ELIQUIS ) tablet 5 mg  5 mg Oral BID Roselee Cong, RPH   5 mg at 10/29/23 1014   bisacodyl (DULCOLAX) suppository 10 mg  10 mg Rectal Daily PRN Brown, Blaine K, PA-C       divalproex  (DEPAKOTE  ER) 24 hr tablet 500 mg  500 mg Oral BID Bevin Bucks, Blaine K, PA-C   500 mg at 10/29/23 1014   docusate sodium (COLACE) capsule 100 mg  100 mg Oral BID Brown, Blaine K, PA-C   100 mg at 10/29/23 1014   ferrous sulfate tablet 325 mg  325 mg Oral TID PC Brown, Blaine K, PA-C   325 mg at 10/29/23 1334   furosemide  (LASIX ) tablet 40 mg  40 mg Oral Daily Brown, Blaine K, PA-C   40 mg at 10/29/23 1014   HYDROcodone -acetaminophen  (NORCO) 7.5-325 MG per tablet 1-2 tablet  1-2 tablet Oral Q4H PRN Brown, Blaine K, PA-C       HYDROcodone -acetaminophen  (NORCO/VICODIN) 5-325 MG per tablet 1-2 tablet  1-2 tablet Oral Q4H PRN Brown, Blaine K, PA-C   1 tablet at 10/29/23 0522   magnesium citrate solution 1 Bottle  1 Bottle Oral Once PRN Brown, Blaine K, PA-C       menthol-cetylpyridinium (CEPACOL) lozenge 3 mg  1 lozenge Oral PRN Brown, Blaine K, PA-C       Or   phenol (CHLORASEPTIC) mouth spray 1 spray  1 spray Mouth/Throat PRN Brown, Blaine K, PA-C       morphine  (PF) 2 MG/ML injection 0.5-1 mg  0.5-1 mg Intravenous Q2H PRN Bevin Bucks, Blaine K, PA-C       ondansetron  (ZOFRAN ) tablet 4 mg  4 mg Oral Q6H PRN Brown, Blaine K, PA-C       Or   ondansetron  (ZOFRAN ) injection 4 mg  4 mg Intravenous Q6H PRN Brown, Blaine K, PA-C       polyethylene glycol (MIRALAX / GLYCOLAX) packet 17 g  17 g Oral Daily PRN Bevin Bucks, Blaine K, PA-C       senna (SENOKOT) tablet 8.6 mg  1 tablet Oral BID Brown, Blaine K, PA-C   8.6 mg at 10/29/23 1014     Discharge Medications: Please see discharge summary for a list of discharge  medications.  Relevant Imaging Results:  Relevant Lab Results:   Additional Information SS# [id number]  Delilah Fend, LCSW

## 2023-10-29 NOTE — Progress Notes (Addendum)
 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  Patient reports he tripped over the rack in the yard with subsequent left hip pain.  In the ED imaging confirms left subcapital femoral neck fracture.  Patient was admitted for further evaluation.  Assessment & Plan:   Principal Problem:   Femur fracture, left (HCC) Active Problems:   CAD (coronary artery disease)   NICM (nonischemic cardiomyopathy) (HCC)   Paroxysmal ventricular tachycardia (HCC)   PVC's (premature ventricular contractions)   Osteoporosis   Seizure (HCC)   Chronic combined systolic and diastolic heart failure (HCC)   Nonobstructive atherosclerosis of coronary artery   Atrial fibrillation (HCC)  Left femoral neck fracture: Patient presented status post mechanical fall, with history of osteoporosis. Imaging confirms left subcapital femoral neck fracture. Orthopedic consulted, status post ORIF, POD #1. Eliquis  resumed postoperatively. PT and OT postoperatively recommended SNF. Continue adequate pain control with Dilaudid and oxycodone  as needed.  Combined systolic and diastolic heart failure: Nonobstructive CAD /nonischemic cardiomyopathy AVNRT status post ablation and pacemaker placement Paroxysmal A-fib Currently without exacerbation. Denies any chest pain or shortness of breath. Denies any signs of ACS. Heart rate is well-controlled.  Eliquis  resumed postoperatively. Resume Lasix , all other home medications.  Seizure disorder Continue Depakote .   Well-controlled.  AKI: Likely due to decreased intake due to surgery. Continue IV hydration, monitor serum creatinine  DVT prophylaxis: Heparin  subcu Code Status: Full code Family Communication: Wife at bedside Disposition Plan:    Status is: Inpatient Remains inpatient appropriate because: Admitted for left hip fracture status post mechanical fall, status post ORIF POD #1.   Consultants:  Orthopedics  Procedures: ORIF Antimicrobials:  Anti-infectives (From admission, onward)    Start     Dose/Rate Route Frequency Ordered Stop   10/29/23 0600  ceFAZolin (ANCEF) IVPB 2g/100 mL premix  Status:  Discontinued        2 g 200 mL/hr over 30 Minutes Intravenous On call to O.R. 10/28/23 1358 10/28/23 1540   10/28/23 1800  ceFAZolin (ANCEF) IVPB 2g/100 mL premix        2 g 200 mL/hr over 30 Minutes Intravenous Every 6 hours 10/28/23 1541 10/29/23 1015   10/28/23 1031  ceFAZolin (ANCEF) 2-4 GM/100ML-% IVPB       Note to Pharmacy: Regino Caprio M: cabinet override      10/28/23 1031 10/28/23 1544      Subjective: Patient was seen and examined at bedside. Overnight events noted. Patient was sitting comfortably in the chair and states he is doing well,  Pain is well-controlled.  He ambulated with the physical therapy and recommended SNF.   Objective: Vitals:   10/28/23 2054 10/29/23 0204 10/29/23 0550 10/29/23 0928  BP: 91/76 117/89 94/64 95/67   Pulse: 72 74 72 88  Resp: 15 18 15 16   Temp: 97.6 F (36.4 C) (!) 97.5 F (36.4 C) 98.8 F (37.1 C) 98.6 F (37 C)  TempSrc: Axillary Oral Oral Oral  SpO2: 98% 99% 99% 96%  Weight:      Height:        Intake/Output Summary (Last 24  hours) at 10/29/2023 1019 Last data filed at 10/29/2023 0900 Gross per 24 hour  Intake 2412.69 ml  Output 1050 ml  Net 1362.69 ml   Filed Weights   10/27/23 1155  Weight: 74.8 kg    Examination:  General exam: Appears calm and comfortable, not in any acute distress. Respiratory system: CTA  Bilaterally. Respiratory effort normal.  RR 14 Cardiovascular system: S1 & S2 heard, RRR. No JVD, murmurs, rubs, gallops or clicks. No pedal edema. Gastrointestinal system: Abdomen is non distended, soft and non tender.  Normal bowel sounds heard. Central nervous system: Alert and oriented x 3. No focal neurological deficits. Extremities: Left hip tenderness noted,  restricted movements. POD # 1 Skin: No rashes, lesions or ulcers Psychiatry: Judgement and insight appear normal. Mood & affect appropriate.   Data Reviewed: I have personally reviewed following labs and imaging studies  CBC: Recent Labs  Lab 10/27/23 1325 10/28/23 0330 10/29/23 0326  WBC 7.4 7.3 8.9  HGB 14.6 13.5 11.2*  HCT 42.6 40.8 34.5*  MCV 94.9 98.8 99.4  PLT 133* 122* 101*   Basic Metabolic Panel: Recent Labs  Lab 10/27/23 1325 10/28/23 0330 10/29/23 0326  NA 135 136 134*  K 4.4 4.2 4.4  CL 105 105 102  CO2 19* 22 23  GLUCOSE 116* 154* 156*  BUN 24* 25* 30*  CREATININE 1.10 1.15 1.31*  CALCIUM  9.0 8.7* 8.6*   GFR: Estimated Creatinine Clearance: 42 mL/min (A) (by C-G formula based on SCr of 1.31 mg/dL (H)). Liver Function Tests: No results for input(s): "AST", "ALT", "ALKPHOS", "BILITOT", "PROT", "ALBUMIN" in the last 168 hours. No results for input(s): "LIPASE", "AMYLASE" in the last 168 hours. No results for input(s): "AMMONIA" in the last 168 hours. Coagulation Profile: No results for input(s): "INR", "PROTIME" in the last 168 hours. Cardiac Enzymes: No results for input(s): "CKTOTAL", "CKMB", "CKMBINDEX", "TROPONINI" in the last 168 hours. BNP (last 3 results) No results for input(s): "PROBNP" in the last 8760 hours. HbA1C: No results for input(s): "HGBA1C" in the last 72 hours. CBG: No results for input(s): "GLUCAP" in the last 168 hours. Lipid Profile: No results for input(s): "CHOL", "HDL", "LDLCALC", "TRIG", "CHOLHDL", "LDLDIRECT" in the last 72 hours. Thyroid  Function Tests: No  results for input(s): "TSH", "T4TOTAL", "FREET4", "T3FREE", "THYROIDAB" in the last 72 hours. Anemia Panel: No results for input(s): "VITAMINB12", "FOLATE", "FERRITIN", "TIBC", "IRON", "RETICCTPCT" in the last 72 hours. Sepsis Labs: No results for input(s): "PROCALCITON", "LATICACIDVEN" in the last 168 hours.  No results found for this or any previous visit (from the past 240 hours).  Radiology Studies: DG HIP UNILAT W OR W/O PELVIS 2-3 VIEWS LEFT Result Date: 10/28/2023 CLINICAL DATA:  Postop. EXAM: DG HIP (WITH OR WITHOUT PELVIS) 2-3V LEFT COMPARISON:  Preoperative imaging FINDINGS: Left hip arthroplasty in expected alignment. No periprosthetic lucency or fracture. Recent postsurgical change includes air and edema in the soft tissues. IMPRESSION: Left hip arthroplasty without immediate postoperative complication. Electronically Signed   By: Chadwick Colonel M.D.   On: 10/28/2023 17:44   DG Hip Unilat W or Wo Pelvis 2-3 Views Left Result Date: 10/27/2023 CLINICAL DATA:  Status post fall. EXAM: DG HIP (WITH OR WITHOUT PELVIS) 2-3V LEFT COMPARISON:  None Available. FINDINGS: There is an acute, subcapital left femoral neck fracture. The proximal displacement of the distal fracture fragments noted. No dislocation. Right hip appears intact. No signs of pelvic fracture or diastasis. IMPRESSION: Acute, displaced subcapital left femoral neck fracture. Electronically Signed   By:  Kimberley Penman M.D.   On: 10/27/2023 14:08   CT Head Wo Contrast Result Date: 10/27/2023 CLINICAL DATA:  Trauma, trip and fall while gardening. On anticoagulation for atrial fibrillation. EXAM: CT HEAD WITHOUT CONTRAST CT CERVICAL SPINE WITHOUT CONTRAST TECHNIQUE: Multidetector CT imaging of the head and cervical spine was performed following the standard protocol without intravenous contrast. Multiplanar CT image reconstructions of the cervical spine were also generated. RADIATION DOSE REDUCTION: This exam was performed according to  the departmental dose-optimization program which includes automated exposure control, adjustment of the mA and/or kV according to patient size and/or use of iterative reconstruction technique. COMPARISON:  05/14/2022. FINDINGS: CT HEAD FINDINGS Brain: No acute intracranial hemorrhage. No CT evidence of acute infarct. No edema, mass effect, or midline shift. The basilar cisterns are patent. Ventricles: Prominence of the ventricles suggesting underlying parenchymal volume loss. Vascular: Atherosclerotic calcifications of the carotid siphons. Mild focal atherosclerosis of the right M1 segment is slightly increased from prior. No hyperdense vessel. Skull: No acute or aggressive finding. Orbits: Orbits are symmetric. Sinuses: Mucosal thickening in the ethmoid sinuses, slightly greater on the right. Other: Mastoid air cells are clear. CT CERVICAL SPINE FINDINGS Alignment: Alignment is maintained. No listhesis. No facet subluxation or dislocation. Skull base and vertebrae: No acute fracture. No primary bone lesion or focal pathologic process. Soft tissues and spinal canal: No prevertebral fluid or swelling. No visible canal hematoma. Disc levels: Moderate disc space narrowing at multiple levels. Severe disc space narrowing posteriorly at C3-4. Disc osteophyte complexes at multiple levels in the cervical spine without high-grade osseous spinal canal stenosis. Facet arthrosis and uncovertebral hypertrophy at multiple levels. Upper chest: Negative. Other: None. IMPRESSION: No CT evidence of acute intracranial abnormality. No acute fracture or traumatic malalignment of the cervical spine. Generalized parenchymal volume loss. Degenerative changes of the cervical spine as above. Electronically Signed   By: Denny Flack M.D.   On: 10/27/2023 13:54   CT Cervical Spine Wo Contrast Result Date: 10/27/2023 CLINICAL DATA:  Trauma, trip and fall while gardening. On anticoagulation for atrial fibrillation. EXAM: CT HEAD WITHOUT  CONTRAST CT CERVICAL SPINE WITHOUT CONTRAST TECHNIQUE: Multidetector CT imaging of the head and cervical spine was performed following the standard protocol without intravenous contrast. Multiplanar CT image reconstructions of the cervical spine were also generated. RADIATION DOSE REDUCTION: This exam was performed according to the departmental dose-optimization program which includes automated exposure control, adjustment of the mA and/or kV according to patient size and/or use of iterative reconstruction technique. COMPARISON:  05/14/2022. FINDINGS: CT HEAD FINDINGS Brain: No acute intracranial hemorrhage. No CT evidence of acute infarct. No edema, mass effect, or midline shift. The basilar cisterns are patent. Ventricles: Prominence of the ventricles suggesting underlying parenchymal volume loss. Vascular: Atherosclerotic calcifications of the carotid siphons. Mild focal atherosclerosis of the right M1 segment is slightly increased from prior. No hyperdense vessel. Skull: No acute or aggressive finding. Orbits: Orbits are symmetric. Sinuses: Mucosal thickening in the ethmoid sinuses, slightly greater on the right. Other: Mastoid air cells are clear. CT CERVICAL SPINE FINDINGS Alignment: Alignment is maintained. No listhesis. No facet subluxation or dislocation. Skull base and vertebrae: No acute fracture. No primary bone lesion or focal pathologic process. Soft tissues and spinal canal: No prevertebral fluid or swelling. No visible canal hematoma. Disc levels: Moderate disc space narrowing at multiple levels. Severe disc space narrowing posteriorly at C3-4. Disc osteophyte complexes at multiple levels in the cervical spine without high-grade osseous spinal canal stenosis. Facet arthrosis and  uncovertebral hypertrophy at multiple levels. Upper chest: Negative. Other: None. IMPRESSION: No CT evidence of acute intracranial abnormality. No acute fracture or traumatic malalignment of the cervical spine. Generalized  parenchymal volume loss. Degenerative changes of the cervical spine as above. Electronically Signed   By: Denny Flack M.D.   On: 10/27/2023 13:54   Scheduled Meds:  acetaminophen   500 mg Oral Q6H   apixaban   5 mg Oral BID   divalproex   500 mg Oral BID   docusate sodium  100 mg Oral BID   ferrous sulfate  325 mg Oral TID PC   furosemide   40 mg Oral Daily   senna  1 tablet Oral BID   Continuous Infusions:  sodium chloride        LOS: 2 days    Time spent: 35 mins    Magdalene School, MD [facility]   If 7PM-7AM, please contact night-coverage

## 2023-10-29 NOTE — Progress Notes (Signed)
 Patient dangled on the bedside and also stood for a few moments with the walker, denied need for pain medication at this time but said he will use his call bell if he changes his mind.

## 2023-10-29 NOTE — Evaluation (Signed)
 Physical Therapy Evaluation Patient Details Name: Gary Atkins MRN: [id number] DOB: 05-Apr-1941 Today's Date: 10/29/2023  History of Present Illness  83 y.o. male admitted with L femoral neck fx, s/p hip hemiarthoplasty (posterior approach, no precautions) 10/28/23. 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  Clinical Impression  Pt admitted with above diagnosis. Max assist for bed mobility, mod assist sit to stand, pt ambulated 3' with RW, distance limited by pain. Patient will benefit from continued inpatient follow up therapy, <3 hours/day.  Pt currently with functional limitations due to the deficits listed below (see PT Problem List). Pt will benefit from acute skilled PT to increase their independence and safety with mobility to allow discharge.           If plan is discharge home, recommend the following: A lot of help with walking and/or transfers;A lot of help with bathing/dressing/bathroom;Assistance with cooking/housework;Assist for transportation;Help with stairs or ramp for entrance   Can travel by private vehicle   No    Equipment Recommendations Rolling walker (2 wheels)  Recommendations for Other Services       Functional Status Assessment Patient has had a recent decline in their functional status and demonstrates the ability to make significant improvements in function in a reasonable and predictable amount of time.     Precautions / Restrictions Precautions Precautions: Fall Recall of Precautions/Restrictions: Impaired Precaution/Restrictions Comments: pt reported no other falls in past 6 months, wife reported 1 other fall in past 6 months Restrictions Weight Bearing Restrictions Per Provider  Order: No LLE Weight Bearing Per Provider Order: Weight bearing as tolerated      Mobility  Bed Mobility Overal bed mobility: Needs Assistance Bed Mobility: Supine to Sit     Supine to sit: Max assist, HOB elevated     General bed mobility comments: used gait belt as LLE lifter, max A to raise trunk    Transfers Overall transfer level: Needs assistance Equipment used: Rolling walker (2 wheels) Transfers: Sit to/from Stand Sit to Stand: From elevated surface, Mod assist           General transfer comment: assist to power up    Ambulation/Gait Ambulation/Gait assistance: Contact guard assist Gait Distance (Feet): 3 Feet Assistive device: Rolling walker (2 wheels) Gait Pattern/deviations: Step-to pattern, Decreased step length - right, Decreased step length - left Gait velocity: decr     General Gait Details: pt took several steps forwards with RW, distance limited by pain  Stairs            Wheelchair Mobility     Tilt Bed    Modified Rankin (Stroke Patients Only)       Balance Overall balance assessment: Needs assistance Sitting-balance support: Feet supported, No upper extremity supported Sitting balance-Leahy Scale: Fair     Standing balance support: Bilateral upper extremity supported, Reliant on assistive device for balance Standing balance-Leahy Scale: Poor                               Pertinent Vitals/Pain Pain Assessment Pain Assessment: 0-10 Pain Score: 6  Pain Location: L hip Pain Descriptors / Indicators: Sore Pain Intervention(s): Limited activity within patient's tolerance, Monitored during session, Premedicated before session, Ice applied    Home  Living Family/patient expects to be discharged to:: Skilled nursing facility Living Arrangements: Spouse/significant other Available Help at Discharge: Family;Available 24 hours/day Type of Home: House Home Access: Stairs to enter Entrance Stairs-Rails: None Entrance  Stairs-Number of Steps: 2   Home Layout: One level Home Equipment: Rollator (4 wheels)      Prior Function Prior Level of Function : Independent/Modified Independent             Mobility Comments: walks without AD       Extremity/Trunk Assessment   Upper Extremity Assessment Upper Extremity Assessment: Overall WFL for tasks assessed    Lower Extremity Assessment Lower Extremity Assessment: LLE deficits/detail LLE Deficits / Details: knee ext +2/5, hip flexion AAROM ~25* limited by pain, hip abduction AAROM ~15* limited by pain LLE Coordination: WNL    Cervical / Trunk Assessment Cervical / Trunk Assessment: Kyphotic  Communication   Communication Communication: Impaired Factors Affecting Communication: Hearing impaired    Cognition Arousal: Alert Behavior During Therapy: WFL for tasks assessed/performed   PT - Cognitive impairments: Memory                       PT - Cognition Comments: pt reported no other falls in past 6 months, wife reported 1 other fall in past 6 months; wife also clarified type of walker they have at home (pt stated RW, spouse stated rollator) Following commands: Intact       Cueing       General Comments      Exercises General Exercises - Lower Extremity Ankle Circles/Pumps: AROM, Both, 15 reps, Supine Heel Slides: AAROM, Left, 10 reps, Supine Hip ABduction/ADduction: AAROM, Left, 10 reps, Supine   Assessment/Plan    PT Assessment Patient needs continued PT services  PT Problem List Decreased strength;Decreased mobility;Decreased range of motion;Decreased activity tolerance;Decreased balance;Decreased knowledge of use of DME;Pain       PT Treatment Interventions Gait training;Therapeutic exercise;Patient/family education;Therapeutic activities;Functional mobility training;Balance training    PT Goals (Current goals can be found in the Care Plan section)  Acute Rehab PT Goals Patient Stated Goal: improve balance PT  Goal Formulation: With patient/family Time For Goal Achievement: 11/05/23 Potential to Achieve Goals: Good    Frequency Min 3X/week     Co-evaluation               AM-PAC PT "6 Clicks" Mobility  Outcome Measure Help needed turning from your back to your side while in a flat bed without using bedrails?: A Little Help needed moving from lying on your back to sitting on the side of a flat bed without using bedrails?: A Lot Help needed moving to and from a bed to a chair (including a wheelchair)?: A Little Help needed standing up from a chair using your arms (e.g., wheelchair or bedside chair)?: A Lot Help needed to walk in hospital room?: A Little Help needed climbing 3-5 steps with a railing? : Total 6 Click Score: 14    End of Session Equipment Utilized During Treatment: Gait belt Activity Tolerance: Patient tolerated treatment well;Patient limited by pain Patient left: in chair;with chair alarm set;with call bell/phone within reach;with family/visitor present Nurse Communication: Mobility status PT Visit Diagnosis: Difficulty in walking, not elsewhere classified (R26.2);Pain;Unsteadiness on feet (R26.81);Muscle weakness (generalized) (M62.81);History of falling (Z91.81) Pain - Right/Left: Left Pain - part of body: Hip    Time: 4098-1191 PT Time Calculation (min) (ACUTE ONLY): 29 min   Charges:   PT Evaluation $PT Eval Moderate  Complexity: 1 Mod PT Treatments $Gait Training: 8-22 mins PT General Charges $$ ACUTE PT VISIT: 1 Visit        Daymon Evans PT 10/29/2023  Acute Rehabilitation Services  Office [phone]

## 2023-10-29 NOTE — Progress Notes (Signed)
 Subjective: 1 Day Post-Op s/p Procedure(s): HEMIARTHROPLASTY (BIPOLAR) HIP, POSTERIOR APPROACH FOR FRACTURE   Patient is alert, oriented. Patient reports pain as well controlled. Voiding on own. Able to get up with a walker to the commode yesterday.  Denies chest pain, SOB, Calf pain. No nausea/vomiting. No other complaints.    Objective:  PE: VITALS:   Vitals:   10/28/23 2054 10/29/23 0204 10/29/23 0550 10/29/23 0928  BP: 91/76 117/89 94/64 95/67   Pulse: 72 74 72 88  Resp: 15 18 15 16   Temp: 97.6 F (36.4 C) (!) 97.5 F (36.4 C) 98.8 F (37.1 C) 98.6 F (37 C)  TempSrc: Axillary Oral Oral Oral  SpO2: 98% 99% 99% 96%  Weight:      Height:       Alert and oriented Sensation intact distally Intact pulses distally Dorsiflexion/Plantar flexion intact Incision: dressing C/D/I  LABS  Results for orders placed or performed during the hospital encounter of 10/27/23 (from the past 24 hours)  CBC     Status: Abnormal   Collection Time: 10/29/23  3:26 AM  Result Value Ref Range   WBC 8.9 4.0 - 10.5 K/uL   RBC 3.47 (L) 4.22 - 5.81 MIL/uL   Hemoglobin 11.2 (L) 13.0 - 17.0 g/dL   HCT 95.2 (L) 84.1 - 32.4 %   MCV 99.4 80.0 - 100.0 fL   MCH 32.3 26.0 - 34.0 pg   MCHC 32.5 30.0 - 36.0 g/dL   RDW 40.1 02.7 - 25.3 %   Platelets 101 (L) 150 - 400 K/uL   nRBC 0.0 0.0 - 0.2 %  Basic metabolic panel with GFR     Status: Abnormal   Collection Time: 10/29/23  3:26 AM  Result Value Ref Range   Sodium 134 (L) 135 - 145 mmol/L   Potassium 4.4 3.5 - 5.1 mmol/L   Chloride 102 98 - 111 mmol/L   CO2 23 22 - 32 mmol/L   Glucose, Bld 156 (H) 70 - 99 mg/dL   BUN 30 (H) 8 - 23 mg/dL   Creatinine, Ser 6.64 (H) 0.61 - 1.24 mg/dL   Calcium  8.6 (L) 8.9 - 10.3 mg/dL   GFR, Estimated 54 (L) >60 mL/min   Anion gap 9 5 - 15    DG HIP UNILAT W OR W/O PELVIS 2-3 VIEWS LEFT Result Date: 10/28/2023 CLINICAL DATA:  Postop. EXAM: DG HIP (WITH OR WITHOUT PELVIS) 2-3V LEFT COMPARISON:   Preoperative imaging FINDINGS: Left hip arthroplasty in expected alignment. No periprosthetic lucency or fracture. Recent postsurgical change includes air and edema in the soft tissues. IMPRESSION: Left hip arthroplasty without immediate postoperative complication. Electronically Signed   By: Chadwick Colonel M.D.   On: 10/28/2023 17:44   DG Hip Unilat W or Wo Pelvis 2-3 Views Left Result Date: 10/27/2023 CLINICAL DATA:  Status post fall. EXAM: DG HIP (WITH OR WITHOUT PELVIS) 2-3V LEFT COMPARISON:  None Available. FINDINGS: There is an acute, subcapital left femoral neck fracture. The proximal displacement of the distal fracture fragments noted. No dislocation. Right hip appears intact. No signs of pelvic fracture or diastasis. IMPRESSION: Acute, displaced subcapital left femoral neck fracture. Electronically Signed   By: Kimberley Penman M.D.   On: 10/27/2023 14:08   CT Head Wo Contrast Result Date: 10/27/2023 CLINICAL DATA:  Trauma, trip and fall while gardening. On anticoagulation for atrial fibrillation. EXAM: CT HEAD WITHOUT CONTRAST CT CERVICAL SPINE WITHOUT CONTRAST TECHNIQUE: Multidetector CT imaging of the head and cervical spine was  performed following the standard protocol without intravenous contrast. Multiplanar CT image reconstructions of the cervical spine were also generated. RADIATION DOSE REDUCTION: This exam was performed according to the departmental dose-optimization program which includes automated exposure control, adjustment of the mA and/or kV according to patient size and/or use of iterative reconstruction technique. COMPARISON:  05/14/2022. FINDINGS: CT HEAD FINDINGS Brain: No acute intracranial hemorrhage. No CT evidence of acute infarct. No edema, mass effect, or midline shift. The basilar cisterns are patent. Ventricles: Prominence of the ventricles suggesting underlying parenchymal volume loss. Vascular: Atherosclerotic calcifications of the carotid siphons. Mild focal  atherosclerosis of the right M1 segment is slightly increased from prior. No hyperdense vessel. Skull: No acute or aggressive finding. Orbits: Orbits are symmetric. Sinuses: Mucosal thickening in the ethmoid sinuses, slightly greater on the right. Other: Mastoid air cells are clear. CT CERVICAL SPINE FINDINGS Alignment: Alignment is maintained. No listhesis. No facet subluxation or dislocation. Skull base and vertebrae: No acute fracture. No primary bone lesion or focal pathologic process. Soft tissues and spinal canal: No prevertebral fluid or swelling. No visible canal hematoma. Disc levels: Moderate disc space narrowing at multiple levels. Severe disc space narrowing posteriorly at C3-4. Disc osteophyte complexes at multiple levels in the cervical spine without high-grade osseous spinal canal stenosis. Facet arthrosis and uncovertebral hypertrophy at multiple levels. Upper chest: Negative. Other: None. IMPRESSION: No CT evidence of acute intracranial abnormality. No acute fracture or traumatic malalignment of the cervical spine. Generalized parenchymal volume loss. Degenerative changes of the cervical spine as above. Electronically Signed   By: Denny Flack M.D.   On: 10/27/2023 13:54   CT Cervical Spine Wo Contrast Result Date: 10/27/2023 CLINICAL DATA:  Trauma, trip and fall while gardening. On anticoagulation for atrial fibrillation. EXAM: CT HEAD WITHOUT CONTRAST CT CERVICAL SPINE WITHOUT CONTRAST TECHNIQUE: Multidetector CT imaging of the head and cervical spine was performed following the standard protocol without intravenous contrast. Multiplanar CT image reconstructions of the cervical spine were also generated. RADIATION DOSE REDUCTION: This exam was performed according to the departmental dose-optimization program which includes automated exposure control, adjustment of the mA and/or kV according to patient size and/or use of iterative reconstruction technique. COMPARISON:  05/14/2022. FINDINGS: CT  HEAD FINDINGS Brain: No acute intracranial hemorrhage. No CT evidence of acute infarct. No edema, mass effect, or midline shift. The basilar cisterns are patent. Ventricles: Prominence of the ventricles suggesting underlying parenchymal volume loss. Vascular: Atherosclerotic calcifications of the carotid siphons. Mild focal atherosclerosis of the right M1 segment is slightly increased from prior. No hyperdense vessel. Skull: No acute or aggressive finding. Orbits: Orbits are symmetric. Sinuses: Mucosal thickening in the ethmoid sinuses, slightly greater on the right. Other: Mastoid air cells are clear. CT CERVICAL SPINE FINDINGS Alignment: Alignment is maintained. No listhesis. No facet subluxation or dislocation. Skull base and vertebrae: No acute fracture. No primary bone lesion or focal pathologic process. Soft tissues and spinal canal: No prevertebral fluid or swelling. No visible canal hematoma. Disc levels: Moderate disc space narrowing at multiple levels. Severe disc space narrowing posteriorly at C3-4. Disc osteophyte complexes at multiple levels in the cervical spine without high-grade osseous spinal canal stenosis. Facet arthrosis and uncovertebral hypertrophy at multiple levels. Upper chest: Negative. Other: None. IMPRESSION: No CT evidence of acute intracranial abnormality. No acute fracture or traumatic malalignment of the cervical spine. Generalized parenchymal volume loss. Degenerative changes of the cervical spine as above. Electronically Signed   By: Denny Flack M.D.   On: 10/27/2023  13:54    Assessment/Plan: Principal Problem:   Femur fracture, left (HCC) Active Problems:   CAD (coronary artery disease)   NICM (nonischemic cardiomyopathy) (HCC)   Paroxysmal ventricular tachycardia (HCC)   PVC's (premature ventricular contractions)   Osteoporosis   Seizure (HCC)   Chronic combined systolic and diastolic heart failure (HCC)   Nonobstructive atherosclerosis of coronary artery   Atrial  fibrillation (HCC)   1 Day Post-Op s/p Procedure(s): HEMIARTHROPLASTY (BIPOLAR) HIP, POSTERIOR APPROACH FOR FRACTURE  Weightbearing: WBAT LLE Insicional and dressing care: Reinforce dressings as needed VTE prophylaxis: Hbg 11.2, home Eliquis  restarted Pain control: continue current regimen Follow - up plan: 2 weeks with Agatha Horsfall Dispo: pending PT/OT evals, may need SNF placement   Contact information:   Hurshel Maidens, PA-C Weekdays 8-5  After hours and holidays please check [email or web address] for group call information for [facility]  Abraham Hoffmann 10/29/2023, 9:43 AM

## 2023-10-30 DIAGNOSIS — I5042 Chronic combined systolic (congestive) and diastolic (congestive) heart failure: Secondary | ICD-10-CM | POA: Diagnosis present

## 2023-10-30 DIAGNOSIS — I4891 Unspecified atrial fibrillation: Secondary | ICD-10-CM | POA: Diagnosis not present

## 2023-10-30 DIAGNOSIS — R231 Pallor: Secondary | ICD-10-CM | POA: Diagnosis not present

## 2023-10-30 DIAGNOSIS — I2510 Atherosclerotic heart disease of native coronary artery without angina pectoris: Secondary | ICD-10-CM | POA: Diagnosis not present

## 2023-10-30 DIAGNOSIS — I110 Hypertensive heart disease with heart failure: Secondary | ICD-10-CM | POA: Diagnosis present

## 2023-10-30 DIAGNOSIS — E861 Hypovolemia: Secondary | ICD-10-CM | POA: Diagnosis present

## 2023-10-30 DIAGNOSIS — R278 Other lack of coordination: Secondary | ICD-10-CM | POA: Diagnosis not present

## 2023-10-30 DIAGNOSIS — M810 Age-related osteoporosis without current pathological fracture: Secondary | ICD-10-CM | POA: Diagnosis present

## 2023-10-30 DIAGNOSIS — G40909 Epilepsy, unspecified, not intractable, without status epilepticus: Secondary | ICD-10-CM | POA: Diagnosis present

## 2023-10-30 DIAGNOSIS — L89156 Pressure-induced deep tissue damage of sacral region: Secondary | ICD-10-CM | POA: Diagnosis not present

## 2023-10-30 DIAGNOSIS — S72042D Displaced fracture of base of neck of left femur, subsequent encounter for closed fracture with routine healing: Secondary | ICD-10-CM | POA: Diagnosis not present

## 2023-10-30 DIAGNOSIS — I771 Stricture of artery: Secondary | ICD-10-CM | POA: Diagnosis not present

## 2023-10-30 DIAGNOSIS — R0602 Shortness of breath: Secondary | ICD-10-CM | POA: Diagnosis not present

## 2023-10-30 DIAGNOSIS — Z7401 Bed confinement status: Secondary | ICD-10-CM | POA: Diagnosis not present

## 2023-10-30 DIAGNOSIS — Z743 Need for continuous supervision: Secondary | ICD-10-CM | POA: Diagnosis not present

## 2023-10-30 DIAGNOSIS — R262 Difficulty in walking, not elsewhere classified: Secondary | ICD-10-CM | POA: Diagnosis not present

## 2023-10-30 DIAGNOSIS — I959 Hypotension, unspecified: Secondary | ICD-10-CM | POA: Diagnosis not present

## 2023-10-30 DIAGNOSIS — L89002 Pressure ulcer of unspecified elbow, stage 2: Secondary | ICD-10-CM | POA: Diagnosis not present

## 2023-10-30 DIAGNOSIS — G4089 Other seizures: Secondary | ICD-10-CM | POA: Diagnosis not present

## 2023-10-30 DIAGNOSIS — E872 Acidosis, unspecified: Secondary | ICD-10-CM | POA: Diagnosis not present

## 2023-10-30 DIAGNOSIS — R0902 Hypoxemia: Secondary | ICD-10-CM | POA: Diagnosis not present

## 2023-10-30 DIAGNOSIS — S7292XD Unspecified fracture of left femur, subsequent encounter for closed fracture with routine healing: Secondary | ICD-10-CM | POA: Diagnosis not present

## 2023-10-30 DIAGNOSIS — E785 Hyperlipidemia, unspecified: Secondary | ICD-10-CM | POA: Diagnosis present

## 2023-10-30 DIAGNOSIS — H8111 Benign paroxysmal vertigo, right ear: Secondary | ICD-10-CM | POA: Diagnosis not present

## 2023-10-30 DIAGNOSIS — I428 Other cardiomyopathies: Secondary | ICD-10-CM | POA: Diagnosis present

## 2023-10-30 DIAGNOSIS — I499 Cardiac arrhythmia, unspecified: Secondary | ICD-10-CM | POA: Diagnosis not present

## 2023-10-30 DIAGNOSIS — Z96642 Presence of left artificial hip joint: Secondary | ICD-10-CM | POA: Diagnosis present

## 2023-10-30 DIAGNOSIS — K219 Gastro-esophageal reflux disease without esophagitis: Secondary | ICD-10-CM | POA: Diagnosis present

## 2023-10-30 DIAGNOSIS — I714 Abdominal aortic aneurysm, without rupture, unspecified: Secondary | ICD-10-CM | POA: Diagnosis present

## 2023-10-30 DIAGNOSIS — S72012A Unspecified intracapsular fracture of left femur, initial encounter for closed fracture: Secondary | ICD-10-CM | POA: Diagnosis not present

## 2023-10-30 DIAGNOSIS — R197 Diarrhea, unspecified: Secondary | ICD-10-CM | POA: Diagnosis not present

## 2023-10-30 DIAGNOSIS — I472 Ventricular tachycardia, unspecified: Secondary | ICD-10-CM | POA: Diagnosis not present

## 2023-10-30 DIAGNOSIS — Z950 Presence of cardiac pacemaker: Secondary | ICD-10-CM | POA: Diagnosis not present

## 2023-10-30 DIAGNOSIS — Z8041 Family history of malignant neoplasm of ovary: Secondary | ICD-10-CM | POA: Diagnosis not present

## 2023-10-30 DIAGNOSIS — S79929A Unspecified injury of unspecified thigh, initial encounter: Secondary | ICD-10-CM | POA: Diagnosis not present

## 2023-10-30 DIAGNOSIS — J9811 Atelectasis: Secondary | ICD-10-CM | POA: Diagnosis not present

## 2023-10-30 DIAGNOSIS — E869 Volume depletion, unspecified: Secondary | ICD-10-CM | POA: Diagnosis present

## 2023-10-30 DIAGNOSIS — L89152 Pressure ulcer of sacral region, stage 2: Secondary | ICD-10-CM | POA: Diagnosis present

## 2023-10-30 DIAGNOSIS — R531 Weakness: Secondary | ICD-10-CM | POA: Diagnosis not present

## 2023-10-30 DIAGNOSIS — M6281 Muscle weakness (generalized): Secondary | ICD-10-CM | POA: Diagnosis not present

## 2023-10-30 DIAGNOSIS — I700 Atherosclerosis of aorta: Secondary | ICD-10-CM | POA: Diagnosis not present

## 2023-10-30 DIAGNOSIS — R9431 Abnormal electrocardiogram [ECG] [EKG]: Secondary | ICD-10-CM | POA: Diagnosis not present

## 2023-10-30 DIAGNOSIS — I480 Paroxysmal atrial fibrillation: Secondary | ICD-10-CM | POA: Diagnosis present

## 2023-10-30 DIAGNOSIS — Z6824 Body mass index (BMI) 24.0-24.9, adult: Secondary | ICD-10-CM | POA: Diagnosis not present

## 2023-10-30 DIAGNOSIS — E440 Moderate protein-calorie malnutrition: Secondary | ICD-10-CM | POA: Diagnosis present

## 2023-10-30 DIAGNOSIS — Z7901 Long term (current) use of anticoagulants: Secondary | ICD-10-CM | POA: Diagnosis not present

## 2023-10-30 DIAGNOSIS — W19XXXD Unspecified fall, subsequent encounter: Secondary | ICD-10-CM | POA: Diagnosis present

## 2023-10-30 DIAGNOSIS — Z860100 Personal history of colon polyps, unspecified: Secondary | ICD-10-CM | POA: Diagnosis not present

## 2023-10-30 DIAGNOSIS — I719 Aortic aneurysm of unspecified site, without rupture: Secondary | ICD-10-CM | POA: Diagnosis not present

## 2023-10-30 DIAGNOSIS — Z825 Family history of asthma and other chronic lower respiratory diseases: Secondary | ICD-10-CM | POA: Diagnosis not present

## 2023-10-30 LAB — CBC
HCT: 29.9 % — ABNORMAL LOW (ref 39.0–52.0)
Hemoglobin: 9.7 g/dL — ABNORMAL LOW (ref 13.0–17.0)
MCH: 32.7 pg (ref 26.0–34.0)
MCHC: 32.4 g/dL (ref 30.0–36.0)
MCV: 100.7 fL — ABNORMAL HIGH (ref 80.0–100.0)
Platelets: 88 10*3/uL — ABNORMAL LOW (ref 150–400)
RBC: 2.97 MIL/uL — ABNORMAL LOW (ref 4.22–5.81)
RDW: 13.5 % (ref 11.5–15.5)
WBC: 8.3 10*3/uL (ref 4.0–10.5)
nRBC: 0 % (ref 0.0–0.2)

## 2023-10-30 LAB — BASIC METABOLIC PANEL WITH GFR
Anion gap: 6 (ref 5–15)
BUN: 32 mg/dL — ABNORMAL HIGH (ref 8–23)
CO2: 22 mmol/L (ref 22–32)
Calcium: 8.1 mg/dL — ABNORMAL LOW (ref 8.9–10.3)
Chloride: 106 mmol/L (ref 98–111)
Creatinine, Ser: 0.96 mg/dL (ref 0.61–1.24)
GFR, Estimated: >60 mL/min (ref >60)
Glucose, Bld: 92 mg/dL (ref 70–99)
Potassium: 3.8 mmol/L (ref 3.5–5.1)
Sodium: 134 mmol/L — ABNORMAL LOW (ref 135–145)

## 2023-10-30 MED ORDER — HYDROCODONE-ACETAMINOPHEN 7.5-325 MG PO TABS: 1.0000 | ORAL_TABLET | ORAL | 0 refills | Status: AC | PRN

## 2023-10-30 MED ORDER — SODIUM CHLORIDE 0.9 % IV BOLUS
500.0000 mL | Freq: Once | INTRAVENOUS | Status: AC
  Administered 2023-10-30: 500 mL via INTRAVENOUS

## 2023-10-30 MED ORDER — SENNA 8.6 MG PO TABS: 1.0000 | ORAL_TABLET | Freq: Two times a day (BID) | ORAL | 0 refills | Status: DC

## 2023-10-30 MED ORDER — FERROUS SULFATE 325 (65 FE) MG PO TABS: 325.0000 mg | ORAL_TABLET | Freq: Three times a day (TID) | ORAL | 0 refills | Status: DC

## 2023-10-30 NOTE — Discharge Summary (Signed)
 Physician Discharge Summary  Gary Atkins JWJ:[id number] DOB: 1940-11-29 DOA: 10/27/2023  PCP: Roslyn Coombe, MD  Admit date: 10/27/2023  Discharge date: 10/30/2023  Admitted From: Home  Disposition:  SNF([facility])  Recommendations for Outpatient Follow-up:  Follow up with PCP in 1-2 weeks. Please obtain BMP/CBC in one week. Advised to follow-up with orthopedics Dr. Abbott Hockey in 1 week. Advised to take Eliquis  as prescribed. Patient is status post ORIF for left hip fracture.  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 Patient reports he tripped over the rack in the yard with subsequent left hip pain. In the ED imaging confirms left subcapital femoral neck fracture. Patient was admitted for further evaluation.  Orthopedics was consulted. Patient underwent ORIF.  Postoperative day 2.  Tolerated well.  Patient resumed on Eliquis  postoperatively.  Pain was adequately controlled.  PT and OT recommended skilled nursing facility. Orthopedics recommended outpatient follow-up orthopedics in 2 weeks. Patient is being discharged to SNF for rehabilitation.   Discharge Diagnoses:  Principal Problem:   Femur fracture, left (HCC) Active Problems:   CAD (coronary artery disease)   NICM (nonischemic cardiomyopathy) (HCC)   Paroxysmal ventricular tachycardia (HCC)   PVC's (premature ventricular contractions)   Osteoporosis   Seizure (HCC)   Chronic combined systolic and diastolic heart failure (HCC)    Nonobstructive atherosclerosis of coronary artery   Atrial fibrillation (HCC)  Left femoral neck fracture: Patient presented status post mechanical fall, with history of osteoporosis. Imaging confirms left subcapital femoral neck fracture. Orthopedic consulted, status post ORIF, POD #2. Eliquis  resumed postoperatively. PT and OT postoperatively recommended SNF. Continue adequate pain control with Dilaudid and oxycodone  as needed.   Combined systolic and diastolic heart failure: Nonobstructive CAD /nonischemic cardiomyopathy AVNRT status post ablation and pacemaker placement Paroxysmal A-fib Currently without exacerbation. Denies any chest pain or shortness of breath. Denies any signs of ACS. Heart rate is well-controlled.  Eliquis  resumed postoperatively. Resume Lasix , all other home medications.   Seizure disorder Continue Depakote .  Well-controlled.   AKI: > Resolved. Likely due to decreased intake due to surgery. Renal Functions back to normal with IV hydration.  Discharge Instructions  Discharge Instructions     Call MD for:  difficulty breathing, headache or visual disturbances   Complete by: As directed    Call MD for:  persistant dizziness or light-headedness   Complete by: As directed    Call MD for:  persistant nausea and vomiting   Complete by: As directed    Diet - low sodium heart healthy   Complete by: As directed    Diet Carb Modified   Complete by: As directed    Discharge instructions   Complete by: As directed    Advised to follow-up with primary care physician in 1 week. Advised to follow-up with orthopedics Dr. Abbott Hockey in 1 week. Advised to take Eliquis  as prescribed. Patient is status post ORIF for left hip fracture.   Increase activity slowly   Complete by: As directed       Allergies as of 10/30/2023       Reactions   Demerol [meperidine]  Other (See Comments)   seizure   Tramadol  Other (See Comments)   Seizure        Medication List      STOP taking these medications    HYDROcodone -acetaminophen  10-325 MG tablet Commonly known as: NORCO Replaced by: HYDROcodone -acetaminophen  7.5-325 MG tablet   traMADol  50 MG tablet Commonly known as: ULTRAM    zinc gluconate 50 MG tablet       TAKE these medications    augmented betamethasone dipropionate 0.05 % cream Commonly known as: DIPROLENE-AF Apply 1 application  topically 2 (two) times daily as needed (irritation).   B-12 PO Take 2 tablets by mouth daily.   divalproex  500 MG 24 hr tablet Commonly known as: DEPAKOTE  ER Take 1 tablet by mouth twice daily   Eliquis  5 MG Tabs tablet Generic drug: apixaban  Take 1 tablet by mouth twice daily   ferrous sulfate 325 (65 FE) MG tablet Take 1 tablet (325 mg total) by mouth 3 (three) times daily after meals.   furosemide  40 MG tablet Commonly known as: LASIX  Take 1 tablet (40 mg total) by mouth daily.   HYDROcodone -acetaminophen  7.5-325 MG tablet Commonly known as: NORCO Take 1-2 tablets by mouth every 4 (four) hours as needed for up to 3 days for severe pain (pain score 7-10). Replaces: HYDROcodone -acetaminophen  10-325 MG tablet   latanoprost 0.005 % ophthalmic solution Commonly known as: XALATAN Place 1 drop into both eyes at bedtime.   lisinopril  5 MG tablet Commonly known as: ZESTRIL  Take 0.5 tablets (2.5 mg total) by mouth at bedtime.   NON FORMULARY Apply 1 application  topically daily as needed (nail fungus). Antifungal toenail solution from [place], faxed on 01/14/2019   PRESERVISION AREDS PO Take 1 tablet by mouth daily.   senna 8.6 MG Tabs tablet Commonly known as: SENOKOT Take 1 tablet (8.6 mg total) by mouth 2 (two) times daily.        Contact information for follow-up providers     Osa Blase, MD. Schedule an appointment as soon as possible for a visit in 2 week(s).   Specialty: Orthopedic Surgery Contact information: [address] [place]  [address] [phone]         Roslyn Coombe, MD Follow up in 1 week(s).   Specialties: Internal Medicine, Radiology Contact information: [address] [place] [address] [phone]              Contact information for after-discharge care     Destination     [facility] Preferred SNF .   Service: Skilled Nursing Contact information: [address] [place]  [address] [phone]                    Allergies  Allergen Reactions   Demerol [Meperidine] Other (See Comments)    seizure   Tramadol  Other (See Comments)    Seizure    Consultations: [facility]   Procedures/Studies: CUP PACEART REMOTE DEVICE CHECK Result Date: 10/29/2023 ILR summary report received. Battery status OK. Normal device function. No new symptom, tachy, brady, or pause episodes. No new AF episodes. Monthly summary reports and ROV/PRN  CS, CVRS  DG HIP UNILAT W OR W/O PELVIS 2-3 VIEWS LEFT Result Date: 10/28/2023 CLINICAL DATA:  Postop. EXAM: DG HIP (WITH OR WITHOUT PELVIS) 2-3V LEFT COMPARISON:  Preoperative imaging FINDINGS: Left hip arthroplasty in expected alignment. No periprosthetic lucency or fracture. Recent postsurgical change includes air and edema in the soft tissues. IMPRESSION: Left hip  arthroplasty without immediate postoperative complication. Electronically Signed   By: Chadwick Colonel M.D.   On: 10/28/2023 17:44   DG Hip Unilat W or Wo Pelvis 2-3 Views Left Result Date: 10/27/2023 CLINICAL DATA:  Status post fall. EXAM: DG HIP (WITH OR WITHOUT PELVIS) 2-3V LEFT COMPARISON:  None Available. FINDINGS: There is an acute, subcapital left femoral neck fracture. The proximal displacement of the distal fracture fragments noted. No dislocation. Right hip appears intact. No signs of pelvic fracture or diastasis. IMPRESSION: Acute, displaced subcapital left femoral neck fracture. Electronically Signed   By: Kimberley Penman M.D.   On: 10/27/2023 14:08    CT Head Wo Contrast Result Date: 10/27/2023 CLINICAL DATA:  Trauma, trip and fall while gardening. On anticoagulation for atrial fibrillation. EXAM: CT HEAD WITHOUT CONTRAST CT CERVICAL SPINE WITHOUT CONTRAST TECHNIQUE: Multidetector CT imaging of the head and cervical spine was performed following the standard protocol without intravenous contrast. Multiplanar CT image reconstructions of the cervical spine were also generated. RADIATION DOSE REDUCTION: This exam was performed according to the departmental dose-optimization program which includes automated exposure control, adjustment of the mA and/or kV according to patient size and/or use of iterative reconstruction technique. COMPARISON:  05/14/2022. FINDINGS: CT HEAD FINDINGS Brain: No acute intracranial hemorrhage. No CT evidence of acute infarct. No edema, mass effect, or midline shift. The basilar cisterns are patent. Ventricles: Prominence of the ventricles suggesting underlying parenchymal volume loss. Vascular: Atherosclerotic calcifications of the carotid siphons. Mild focal atherosclerosis of the right M1 segment is slightly increased from prior. No hyperdense vessel. Skull: No acute or aggressive finding. Orbits: Orbits are symmetric. Sinuses: Mucosal thickening in the ethmoid sinuses, slightly greater on the right. Other: Mastoid air cells are clear. CT CERVICAL SPINE FINDINGS Alignment: Alignment is maintained. No listhesis. No facet subluxation or dislocation. Skull base and vertebrae: No acute fracture. No primary bone lesion or focal pathologic process. Soft tissues and spinal canal: No prevertebral fluid or swelling. No visible canal hematoma. Disc levels: Moderate disc space narrowing at multiple levels. Severe disc space narrowing posteriorly at C3-4. Disc osteophyte complexes at multiple levels in the cervical spine without high-grade osseous spinal canal stenosis. Facet arthrosis and uncovertebral hypertrophy at multiple levels. Upper  chest: Negative. Other: None. IMPRESSION: No CT evidence of acute intracranial abnormality. No acute fracture or traumatic malalignment of the cervical spine. Generalized parenchymal volume loss. Degenerative changes of the cervical spine as above. Electronically Signed   By: Denny Flack M.D.   On: 10/27/2023 13:54   CT Cervical Spine Wo Contrast Result Date: 10/27/2023 CLINICAL DATA:  Trauma, trip and fall while gardening. On anticoagulation for atrial fibrillation. EXAM: CT HEAD WITHOUT CONTRAST CT CERVICAL SPINE WITHOUT CONTRAST TECHNIQUE: Multidetector CT imaging of the head and cervical spine was performed following the standard protocol without intravenous contrast. Multiplanar CT image reconstructions of the cervical spine were also generated. RADIATION DOSE REDUCTION: This exam was performed according to the departmental dose-optimization program which includes automated exposure control, adjustment of the mA and/or kV according to patient size and/or use of iterative reconstruction technique. COMPARISON:  05/14/2022. FINDINGS: CT HEAD FINDINGS Brain: No acute intracranial hemorrhage. No CT evidence of acute infarct. No edema, mass effect, or midline shift. The basilar cisterns are patent. Ventricles: Prominence of the ventricles suggesting underlying parenchymal volume loss. Vascular: Atherosclerotic calcifications of the carotid siphons. Mild focal atherosclerosis of the right M1 segment is slightly increased from prior. No hyperdense vessel. Skull: No acute or aggressive finding. Orbits: Orbits are  symmetric. Sinuses: Mucosal thickening in the ethmoid sinuses, slightly greater on the right. Other: Mastoid air cells are clear. CT CERVICAL SPINE FINDINGS Alignment: Alignment is maintained. No listhesis. No facet subluxation or dislocation. Skull base and vertebrae: No acute fracture. No primary bone lesion or focal pathologic process. Soft tissues and spinal canal: No prevertebral fluid or swelling. No  visible canal hematoma. Disc levels: Moderate disc space narrowing at multiple levels. Severe disc space narrowing posteriorly at C3-4. Disc osteophyte complexes at multiple levels in the cervical spine without high-grade osseous spinal canal stenosis. Facet arthrosis and uncovertebral hypertrophy at multiple levels. Upper chest: Negative. Other: None. IMPRESSION: No CT evidence of acute intracranial abnormality. No acute fracture or traumatic malalignment of the cervical spine. Generalized parenchymal volume loss. Degenerative changes of the cervical spine as above. Electronically Signed   By: Denny Flack M.D.   On: 10/27/2023 13:54    Subjective: Patient was seen and examined at bedside.  Overnight events noted. Patient reports doing much better, feels better and wants to be discharged to rehab.  Discharge Exam: Vitals:   10/29/23 2136 10/30/23 0530  BP: 106/62 96/62  Pulse: 82 81  Resp: 18 18  Temp: 98.3 F (36.8 C) 99 F (37.2 C)  SpO2: 98% 100%   Vitals:   10/29/23 1335 10/29/23 1734 10/29/23 2136 10/30/23 0530  BP: (!) 83/52 (!) 88/53 106/62 96/62  Pulse: 86  82 81  Resp:   18 18  Temp: 97.7 F (36.5 C)  98.3 F (36.8 C) 99 F (37.2 C)  TempSrc: Oral  Oral Oral  SpO2:   98% 100%  Weight:      Height:        General: Pt is alert, awake, not in acute distress Cardiovascular: RRR, S1/S2 +, no rubs, no gallops Respiratory: CTA bilaterally, no wheezing, no rhonchi Abdominal: Soft, NT, ND, bowel sounds + Extremities: no edema, no cyanosis    The results of significant diagnostics from this hospitalization (including imaging, microbiology, ancillary and laboratory) are listed below for reference.     Microbiology: No results found for this or any previous visit (from the past 240 hours).   Labs: BNP (last 3 results) No results for input(s): "BNP" in the last 8760 hours. Basic Metabolic Panel: Recent Labs  Lab 10/27/23 1325 10/28/23 0330 10/29/23 0326  10/30/23 0331  NA 135 136 134* 134*  K 4.4 4.2 4.4 3.8  CL 105 105 102 106  CO2 19* 22 23 22   GLUCOSE 116* 154* 156* 92  BUN 24* 25* 30* 32*  CREATININE 1.10 1.15 1.31* 0.96  CALCIUM  9.0 8.7* 8.6* 8.1*   Liver Function Tests: No results for input(s): "AST", "ALT", "ALKPHOS", "BILITOT", "PROT", "ALBUMIN" in the last 168 hours. No results for input(s): "LIPASE", "AMYLASE" in the last 168 hours. No results for input(s): "AMMONIA" in the last 168 hours. CBC: Recent Labs  Lab 10/27/23 1325 10/28/23 0330 10/29/23 0326 10/30/23 0331  WBC 7.4 7.3 8.9 8.3  HGB 14.6 13.5 11.2* 9.7*  HCT 42.6 40.8 34.5* 29.9*  MCV 94.9 98.8 99.4 100.7*  PLT 133* 122* 101* 88*   Cardiac Enzymes: No results for input(s): "CKTOTAL", "CKMB", "CKMBINDEX", "TROPONINI" in the last 168 hours. BNP: Invalid input(s): "POCBNP" CBG: No results for input(s): "GLUCAP" in the last 168 hours. D-Dimer No results for input(s): "DDIMER" in the last 72 hours. Hgb A1c No results for input(s): "HGBA1C" in the last 72 hours. Lipid Profile No results for input(s): "CHOL", "HDL", "LDLCALC", "TRIG", "  CHOLHDL", "LDLDIRECT" in the last 72 hours. Thyroid  function studies No results for input(s): "TSH", "T4TOTAL", "T3FREE", "THYROIDAB" in the last 72 hours.  Invalid input(s): "FREET3" Anemia work up No results for input(s): "VITAMINB12", "FOLATE", "FERRITIN", "TIBC", "IRON", "RETICCTPCT" in the last 72 hours. Urinalysis    Component Value Date/Time   COLORURINE YELLOW 06/21/2023 0909   APPEARANCEUR CLEAR 06/21/2023 0909   LABSPEC 1.020 06/21/2023 0909   PHURINE 6.0 06/21/2023 0909   GLUCOSEU NEGATIVE 06/21/2023 0909   HGBUR NEGATIVE 06/21/2023 0909   BILIRUBINUR NEGATIVE 06/21/2023 0909   KETONESUR TRACE (A) 06/21/2023 0909   PROTEINUR 30 (A) 03/24/2018 0121   UROBILINOGEN 0.2 06/21/2023 0909   NITRITE NEGATIVE 06/21/2023 0909   LEUKOCYTESUR NEGATIVE 06/21/2023 0909   Sepsis Labs Recent Labs  Lab  10/27/23 1325 10/28/23 0330 10/29/23 0326 10/30/23 0331  WBC 7.4 7.3 8.9 8.3   Microbiology No results found for this or any previous visit (from the past 240 hours).   Time coordinating discharge: Over 30 minutes  SIGNED:   Magdalene School, MD  [facility] 10/30/2023, 11:14 AM Pager   If 7PM-7AM, please contact night-coverage

## 2023-10-30 NOTE — TOC Transition Note (Signed)
 Transition of Care [facility]) - Discharge Note   Patient Details  Name: Gary Atkins MRN: [id number] Date of Birth: 07-25-1940  Transition of Care [facility]) CM/SW Contact:  Jonni Nettle, LCSW Phone Number: 10/30/2023, 11:47 AM   Clinical Narrative:    CSW met with pt at bedside to discuss discharge plans. Pt to transfer to [facility] for short-term rehabilitation. D/C summary and SNF transfer report sent to [facility] at [facility]. Pt accepted to room 504B. RN to call report to [phone]. D/C packet with signed prescription placed in pt chart at RN station. PTAR called at 11:40 AM. No further TOC needs at this time.    Final next level of care: Skilled Nursing Facility [facility]) Barriers to Discharge: Barriers Resolved   Patient Goals and CMS Choice Patient states their goals for this hospitalization and ongoing recovery are:: return home follow rehab CMS Medicare.gov Compare Post Acute Care list provided to:: Patient Choice offered to / list presented to : Patient [place] ownership interest in [facility].provided to:: Patient    Discharge Placement          Patient chooses bed at: [facility] Patient to be transferred to facility by: PTAR Name of family member notified: Lanna Pitt, pt's wife Patient and family notified of of transfer: 10/30/23  Discharge Plan and Services Additional resources added to the After Visit Summary for [facility] for SNF rehab In-house Referral: Clinical Social Work   Post Acute Care Choice: Skilled Nursing Facility          DME Arranged: N/A DME Agency: NA     Social Drivers of Health (SDOH) Interventions SDOH Screenings   Food Insecurity: No Food Insecurity (10/27/2023)  Housing: Low Risk  (10/27/2023)  Transportation Needs: No Transportation Needs (10/27/2023)  Utilities: Not At Risk (10/27/2023)  Alcohol Screen: Low Risk  (02/15/2023)  Depression (PHQ2-9): Low Risk  (06/21/2023)  Financial Resource Strain: Low Risk   (02/15/2023)  Physical Activity: Insufficiently Active (02/15/2023)  Social Connections: Socially Isolated (10/27/2023)  Stress: No Stress Concern Present (02/15/2023)  Tobacco Use: Low Risk  (10/27/2023)  Health Literacy: Adequate Health Literacy (02/15/2023)    Readmission Risk Interventions    10/29/2023    3:08 PM  Readmission Risk Prevention Plan  Transportation Screening Complete  PCP or Specialist Appt within 5-7 Days Complete  Home Care Screening Complete  Medication Review (RN CM) Complete    [place], MSW, LCSW 10/30/2023 11:50 AM

## 2023-10-30 NOTE — Progress Notes (Signed)
 Subjective: 2 Days Post-Op s/p Procedure(s): HEMIARTHROPLASTY (BIPOLAR) HIP, POSTERIOR APPROACH FOR FRACTURE   Patient is alert, oriented. Patient reports pain as well controlled. Voiding on own. Able to get up with a walker to the commode yesterday.  Denies chest pain, SOB, Calf pain. No nausea/vomiting. No other complaints.    Objective:  PE: VITALS:   Vitals:   10/29/23 1335 10/29/23 1734 10/29/23 2136 10/30/23 0530  BP: (!) 83/52 (!) 88/53 106/62 96/62  Pulse: 86  82 81  Resp:   18 18  Temp: 97.7 F (36.5 C)  98.3 F (36.8 C) 99 F (37.2 C)  TempSrc: Oral  Oral Oral  SpO2:   98% 100%  Weight:      Height:       Alert and oriented Sensation intact distally Intact pulses distally Dorsiflexion/Plantar flexion intact Incision: dressing C/D/I  LABS  Results for orders placed or performed during the hospital encounter of 10/27/23 (from the past 24 hours)  CBC     Status: Abnormal   Collection Time: 10/30/23  3:31 AM  Result Value Ref Range   WBC 8.3 4.0 - 10.5 K/uL   RBC 2.97 (L) 4.22 - 5.81 MIL/uL   Hemoglobin 9.7 (L) 13.0 - 17.0 g/dL   HCT 14.7 (L) 82.9 - 56.2 %   MCV 100.7 (H) 80.0 - 100.0 fL   MCH 32.7 26.0 - 34.0 pg   MCHC 32.4 30.0 - 36.0 g/dL   RDW 13.0 86.5 - 78.4 %   Platelets 88 (L) 150 - 400 K/uL   nRBC 0.0 0.0 - 0.2 %  Basic metabolic panel with GFR     Status: Abnormal   Collection Time: 10/30/23  3:31 AM  Result Value Ref Range   Sodium 134 (L) 135 - 145 mmol/L   Potassium 3.8 3.5 - 5.1 mmol/L   Chloride 106 98 - 111 mmol/L   CO2 22 22 - 32 mmol/L   Glucose, Bld 92 70 - 99 mg/dL   BUN 32 (H) 8 - 23 mg/dL   Creatinine, Ser 6.96 0.61 - 1.24 mg/dL   Calcium  8.1 (L) 8.9 - 10.3 mg/dL   GFR, Estimated >29 >52 mL/min   Anion gap 6 5 - 15    CUP PACEART REMOTE DEVICE CHECK Result Date: 10/29/2023 ILR summary report received. Battery status OK. Normal device function. No new symptom, tachy, brady, or pause episodes. No new AF episodes.  Monthly summary reports and ROV/PRN  CS, CVRS  DG HIP UNILAT W OR W/O PELVIS 2-3 VIEWS LEFT Result Date: 10/28/2023 CLINICAL DATA:  Postop. EXAM: DG HIP (WITH OR WITHOUT PELVIS) 2-3V LEFT COMPARISON:  Preoperative imaging FINDINGS: Left hip arthroplasty in expected alignment. No periprosthetic lucency or fracture. Recent postsurgical change includes air and edema in the soft tissues. IMPRESSION: Left hip arthroplasty without immediate postoperative complication. Electronically Signed   By: Chadwick Colonel M.D.   On: 10/28/2023 17:44    Assessment/Plan: Principal Problem:   Femur fracture, left (HCC) Active Problems:   CAD (coronary artery disease)   NICM (nonischemic cardiomyopathy) (HCC)   Paroxysmal ventricular tachycardia (HCC)   PVC's (premature ventricular contractions)   Osteoporosis   Seizure (HCC)   Chronic combined systolic and diastolic heart failure (HCC)   Nonobstructive atherosclerosis of coronary artery   Atrial fibrillation (HCC)   2 Days Post-Op s/p Procedure(s): HEMIARTHROPLASTY (BIPOLAR) HIP, POSTERIOR APPROACH FOR FRACTURE  Weightbearing: WBAT LLE Insicional and dressing care: Reinforce dressings as needed VTE prophylaxis: Hbg dropped  to 9.7 this morning, home Eliquis  restarted yesterday, will watch while inpatient. No outward signs of bleeding Pain control: continue current regimen Follow - up plan: 2 weeks with Agatha Horsfall Dispo: pending SNF placement   Contact information:   Hurshel Maidens, PA-C Weekdays 8-5  After hours and holidays please check [email or web address] for group call information for [facility]  Abraham Hoffmann 10/30/2023, 10:20 AM

## 2023-11-01 DIAGNOSIS — I2510 Atherosclerotic heart disease of native coronary artery without angina pectoris: Secondary | ICD-10-CM | POA: Diagnosis not present

## 2023-11-01 DIAGNOSIS — L89156 Pressure-induced deep tissue damage of sacral region: Secondary | ICD-10-CM | POA: Diagnosis not present

## 2023-11-01 DIAGNOSIS — H8111 Benign paroxysmal vertigo, right ear: Secondary | ICD-10-CM | POA: Diagnosis not present

## 2023-11-02 ENCOUNTER — Telehealth: Payer: Self-pay | Admitting: Internal Medicine

## 2023-11-02 NOTE — Telephone Encounter (Signed)
 Pt had a appointment for an HFU on Dr. Donnette Gal schedule instead of Dr Autry Legions schedule. Called pt back to fix error and he mentioned he will like to cancel the whole appt due to hip pain from falling not to long ago so he don't know when he want to come back in he said he will give us  a call when he feel like time is right for him.  Thanks

## 2023-11-02 NOTE — Telephone Encounter (Signed)
This is noted.

## 2023-11-04 ENCOUNTER — Ambulatory Visit: Payer: Self-pay | Admitting: Cardiology

## 2023-11-05 NOTE — Progress Notes (Signed)
 [facility] Summary Report / Loop Recorder

## 2023-11-05 NOTE — Addendum Note (Signed)
 Addended by: Edra Govern D on: 11/05/2023 05:04 PM   Modules accepted: Orders

## 2023-11-08 ENCOUNTER — Inpatient Hospital Stay: Admitting: Internal Medicine

## 2023-11-13 DIAGNOSIS — I472 Ventricular tachycardia, unspecified: Secondary | ICD-10-CM | POA: Diagnosis not present

## 2023-11-13 DIAGNOSIS — I2510 Atherosclerotic heart disease of native coronary artery without angina pectoris: Secondary | ICD-10-CM | POA: Diagnosis not present

## 2023-11-13 DIAGNOSIS — G4089 Other seizures: Secondary | ICD-10-CM | POA: Diagnosis not present

## 2023-11-13 DIAGNOSIS — I5042 Chronic combined systolic (congestive) and diastolic (congestive) heart failure: Secondary | ICD-10-CM | POA: Diagnosis not present

## 2023-11-13 DIAGNOSIS — I4891 Unspecified atrial fibrillation: Secondary | ICD-10-CM | POA: Diagnosis not present

## 2023-11-14 ENCOUNTER — Inpatient Hospital Stay (HOSPITAL_COMMUNITY)
Admission: EM | Admit: 2023-11-14 | Discharge: 2023-11-17 | DRG: 641 | Disposition: A | Discharge status: Home Health | Attending: Internal Medicine | Admitting: Internal Medicine

## 2023-11-14 ENCOUNTER — Emergency Department (HOSPITAL_COMMUNITY)

## 2023-11-14 ENCOUNTER — Encounter (HOSPITAL_COMMUNITY): Payer: Self-pay

## 2023-11-14 ENCOUNTER — Other Ambulatory Visit: Payer: Self-pay

## 2023-11-14 DIAGNOSIS — E440 Moderate protein-calorie malnutrition: Secondary | ICD-10-CM | POA: Insufficient documentation

## 2023-11-14 DIAGNOSIS — R9431 Abnormal electrocardiogram [ECG] [EKG]: Secondary | ICD-10-CM | POA: Diagnosis not present

## 2023-11-14 DIAGNOSIS — I480 Paroxysmal atrial fibrillation: Secondary | ICD-10-CM | POA: Diagnosis not present

## 2023-11-14 DIAGNOSIS — I771 Stricture of artery: Secondary | ICD-10-CM | POA: Diagnosis not present

## 2023-11-14 DIAGNOSIS — Z860100 Personal history of colon polyps, unspecified: Secondary | ICD-10-CM | POA: Diagnosis not present

## 2023-11-14 DIAGNOSIS — E785 Hyperlipidemia, unspecified: Secondary | ICD-10-CM | POA: Diagnosis present

## 2023-11-14 DIAGNOSIS — G40909 Epilepsy, unspecified, not intractable, without status epilepticus: Secondary | ICD-10-CM

## 2023-11-14 DIAGNOSIS — I2510 Atherosclerotic heart disease of native coronary artery without angina pectoris: Secondary | ICD-10-CM | POA: Diagnosis present

## 2023-11-14 DIAGNOSIS — I700 Atherosclerosis of aorta: Secondary | ICD-10-CM | POA: Diagnosis not present

## 2023-11-14 DIAGNOSIS — I110 Hypertensive heart disease with heart failure: Secondary | ICD-10-CM | POA: Diagnosis present

## 2023-11-14 DIAGNOSIS — E872 Acidosis, unspecified: Secondary | ICD-10-CM | POA: Diagnosis present

## 2023-11-14 DIAGNOSIS — R0602 Shortness of breath: Secondary | ICD-10-CM

## 2023-11-14 DIAGNOSIS — I428 Other cardiomyopathies: Secondary | ICD-10-CM | POA: Diagnosis not present

## 2023-11-14 DIAGNOSIS — R0902 Hypoxemia: Secondary | ICD-10-CM | POA: Diagnosis not present

## 2023-11-14 DIAGNOSIS — Z79899 Other long term (current) drug therapy: Secondary | ICD-10-CM

## 2023-11-14 DIAGNOSIS — L8990 Pressure ulcer of unspecified site, unspecified stage: Secondary | ICD-10-CM | POA: Insufficient documentation

## 2023-11-14 DIAGNOSIS — Z825 Family history of asthma and other chronic lower respiratory diseases: Secondary | ICD-10-CM | POA: Diagnosis not present

## 2023-11-14 DIAGNOSIS — R600 Localized edema: Secondary | ICD-10-CM

## 2023-11-14 DIAGNOSIS — K219 Gastro-esophageal reflux disease without esophagitis: Secondary | ICD-10-CM | POA: Diagnosis not present

## 2023-11-14 DIAGNOSIS — M810 Age-related osteoporosis without current pathological fracture: Secondary | ICD-10-CM | POA: Diagnosis not present

## 2023-11-14 DIAGNOSIS — I719 Aortic aneurysm of unspecified site, without rupture: Secondary | ICD-10-CM | POA: Diagnosis not present

## 2023-11-14 DIAGNOSIS — E861 Hypovolemia: Secondary | ICD-10-CM

## 2023-11-14 DIAGNOSIS — S72042D Displaced fracture of base of neck of left femur, subsequent encounter for closed fracture with routine healing: Secondary | ICD-10-CM | POA: Diagnosis not present

## 2023-11-14 DIAGNOSIS — Z950 Presence of cardiac pacemaker: Secondary | ICD-10-CM | POA: Diagnosis not present

## 2023-11-14 DIAGNOSIS — Z7901 Long term (current) use of anticoagulants: Secondary | ICD-10-CM | POA: Diagnosis not present

## 2023-11-14 DIAGNOSIS — R231 Pallor: Secondary | ICD-10-CM | POA: Diagnosis not present

## 2023-11-14 DIAGNOSIS — L89152 Pressure ulcer of sacral region, stage 2: Secondary | ICD-10-CM | POA: Diagnosis present

## 2023-11-14 DIAGNOSIS — I5042 Chronic combined systolic (congestive) and diastolic (congestive) heart failure: Secondary | ICD-10-CM | POA: Diagnosis not present

## 2023-11-14 DIAGNOSIS — Z8249 Family history of ischemic heart disease and other diseases of the circulatory system: Secondary | ICD-10-CM

## 2023-11-14 DIAGNOSIS — W19XXXD Unspecified fall, subsequent encounter: Secondary | ICD-10-CM | POA: Diagnosis present

## 2023-11-14 DIAGNOSIS — I959 Hypotension, unspecified: Secondary | ICD-10-CM | POA: Diagnosis present

## 2023-11-14 DIAGNOSIS — I714 Abdominal aortic aneurysm, without rupture, unspecified: Secondary | ICD-10-CM | POA: Diagnosis present

## 2023-11-14 DIAGNOSIS — E869 Volume depletion, unspecified: Secondary | ICD-10-CM | POA: Diagnosis not present

## 2023-11-14 DIAGNOSIS — Z836 Family history of other diseases of the respiratory system: Secondary | ICD-10-CM

## 2023-11-14 DIAGNOSIS — Z87442 Personal history of urinary calculi: Secondary | ICD-10-CM

## 2023-11-14 DIAGNOSIS — Z6824 Body mass index (BMI) 24.0-24.9, adult: Secondary | ICD-10-CM | POA: Diagnosis not present

## 2023-11-14 DIAGNOSIS — S72012D Unspecified intracapsular fracture of left femur, subsequent encounter for closed fracture with routine healing: Secondary | ICD-10-CM

## 2023-11-14 DIAGNOSIS — Z8041 Family history of malignant neoplasm of ovary: Secondary | ICD-10-CM

## 2023-11-14 DIAGNOSIS — R197 Diarrhea, unspecified: Secondary | ICD-10-CM | POA: Diagnosis not present

## 2023-11-14 DIAGNOSIS — Z885 Allergy status to narcotic agent status: Secondary | ICD-10-CM

## 2023-11-14 DIAGNOSIS — Z96642 Presence of left artificial hip joint: Secondary | ICD-10-CM | POA: Diagnosis present

## 2023-11-14 DIAGNOSIS — L89002 Pressure ulcer of unspecified elbow, stage 2: Secondary | ICD-10-CM | POA: Diagnosis not present

## 2023-11-14 DIAGNOSIS — I499 Cardiac arrhythmia, unspecified: Secondary | ICD-10-CM | POA: Diagnosis not present

## 2023-11-14 DIAGNOSIS — J9811 Atelectasis: Secondary | ICD-10-CM | POA: Diagnosis not present

## 2023-11-14 DIAGNOSIS — Z9049 Acquired absence of other specified parts of digestive tract: Secondary | ICD-10-CM

## 2023-11-14 LAB — URINALYSIS, ROUTINE W REFLEX MICROSCOPIC
Bilirubin Urine: NEGATIVE
Glucose, UA: NEGATIVE mg/dL
Hgb urine dipstick: NEGATIVE
Ketones, ur: 5 mg/dL — AB
Leukocytes,Ua: NEGATIVE
Nitrite: NEGATIVE
Protein, ur: NEGATIVE mg/dL
Specific Gravity, Urine: 1.016 (ref 1.005–1.030)
pH: 6 (ref 5.0–8.0)

## 2023-11-14 LAB — COMPREHENSIVE METABOLIC PANEL WITH GFR
ALT: 25 U/L (ref 0–44)
AST: 28 U/L (ref 15–41)
Albumin: 3.1 g/dL — ABNORMAL LOW (ref 3.5–5.0)
Alkaline Phosphatase: 76 U/L (ref 38–126)
Anion gap: 16 — ABNORMAL HIGH (ref 5–15)
BUN: 31 mg/dL — ABNORMAL HIGH (ref 8–23)
CO2: 19 mmol/L — ABNORMAL LOW (ref 22–32)
Calcium: 9.3 mg/dL (ref 8.9–10.3)
Chloride: 103 mmol/L (ref 98–111)
Creatinine, Ser: 1.49 mg/dL — ABNORMAL HIGH (ref 0.61–1.24)
GFR, Estimated: 46 mL/min — ABNORMAL LOW (ref >60)
Glucose, Bld: 135 mg/dL — ABNORMAL HIGH (ref 70–99)
Potassium: 4.4 mmol/L (ref 3.5–5.1)
Sodium: 138 mmol/L (ref 135–145)
Total Bilirubin: 1.3 mg/dL — ABNORMAL HIGH (ref 0.0–1.2)
Total Protein: 6.6 g/dL (ref 6.5–8.1)

## 2023-11-14 LAB — VALPROIC ACID LEVEL: Valproic Acid Lvl: 43 ug/mL — ABNORMAL LOW (ref 50–100)

## 2023-11-14 LAB — CBC WITH DIFFERENTIAL/PLATELET
Abs Immature Granulocytes: 0.2 10*3/uL — ABNORMAL HIGH (ref 0.00–0.07)
Basophils Absolute: 0.1 10*3/uL (ref 0.0–0.1)
Basophils Relative: 1 %
Eosinophils Absolute: 0.1 10*3/uL (ref 0.0–0.5)
Eosinophils Relative: 1 %
HCT: 44 % (ref 39.0–52.0)
Hemoglobin: 14.1 g/dL (ref 13.0–17.0)
Immature Granulocytes: 2 %
Lymphocytes Relative: 18 %
Lymphs Abs: 1.7 10*3/uL (ref 0.7–4.0)
MCH: 31.6 pg (ref 26.0–34.0)
MCHC: 32 g/dL (ref 30.0–36.0)
MCV: 98.7 fL (ref 80.0–100.0)
Monocytes Absolute: 0.9 10*3/uL (ref 0.1–1.0)
Monocytes Relative: 9 %
Neutro Abs: 6.6 10*3/uL (ref 1.7–7.7)
Neutrophils Relative %: 69 %
Platelets: 346 10*3/uL (ref 150–400)
RBC: 4.46 MIL/uL (ref 4.22–5.81)
RDW: 14 % (ref 11.5–15.5)
WBC: 9.5 10*3/uL (ref 4.0–10.5)
nRBC: 0 % (ref 0.0–0.2)

## 2023-11-14 LAB — I-STAT CG4 LACTIC ACID, ED
Lactic Acid, Venous: 8.1 mmol/L (ref 0.5–1.9)
Lactic Acid, Venous: 5.5 mmol/L (ref 0.5–1.9)

## 2023-11-14 LAB — PHOSPHORUS: Phosphorus: 3.6 mg/dL (ref 2.5–4.6)

## 2023-11-14 LAB — MAGNESIUM: Magnesium: 2.1 mg/dL (ref 1.7–2.4)

## 2023-11-14 LAB — CK: Total CK: 89 U/L (ref 49–397)

## 2023-11-14 MED ORDER — ACETAMINOPHEN 650 MG RE SUPP: 650.0000 mg | Freq: Four times a day (QID) | RECTAL | Status: DC | PRN

## 2023-11-14 MED ORDER — DIVALPROEX SODIUM ER 500 MG PO TB24
500.0000 mg | ORAL_TABLET | Freq: Two times a day (BID) | ORAL | Status: DC
  Administered 2023-11-14 – 2023-11-17 (×6): 500 mg via ORAL
  Filled 2023-11-14 (×6): qty 1

## 2023-11-14 MED ORDER — ACETAMINOPHEN 325 MG PO TABS: 650.0000 mg | ORAL_TABLET | Freq: Four times a day (QID) | ORAL | Status: DC | PRN

## 2023-11-14 MED ORDER — ONDANSETRON HCL 4 MG PO TABS: 4.0000 mg | ORAL_TABLET | Freq: Four times a day (QID) | ORAL | Status: DC | PRN

## 2023-11-14 MED ORDER — IOHEXOL 350 MG/ML SOLN
75.0000 mL | Freq: Once | INTRAVENOUS | Status: AC | PRN
  Administered 2023-11-14: 75 mL via INTRAVENOUS

## 2023-11-14 MED ORDER — LACTATED RINGERS IV BOLUS
500.0000 mL | Freq: Once | INTRAVENOUS | Status: AC
  Administered 2023-11-14: 500 mL via INTRAVENOUS

## 2023-11-14 MED ORDER — LACTATED RINGERS IV BOLUS
1000.0000 mL | Freq: Once | INTRAVENOUS | Status: AC
  Administered 2023-11-14: 1000 mL via INTRAVENOUS

## 2023-11-14 MED ORDER — LATANOPROST 0.005 % OP SOLN
1.0000 [drp] | Freq: Every day | OPHTHALMIC | Status: DC
  Administered 2023-11-14 – 2023-11-16 (×3): 1 [drp] via OPHTHALMIC
  Filled 2023-11-14: qty 2.5

## 2023-11-14 MED ORDER — LISINOPRIL 2.5 MG PO TABS
2.5000 mg | ORAL_TABLET | Freq: Every day | ORAL | Status: DC
  Filled 2023-11-14: qty 1

## 2023-11-14 MED ORDER — LACTATED RINGERS IV SOLN: INTRAVENOUS | Status: DC

## 2023-11-14 MED ORDER — OXYCODONE HCL 5 MG PO TABS: 5.0000 mg | ORAL_TABLET | ORAL | Status: DC | PRN

## 2023-11-14 MED ORDER — ONDANSETRON HCL 4 MG/2ML IJ SOLN: 4.0000 mg | Freq: Four times a day (QID) | INTRAMUSCULAR | Status: DC | PRN

## 2023-11-14 MED ORDER — APIXABAN 5 MG PO TABS
5.0000 mg | ORAL_TABLET | Freq: Two times a day (BID) | ORAL | Status: DC
  Administered 2023-11-14 – 2023-11-17 (×6): 5 mg via ORAL
  Filled 2023-11-14 (×6): qty 1

## 2023-11-14 MED ORDER — SODIUM CHLORIDE (PF) 0.9 % IJ SOLN
INTRAMUSCULAR | Status: AC
  Filled 2023-11-14: qty 50

## 2023-11-14 NOTE — ED Notes (Signed)
 I stat Lactic Acid 5.52 given to MD Tegeler and RN

## 2023-11-14 NOTE — ED Triage Notes (Incomplete)
 Hip 2 weeks ago. Post op appointment today and they were not able to get a BP on him because he was hypotensive. Pale, clammy, increased RR, very loose stools, Afib been off Eliquis  since surgery 2 weeks ago. Possible PE or fluid loss? 20 LAC. BP 88/??.

## 2023-11-14 NOTE — ED Notes (Signed)
 Gave I stat Lactic 8.07 to MD and RN
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
## 2023-11-14 NOTE — ED Provider Notes (Signed)
 Care assumed from Dr. Val Garin.  At time of transfer of care, patient awaiting workup to be completed and plan will be to admit for hypotension and elevated lactic acid in the setting of fatigue.  Patient reportedly was not having any hip pain after recent surgery and is on Eliquis  for A-fib.  He has just felt more fatigued and tired and decreased oral intake.  No reported fevers and has not had other symptoms such as cough or congestion.  He did have soft oxygen saturations and is now on 2 L.  Given the recent surgery hypotension shortness of breath and hypoxia, patient will get CT PE study that we are waiting on and other workup.  Anticipate reassessment.  Previous team decided to hold on code sepsis given the lack of fever and less concern for infection initially and given his history of fluid overload heart failure was judicious with fluid administration as they do not suspect sepsis initially.  Anticipate reassessment after workup completion.  6:38 PM CT scan returned without pulmonary embolism, pneumonia or pneumothorax.  No concerning findings seen on CT aside from a AAA that he will  need annual imaging surveillance.  Otherwise he was near febrile but not actually febrile.  Oxygen saturations were decreased and he reports his blood pressure was down to the 60s at the orthopedic office.  Due to this hypotension, near syncope, shortness of breath with new oxygen requirement, do not feel he is safe for discharge home.  Lactic acid is downtrending.  Given his reassuring CT scan, will hold on antibiotics initially and discussed with medicine if they want to give antibiotics or not.  He does report some mild cough but the CT did not show anything.  Will call for admission now.  Clinical Impression: 1. Hypoxia   2. SOB (shortness of breath)   3. Hypotension, unspecified hypotension type   4. Lactic acidosis     Disposition: Admit  This note was prepared with assistance of Dragon voice  recognition software. Occasional wrong-word or sound-a-like substitutions may have occurred due to the inherent limitations of voice recognition software.     Gary Atkins, Marine Sia, MD 11/14/23 2110
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
## 2023-11-15 ENCOUNTER — Telehealth: Payer: Self-pay | Admitting: Cardiology

## 2023-11-15 DIAGNOSIS — K219 Gastro-esophageal reflux disease without esophagitis: Secondary | ICD-10-CM | POA: Diagnosis not present

## 2023-11-15 DIAGNOSIS — L89002 Pressure ulcer of unspecified elbow, stage 2: Secondary | ICD-10-CM

## 2023-11-15 DIAGNOSIS — I480 Paroxysmal atrial fibrillation: Secondary | ICD-10-CM

## 2023-11-15 DIAGNOSIS — L8990 Pressure ulcer of unspecified site, unspecified stage: Secondary | ICD-10-CM | POA: Insufficient documentation

## 2023-11-15 DIAGNOSIS — I2510 Atherosclerotic heart disease of native coronary artery without angina pectoris: Secondary | ICD-10-CM

## 2023-11-15 DIAGNOSIS — E872 Acidosis, unspecified: Secondary | ICD-10-CM | POA: Diagnosis not present

## 2023-11-15 DIAGNOSIS — I5042 Chronic combined systolic (congestive) and diastolic (congestive) heart failure: Secondary | ICD-10-CM | POA: Diagnosis not present

## 2023-11-15 DIAGNOSIS — E861 Hypovolemia: Secondary | ICD-10-CM

## 2023-11-15 DIAGNOSIS — G40909 Epilepsy, unspecified, not intractable, without status epilepticus: Secondary | ICD-10-CM

## 2023-11-15 LAB — CBC
HCT: 37.3 % — ABNORMAL LOW (ref 39.0–52.0)
Hemoglobin: 11.6 g/dL — ABNORMAL LOW (ref 13.0–17.0)
MCH: 31.5 pg (ref 26.0–34.0)
MCHC: 31.1 g/dL (ref 30.0–36.0)
MCV: 101.4 fL — ABNORMAL HIGH (ref 80.0–100.0)
Platelets: 244 10*3/uL (ref 150–400)
RBC: 3.68 MIL/uL — ABNORMAL LOW (ref 4.22–5.81)
RDW: 13.9 % (ref 11.5–15.5)
WBC: 7.2 10*3/uL (ref 4.0–10.5)
nRBC: 0 % (ref 0.0–0.2)

## 2023-11-15 LAB — COMPREHENSIVE METABOLIC PANEL WITH GFR
ALT: 19 U/L (ref 0–44)
AST: 18 U/L (ref 15–41)
Albumin: 2.4 g/dL — ABNORMAL LOW (ref 3.5–5.0)
Alkaline Phosphatase: 63 U/L (ref 38–126)
Anion gap: 10 (ref 5–15)
BUN: 28 mg/dL — ABNORMAL HIGH (ref 8–23)
CO2: 22 mmol/L (ref 22–32)
Calcium: 8.8 mg/dL — ABNORMAL LOW (ref 8.9–10.3)
Chloride: 105 mmol/L (ref 98–111)
Creatinine, Ser: 1.01 mg/dL (ref 0.61–1.24)
GFR, Estimated: >60 mL/min (ref >60)
Glucose, Bld: 99 mg/dL (ref 70–99)
Potassium: 4 mmol/L (ref 3.5–5.1)
Sodium: 137 mmol/L (ref 135–145)
Total Bilirubin: 1.3 mg/dL — ABNORMAL HIGH (ref 0.0–1.2)
Total Protein: 5.2 g/dL — ABNORMAL LOW (ref 6.5–8.1)

## 2023-11-15 LAB — PROCALCITONIN: Procalcitonin: <0.1 ng/mL

## 2023-11-15 LAB — C-REACTIVE PROTEIN: CRP: <0.5 mg/dL (ref <1.0)

## 2023-11-15 LAB — LACTIC ACID, PLASMA: Lactic Acid, Venous: 1.5 mmol/L (ref 0.5–1.9)

## 2023-11-15 MED ORDER — ALBUMIN HUMAN 25 % IV SOLN
25.0000 g | Freq: Once | INTRAVENOUS | Status: AC
  Administered 2023-11-15: 12.5 g via INTRAVENOUS
  Filled 2023-11-15: qty 100

## 2023-11-15 MED ORDER — ADULT MULTIVITAMIN W/MINERALS CH
1.0000 | ORAL_TABLET | Freq: Every day | ORAL | Status: DC
  Administered 2023-11-15 – 2023-11-17 (×3): 1 via ORAL
  Filled 2023-11-15 (×3): qty 1

## 2023-11-15 MED ORDER — LACTATED RINGERS IV SOLN: INTRAVENOUS | Status: DC

## 2023-11-15 MED ORDER — PANTOPRAZOLE SODIUM 40 MG PO TBEC
40.0000 mg | DELAYED_RELEASE_TABLET | Freq: Every day | ORAL | Status: DC
  Administered 2023-11-16 – 2023-11-17 (×2): 40 mg via ORAL
  Filled 2023-11-15 (×2): qty 1

## 2023-11-15 MED ORDER — LACTATED RINGERS IV BOLUS
500.0000 mL | Freq: Once | INTRAVENOUS | Status: AC
  Administered 2023-11-15: 500 mL via INTRAVENOUS

## 2023-11-15 MED ORDER — ENSURE PLUS HIGH PROTEIN PO LIQD
237.0000 mL | Freq: Two times a day (BID) | ORAL | Status: DC
  Administered 2023-11-16 – 2023-11-17 (×4): 237 mL via ORAL

## 2023-11-15 NOTE — Progress Notes (Signed)
   11/15/23 1556  TOC Brief Assessment  Insurance and Status Reviewed  Patient has primary care physician Yes  Home environment has been reviewed home w/ spouse  Prior level of function: independent  Prior/Current Home Services No current home services  Social Drivers of Health Review SDOH reviewed no interventions necessary  Readmission risk has been reviewed Yes  Transition of care needs no transition of care needs at this time

## 2023-11-15 NOTE — Assessment & Plan Note (Signed)
 Continue home regimen of apixaban  Rate controlled

## 2023-11-15 NOTE — Assessment & Plan Note (Signed)
 Patient presenting with profound lactic acidosis and no evidence of substantial hypotension and hypovolemia Family reports several week history of extremely poor oral intake while patient has been taking Lasix  daily at his skilled nursing facility Lasix  has been held Hydrating patient with intravenous isotonic fluids Serial lactic acid levels to ensure downtrending and resolution Procalcitonin, CRP, urinalysis and chest x-ray all revealed no evidence of infection

## 2023-11-15 NOTE — Plan of Care (Signed)

## 2023-11-15 NOTE — Progress Notes (Signed)
 PROGRESS NOTE   Gary Atkins  UEA:[id number] DOB: 1941/03/06 DOA: 11/14/2023 PCP: Roslyn Coombe, MD   Date of Service: the patient was seen and examined on 11/15/2023  Brief Narrative:   83 y.o. 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   Of note, patient was recently hospitalized from 5/10 until 5/13 due to fall and resultant left hip fracture status post ORIF.  Upon evaluation in the emergency room patient was found to be exhibiting a lactic acidosis of 8.1.  Clinically the patient was felt to be volume depleted.  Hospitalist group was called to assess the patient for admission in the hospital.   Assessment & Plan Lactic acidosis Patient presenting with profound lactic acidosis and no evidence of substantial hypotension and hypovolemia Family reports several week history of extremely poor oral intake while patient has been taking Lasix  daily at his skilled nursing facility Lasix  has been held Hydrating patient with intravenous isotonic fluids Serial lactic acid levels to ensure downtrending and resolution Procalcitonin, CRP, urinalysis and chest x-ray all revealed no evidence of infection Hypovolemia See notations above Pressure injury of skin Present on admission Daily dressing changes Chronic combined systolic and diastolic congestive heart failure (HCC) Holding outpatient regimen of Lasix  Monitoring closely for any evidence of volume overload as we perform intravenous volume resuscitation Paroxysmal atrial fibrillation (HCC) Continue home regimen of apixaban  Rate controlled Coronary artery disease involving native coronary artery of native heart without angina pectoris Chest  pain-free Continue apixaban  GERD without esophagitis Protonix  daily Seizure disorder (HCC) Continue Depakote      Subjective:  Family reports the patient has been experiencing several week history of extremely poor oral intake and generalized weakness.  Patient reports poor appetite.  Patient denies abdominal pain.  Physical Exam:  Vitals:   11/15/23 0405 11/15/23 1042 11/15/23 2011 11/15/23 2015  BP: (!) 100/52 (!) 93/59 (!) 89/57 (!) 90/50  Pulse: 75  71   Resp: 20     Temp: 97.9 F (36.6 C)  98.3 F (36.8 C)   TempSrc:   Oral   SpO2: 100%  100%   Weight:      Height:         Constitutional: Awake alert and oriented x3, no associated distress.   Skin: no rashes, no lesions, extremely poor skin turgor noted. Eyes: Pupils are equally reactive to light.  No evidence of scleral icterus or conjunctival pallor.  ENMT: Moist mucous membranes noted.  Posterior pharynx clear of any exudate or lesions.   Respiratory: clear to auscultation bilaterally, no wheezing, no crackles. Normal respiratory effort. No accessory muscle use.  Cardiovascular: Regular rate and rhythm, no murmurs / rubs / gallops. No extremity edema. 2+ pedal pulses. No carotid bruits.  Abdomen: Abdomen is soft and nontender.  No evidence of intra-abdominal masses.  Positive bowel sounds noted in all quadrants.   Musculoskeletal: No joint deformity upper and lower extremities. Good ROM, no contractures.  Poor muscle tone   Data Reviewed:  I have personally reviewed and interpreted labs, imaging.  Significant findings are   CBC: Recent Labs  Lab 11/14/23 1353 11/15/23 0532  WBC 9.5 7.2  NEUTROABS 6.6  --   HGB 14.1 11.6*  HCT 44.0 37.3*  MCV 98.7 101.4*  PLT 346 244  Basic Metabolic Panel: Recent Labs  Lab 11/14/23 1353 11/14/23 2112 11/15/23 0532  NA 138  --  137  K 4.4  --  4.0  CL 103  --  105  CO2 19*  --  22  GLUCOSE 135*  --  99  BUN 31*  --  28*  CREATININE 1.49*  --  1.01  CALCIUM   9.3  --  8.8*  MG  --  2.1  --   PHOS  --  3.6  --    GFR: Estimated Creatinine Clearance: 54.4 mL/min (by C-G formula based on SCr of 1.01 mg/dL). Liver Function Tests: Recent Labs  Lab 11/14/23 1353 11/15/23 0532  AST 28 18  ALT 25 19  ALKPHOS 76 63  BILITOT 1.3* 1.3*  PROT 6.6 5.2*  ALBUMIN  3.1* 2.4*    Code Status:  Full code.  Code status decision has been confirmed with: patient Family Communication: Wife is at bedside and has been updated on plan of care   Severity of Illness:  The appropriate patient status for this patient is OBSERVATION. Observation status is judged to be reasonable and necessary in order to provide the required intensity of service to ensure the patient's safety. The patient's presenting symptoms, physical exam findings, and initial radiographic and laboratory data in the context of their medical condition is felt to place them at decreased risk for further clinical deterioration. Furthermore, it is anticipated that the patient will be medically stable for discharge from the hospital within 2 midnights of admission.   Time spent:  55 minutes  Author:  True Fuss MD  11/15/2023 10:38 PM

## 2023-11-15 NOTE — Assessment & Plan Note (Signed)
 Holding outpatient regimen of Lasix  Monitoring closely for any evidence of volume overload as we perform intravenous volume resuscitation

## 2023-11-15 NOTE — Progress Notes (Signed)
   11/15/23 1556  TOC Brief Assessment  Insurance and Status Reviewed  Patient has primary care physician Yes  Home environment has been reviewed home w/ spouse  Prior level of function: independent  Prior/Current Home Services No current home services  Social Drivers of Health Review SDOH reviewed no interventions necessary  Readmission risk has been reviewed Yes  Transition of care needs transition of care needs identified, TOC will continue to follow

## 2023-11-15 NOTE — Hospital Course (Addendum)
 83 y.o. 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   Of note, patient was recently hospitalized from 5/10 until 5/13 due to fall and resultant left hip fracture status post ORIF.  Upon evaluation in the emergency room patient was found to be exhibiting a lactic acidosis of 8.1.  Clinically the patient was felt to be volume depleted.  The hospitalist group was then called to assess the patient for admission in the hospital.  The patient was evaluated for possible causes of infection and workup was found to be negative.  The etiology of the lactic acidosis was felt to be profound volume depletion in the setting of outpatient diuretic use.  After discontinuation of the patient's diuretic and intravenous hydration the patient's lactic acidosis resolved.    During this hospitalization the patient was also observed to exhibit significant hypotension despite adequate intravenous volume resuscitation.  Home antihypertensive therapy was discontinued.  Due to patient's recent weight loss, patient was felt to no longer need his previous blood pressure regimen.  Furthermore upon review of prior hospitalizations, blood pressures were thought to be similar.  Patient was additionally provided with compression stockings to be used when standing and ambulating.   Patient was also noted to have a sacral wound during this hospitalization identified to be present on admission.  Local wound care was performed daily and patient was provided with supplies for continued care at time of discharge in addition to follow up being arranged.   Patient was discharged home in improved and stable condition on 11/17/2023.

## 2023-11-15 NOTE — Assessment & Plan Note (Signed)
 See notations above.

## 2023-11-15 NOTE — Assessment & Plan Note (Signed)
Continue Depakote. 

## 2023-11-15 NOTE — Assessment & Plan Note (Signed)
 Protonix daily

## 2023-11-15 NOTE — Care Management Obs Status (Signed)
 MEDICARE OBSERVATION STATUS NOTIFICATION   Patient Details  Name: Gary Atkins MRN: [id number] Date of Birth: [birth year]   Medicare Observation Status Notification Given:  Yes    Amaryllis Junior, LCSW 11/15/2023, 3:53 PM

## 2023-11-15 NOTE — Telephone Encounter (Signed)
 Pt c/o BP issue: STAT if pt c/o blurred vision, one-sided weakness or slurred speech.   1. What is your BP concern?  BP is extremely low.  2. Have you taken any BP medication today? Wife says patient doesn't take any BP medications.  3. What are your last 5 BP readings? 88/59 about 1 hour ago, but patient is currently admitted at [facility]. Wife just wanted to inform Dr. Veryl Gottron  4. Are you having any other symptoms (ex. Dizziness, headache, blurred vision, passed out)?  weakness

## 2023-11-15 NOTE — Assessment & Plan Note (Signed)
 Chest pain-free Continue apixaban 

## 2023-11-15 NOTE — Plan of Care (Signed)

## 2023-11-15 NOTE — Assessment & Plan Note (Signed)
 Present on admission  Daily dressing changes

## 2023-11-16 ENCOUNTER — Observation Stay (HOSPITAL_COMMUNITY)

## 2023-11-16 DIAGNOSIS — R0902 Hypoxemia: Secondary | ICD-10-CM | POA: Diagnosis present

## 2023-11-16 DIAGNOSIS — I428 Other cardiomyopathies: Secondary | ICD-10-CM | POA: Diagnosis present

## 2023-11-16 DIAGNOSIS — Z743 Need for continuous supervision: Secondary | ICD-10-CM | POA: Diagnosis not present

## 2023-11-16 DIAGNOSIS — R531 Weakness: Secondary | ICD-10-CM | POA: Diagnosis not present

## 2023-11-16 DIAGNOSIS — I480 Paroxysmal atrial fibrillation: Secondary | ICD-10-CM | POA: Diagnosis not present

## 2023-11-16 DIAGNOSIS — E440 Moderate protein-calorie malnutrition: Secondary | ICD-10-CM | POA: Diagnosis present

## 2023-11-16 DIAGNOSIS — W19XXXD Unspecified fall, subsequent encounter: Secondary | ICD-10-CM | POA: Diagnosis present

## 2023-11-16 DIAGNOSIS — E872 Acidosis, unspecified: Secondary | ICD-10-CM | POA: Diagnosis not present

## 2023-11-16 DIAGNOSIS — E785 Hyperlipidemia, unspecified: Secondary | ICD-10-CM | POA: Diagnosis present

## 2023-11-16 DIAGNOSIS — Z96642 Presence of left artificial hip joint: Secondary | ICD-10-CM | POA: Diagnosis present

## 2023-11-16 DIAGNOSIS — K219 Gastro-esophageal reflux disease without esophagitis: Secondary | ICD-10-CM | POA: Diagnosis not present

## 2023-11-16 DIAGNOSIS — Z7901 Long term (current) use of anticoagulants: Secondary | ICD-10-CM | POA: Diagnosis not present

## 2023-11-16 DIAGNOSIS — Z6824 Body mass index (BMI) 24.0-24.9, adult: Secondary | ICD-10-CM | POA: Diagnosis not present

## 2023-11-16 DIAGNOSIS — J9811 Atelectasis: Secondary | ICD-10-CM | POA: Diagnosis not present

## 2023-11-16 DIAGNOSIS — L89002 Pressure ulcer of unspecified elbow, stage 2: Secondary | ICD-10-CM | POA: Diagnosis not present

## 2023-11-16 DIAGNOSIS — J90 Pleural effusion, not elsewhere classified: Secondary | ICD-10-CM | POA: Diagnosis not present

## 2023-11-16 DIAGNOSIS — G40909 Epilepsy, unspecified, not intractable, without status epilepticus: Secondary | ICD-10-CM | POA: Diagnosis present

## 2023-11-16 DIAGNOSIS — Z8041 Family history of malignant neoplasm of ovary: Secondary | ICD-10-CM | POA: Diagnosis not present

## 2023-11-16 DIAGNOSIS — I959 Hypotension, unspecified: Secondary | ICD-10-CM | POA: Diagnosis present

## 2023-11-16 DIAGNOSIS — I714 Abdominal aortic aneurysm, without rupture, unspecified: Secondary | ICD-10-CM | POA: Diagnosis present

## 2023-11-16 DIAGNOSIS — Z860100 Personal history of colon polyps, unspecified: Secondary | ICD-10-CM | POA: Diagnosis not present

## 2023-11-16 DIAGNOSIS — E861 Hypovolemia: Secondary | ICD-10-CM | POA: Diagnosis not present

## 2023-11-16 DIAGNOSIS — Z825 Family history of asthma and other chronic lower respiratory diseases: Secondary | ICD-10-CM | POA: Diagnosis not present

## 2023-11-16 DIAGNOSIS — I2510 Atherosclerotic heart disease of native coronary artery without angina pectoris: Secondary | ICD-10-CM | POA: Diagnosis not present

## 2023-11-16 DIAGNOSIS — L89152 Pressure ulcer of sacral region, stage 2: Secondary | ICD-10-CM | POA: Diagnosis present

## 2023-11-16 DIAGNOSIS — I5042 Chronic combined systolic (congestive) and diastolic (congestive) heart failure: Secondary | ICD-10-CM | POA: Diagnosis not present

## 2023-11-16 DIAGNOSIS — Z7401 Bed confinement status: Secondary | ICD-10-CM | POA: Diagnosis not present

## 2023-11-16 DIAGNOSIS — I110 Hypertensive heart disease with heart failure: Secondary | ICD-10-CM | POA: Diagnosis present

## 2023-11-16 DIAGNOSIS — Z950 Presence of cardiac pacemaker: Secondary | ICD-10-CM | POA: Diagnosis not present

## 2023-11-16 DIAGNOSIS — E869 Volume depletion, unspecified: Secondary | ICD-10-CM | POA: Diagnosis present

## 2023-11-16 DIAGNOSIS — M810 Age-related osteoporosis without current pathological fracture: Secondary | ICD-10-CM | POA: Diagnosis present

## 2023-11-16 LAB — COMPREHENSIVE METABOLIC PANEL WITH GFR
ALT: 17 U/L (ref 0–44)
AST: 16 U/L (ref 15–41)
Albumin: 2.5 g/dL — ABNORMAL LOW (ref 3.5–5.0)
Alkaline Phosphatase: 51 U/L (ref 38–126)
Anion gap: 5 (ref 5–15)
BUN: 23 mg/dL (ref 8–23)
CO2: 25 mmol/L (ref 22–32)
Calcium: 8.7 mg/dL — ABNORMAL LOW (ref 8.9–10.3)
Chloride: 106 mmol/L (ref 98–111)
Creatinine, Ser: 1.05 mg/dL (ref 0.61–1.24)
GFR, Estimated: >60 mL/min (ref >60)
Glucose, Bld: 87 mg/dL (ref 70–99)
Potassium: 4.3 mmol/L (ref 3.5–5.1)
Sodium: 136 mmol/L (ref 135–145)
Total Bilirubin: 0.9 mg/dL (ref 0.0–1.2)
Total Protein: 4.8 g/dL — ABNORMAL LOW (ref 6.5–8.1)

## 2023-11-16 LAB — CBC WITH DIFFERENTIAL/PLATELET
Abs Immature Granulocytes: 0.04 10*3/uL (ref 0.00–0.07)
Basophils Absolute: 0.1 10*3/uL (ref 0.0–0.1)
Basophils Relative: 1 %
Eosinophils Absolute: 0.3 10*3/uL (ref 0.0–0.5)
Eosinophils Relative: 6 %
HCT: 31.5 % — ABNORMAL LOW (ref 39.0–52.0)
Hemoglobin: 10 g/dL — ABNORMAL LOW (ref 13.0–17.0)
Immature Granulocytes: 1 %
Lymphocytes Relative: 49 %
Lymphs Abs: 2.4 10*3/uL (ref 0.7–4.0)
MCH: 32.4 pg (ref 26.0–34.0)
MCHC: 31.7 g/dL (ref 30.0–36.0)
MCV: 101.9 fL — ABNORMAL HIGH (ref 80.0–100.0)
Monocytes Absolute: 0.6 10*3/uL (ref 0.1–1.0)
Monocytes Relative: 11 %
Neutro Abs: 1.6 10*3/uL — ABNORMAL LOW (ref 1.7–7.7)
Neutrophils Relative %: 32 %
Platelets: 177 10*3/uL (ref 150–400)
RBC: 3.09 MIL/uL — ABNORMAL LOW (ref 4.22–5.81)
RDW: 13.4 % (ref 11.5–15.5)
WBC: 4.9 10*3/uL (ref 4.0–10.5)
nRBC: 0 % (ref 0.0–0.2)

## 2023-11-16 LAB — FERRITIN: Ferritin: 194 ng/mL (ref 24–336)

## 2023-11-16 LAB — FOLATE: Folate: 11.9 ng/mL (ref >5.9)

## 2023-11-16 LAB — IRON AND TIBC
Iron: 41 ug/dL — ABNORMAL LOW (ref 45–182)
Saturation Ratios: 17 % — ABNORMAL LOW (ref 17.9–39.5)
TIBC: 245 ug/dL — ABNORMAL LOW (ref 250–450)
UIBC: 204 ug/dL

## 2023-11-16 LAB — BRAIN NATRIURETIC PEPTIDE: B Natriuretic Peptide: 171.9 pg/mL — ABNORMAL HIGH (ref 0.0–100.0)

## 2023-11-16 LAB — MAGNESIUM: Magnesium: 1.9 mg/dL (ref 1.7–2.4)

## 2023-11-16 LAB — CORTISOL: Cortisol, Plasma: 6.6 ug/dL

## 2023-11-16 MED ORDER — LACTATED RINGERS IV SOLN: INTRAVENOUS | Status: AC

## 2023-11-16 MED ORDER — ZINC OXIDE 40 % EX OINT
TOPICAL_OINTMENT | Freq: Two times a day (BID) | CUTANEOUS | Status: DC
  Filled 2023-11-16: qty 57

## 2023-11-16 MED ORDER — COSYNTROPIN 0.25 MG IJ SOLR
0.2500 mg | Freq: Once | INTRAMUSCULAR | Status: AC
  Administered 2023-11-17: 0.25 mg via INTRAVENOUS
  Filled 2023-11-16: qty 0.25

## 2023-11-16 NOTE — Assessment & Plan Note (Signed)
 Protonix daily

## 2023-11-16 NOTE — Assessment & Plan Note (Signed)
 Continue home regimen of apixaban  Rate controlled

## 2023-11-16 NOTE — Consult Note (Addendum)
 WOC Nurse Consult Note: Reason for Consult: coccyx pressure injury  Wound type: Stage 2 Pressure Injury sacrum/coccyx  Pressure Injury POA: Yes Measurement: see nursing flowsheet  Wound bed: 100% red moist  Drainage (amount, consistency, odor) see nursing flowsheet  Periwound: intact  Dressing procedure/placement/frequency:  Cleanse sacral/coccyx wound with NS, apply xeroform gauze Timm Foot [phone]) to wound bed daily and secure with silicone foam.    Will write for Desitin 2 times daily to surrounding intact skin of buttocks.   POC discussed with bedside nurse. WOC team will not follow. Re-consult if further needs arise.   Thank you,    Ronni Colace MSN, RN-BC, [employer] [phone]

## 2023-11-16 NOTE — Progress Notes (Addendum)
 PROGRESS NOTE   MERRITT Gary Atkins  ZOX:[id number] DOB: June 02, 1941 DOA: 11/14/2023 PCP: Roslyn Coombe, MD   Date of Service: the patient was seen and examined on 11/16/2023  Brief Narrative:   83 y.o. 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   Of note, patient was recently hospitalized from 5/10 until 5/13 due to fall and resultant left hip fracture status post ORIF.  Upon evaluation in the emergency room patient was found to be exhibiting a lactic acidosis of 8.1.  Clinically the patient was felt to be volume depleted.  Hospitalist group was called to assess the patient for admission in the hospital.   Assessment & Plan Lactic acidosis Lactic acidosis has now resolved  Etiology felt to be profound volume depletion in the setting of diuretic use Lasix  from facility medication list has been held Procalcitonin, CRP, urinalysis and chest x-ray all revealed no evidence of infection Hypotension Persisting hypotension despite significant amount of intravenous hydration Continue gentle intravenous hydration as urine is still quite dark Cortisol level is surprisingly low, will obtain cosyntropin test Obtaining echocardiogram If patient is still hypotensive once euvolemia is achieved and cosyntropin test is negative will consider midodrine initiation Hold all antihypertensive therapy and diuretic therapy Pressure injury of skin Present on admission Daily dressing changes Chronic combined systolic and diastolic congestive heart failure (HCC) Holding outpatient regimen of Lasix  Monitoring closely for any evidence of volume overload as we perform intravenous volume resuscitation Paroxysmal atrial fibrillation  (HCC) Continue home regimen of apixaban  Rate controlled Coronary artery disease involving native coronary artery of native heart without angina pectoris Chest pain-free Continue apixaban  GERD without esophagitis Protonix  daily Seizure disorder (HCC) Continue Depakote  Malnutrition of moderate degree Nutrition has been consulted, their input is appreciated Daily multivitamin Nutritional supplements Encourage oral intake     Subjective:  Patient currently denies abdominal pain but complains of continued generalized weakness.  Physical Exam:  Vitals:   11/16/23 0513 11/16/23 1124 11/16/23 1250 11/16/23 1958  BP: (!) 95/57 (!) 108/56 (!) 90/59 (!) 91/59  Pulse: (!) 57 71 74 70  Resp: 20   18  Temp: 97.6 F (36.4 C)  98.1 F (36.7 C) 98.5 F (36.9 C)  TempSrc:   Oral   SpO2: 100%  100% 99%  Weight:      Height:         Constitutional: Awake alert and oriented x3, no associated distress.   Skin: no rashes, no lesions, extremely poor skin turgor noted. Eyes: Pupils are equally reactive to light.  No evidence of scleral icterus or conjunctival pallor.  ENMT: Moist mucous membranes noted.  Posterior pharynx clear of any exudate or lesions.   Respiratory: clear to auscultation bilaterally, no wheezing, no crackles. Normal respiratory effort. No accessory muscle use.  Cardiovascular: Regular rate and rhythm, no murmurs / rubs / gallops. No extremity edema. 2+ pedal pulses. No carotid bruits.  Abdomen: Abdomen is soft and nontender.  No evidence of intra-abdominal masses.  Positive bowel sounds noted in all quadrants.   Musculoskeletal: No joint deformity upper and lower extremities. Good ROM, no contractures.  Poor muscle tone   Data Reviewed:  I have personally reviewed and interpreted labs, imaging.  Significant findings are   CBC: Recent Labs  Lab 11/14/23 1353 11/15/23  0532 11/16/23 0504  WBC 9.5 7.2 4.9  NEUTROABS 6.6  --  1.6*  HGB 14.1 11.6* 10.0*  HCT 44.0  37.3* 31.5*  MCV 98.7 101.4* 101.9*  PLT 346 244 177   Basic Metabolic Panel: Recent Labs  Lab 11/14/23 1353 11/14/23 2112 11/15/23 0532 11/16/23 0504  NA 138  --  137 136  K 4.4  --  4.0 4.3  CL 103  --  105 106  CO2 19*  --  22 25  GLUCOSE 135*  --  99 87  BUN 31*  --  28* 23  CREATININE 1.49*  --  1.01 1.05  CALCIUM  9.3  --  8.8* 8.7*  MG  --  2.1  --  1.9  PHOS  --  3.6  --   --    GFR: Estimated Creatinine Clearance: 52.4 mL/min (by C-G formula based on SCr of 1.05 mg/dL). Liver Function Tests: Recent Labs  Lab 11/14/23 1353 11/15/23 0532 11/16/23 0504  AST 28 18 16   ALT 25 19 17   ALKPHOS 76 63 51  BILITOT 1.3* 1.3* 0.9  PROT 6.6 5.2* 4.8*  ALBUMIN  3.1* 2.4* 2.5*    Code Status:  Full code.  Code status decision has been confirmed with: patient Family Communication: Wife is at bedside and has been updated on plan of care   Severity of Illness:  The appropriate patient status for this patient is OBSERVATION. Observation status is judged to be reasonable and necessary in order to provide the required intensity of service to ensure the patient's safety. The patient's presenting symptoms, physical exam findings, and initial radiographic and laboratory data in the context of their medical condition is felt to place them at decreased risk for further clinical deterioration. Furthermore, it is anticipated that the patient will be medically stable for discharge from the hospital within 2 midnights of admission.   Time spent:  50 minutes  Author:  True Fuss MD  11/16/2023 11:04 PM

## 2023-11-16 NOTE — Assessment & Plan Note (Signed)
 Nutrition has been consulted, their input is appreciated Daily multivitamin Nutritional supplements Encourage oral intake

## 2023-11-16 NOTE — TOC Progression Note (Signed)
 Transition of Care [facility]) - Progression Note    Patient Details  Name: Gary Atkins MRN: [id number] Date of Birth: 1940/10/17  [facility]) CM/SW Contact  Tessie Fila, RN Phone Number: 11/16/2023, 2:57 PM  Clinical Narrative:    NCM met with pt and pt wife at bedside. Both have voiced there thoughts about a negative experience at a STR facility. At discharge they wish to return home but will need home equipment and HHPT/OT/Nurse at discharge. Pt wife states she will need a hospital bed, 3 in 1, shower chair, and RW in the home. NCM notified MD of pt/spouse wishes. TOC continuing to follow for DC needs.        Expected Discharge Plan and Services                                               Social Determinants of Health (SDOH) Interventions SDOH Screenings   Food Insecurity: No Food Insecurity (11/14/2023)  Housing: Low Risk  (11/14/2023)  Transportation Needs: No Transportation Needs (11/14/2023)  Utilities: Not At Risk (11/14/2023)  Alcohol Screen: Low Risk  (02/15/2023)  Depression (PHQ2-9): Low Risk  (06/21/2023)  Financial Resource Strain: Low Risk  (02/15/2023)  Physical Activity: Insufficiently Active (02/15/2023)  Social Connections: Moderately Isolated (11/14/2023)  Stress: No Stress Concern Present (02/15/2023)  Tobacco Use: Low Risk  (11/14/2023)  Health Literacy: Adequate Health Literacy (02/15/2023)    Readmission Risk Interventions    10/29/2023    3:08 PM  Readmission Risk Prevention Plan  Transportation Screening Complete  PCP or Specialist Appt within 5-7 Days Complete  Home Care Screening Complete  Medication Review (RN CM) Complete

## 2023-11-16 NOTE — Assessment & Plan Note (Signed)
 Lactic acidosis has now resolved  Etiology felt to be profound volume depletion in the setting of diuretic use Lasix  from facility medication list has been held Procalcitonin, CRP, urinalysis and chest x-ray all revealed no evidence of infection

## 2023-11-16 NOTE — Assessment & Plan Note (Addendum)
 Persisting hypotension despite significant amount of intravenous hydration Continue gentle intravenous hydration as urine is still quite dark Cortisol level is surprisingly low, will obtain cosyntropin test Obtaining echocardiogram If patient is still hypotensive once euvolemia is achieved and cosyntropin test is negative will consider midodrine initiation Hold all antihypertensive therapy and diuretic therapy

## 2023-11-16 NOTE — Progress Notes (Signed)
 Initial Nutrition Assessment  DOCUMENTATION CODES:   Non-severe (moderate) malnutrition in context of chronic illness  INTERVENTION:   -Ensure Plus High Protein po BID, each supplement provides 350 kcal and 20 grams of protein.   -Multivitamin with minerals daily  -Placed "High Calorie, High Protein" handout in AVS  NUTRITION DIAGNOSIS:   Moderate Malnutrition related to chronic illness as evidenced by mild fat depletion, mild muscle depletion, energy intake < or equal to 75% for > or equal to 1 month.  GOAL:   Patient will meet greater than or equal to 90% of their needs  MONITOR:   PO intake, Supplement acceptance  REASON FOR ASSESSMENT:   Consult Assessment of nutrition requirement/status  ASSESSMENT:   83 y.o. 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  Patient in room, wife at bedside. Pt feeling better today, ate 100% of his breakfast of omelet, blueberry muffin and milk. Ate some fruit as well. Denies any issues with swallowing or chewing. He drank most of a Premier Protein shake his wife brought him and is still drinking on an Ensure. Recommended he continue to drink protein shakes. At home, he eats a wide variety. Wife hopes to bring him home as pt was not thriving at rehab and pt was not eating well for weeks. Will place High calorie, high protein handout in AVS.   Per weight records, no weight loss noted but likely weight has been copied over.   Medications: Multivitamin with minerals daily, Lactated ringers   Labs reviewed.  NUTRITION - FOCUSED PHYSICAL EXAM:  Flowsheet Row Most Recent Value  Orbital Region Mild depletion  Upper Arm Region Mild depletion  Thoracic and  Lumbar Region Unable to assess  Buccal Region Moderate depletion  Temple Region Moderate depletion  Clavicle Bone Region Mild depletion  Clavicle and Acromion Bone Region Mild depletion  Scapular Bone Region Moderate depletion  Dorsal Hand Mild depletion  Edema (RD Assessment) None  Hair Reviewed  Eyes Reviewed  Mouth Reviewed  Skin Reviewed  Nails Reviewed       Diet Order:   Diet Order             Diet regular Room service appropriate? Yes; Fluid consistency: Thin  Diet effective now                   EDUCATION NEEDS:   Education needs have been addressed  Skin:  Skin Assessment: Skin Integrity Issues: Skin Integrity Issues:: Stage II Stage II: sacrum  Last BM:  5/28  Height:   Ht Readings from Last 1 Encounters:  11/14/23 5\' 9"  (1.753 m)    Weight:   Wt Readings from Last 1 Encounters:  11/14/23 74.8 kg    BMI:  Body mass index is 24.37 kg/m.  Estimated Nutritional Needs:   Kcal:  1900-2100  Protein:  80-90g  Fluid:  2L/day   Arna Better, MS, RD, LDN Inpatient Clinical Dietitian Contact via Secure chat

## 2023-11-16 NOTE — Assessment & Plan Note (Signed)
 Present on admission  Daily dressing changes

## 2023-11-16 NOTE — Assessment & Plan Note (Signed)
 Holding outpatient regimen of Lasix  Monitoring closely for any evidence of volume overload as we perform intravenous volume resuscitation

## 2023-11-16 NOTE — Evaluation (Signed)
 Physical Therapy Evaluation Patient Details Name: Gary Atkins MRN: [id number] DOB: 1940-07-27 Today's Date: 11/16/2023  History of Present Illness  83 y.o. male who presents to the ED with lightheadedness and admitted for lactic acidosis.  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 Patient was recently hospitalized from 5/10 until 5/13 due to fall and resultant left hip fracture status post hip hemiarthroplasty (posterior approach, no precautions) on 10/28/23.  Clinical Impression  Pt admitted with above diagnosis.  Pt currently with functional limitations due to the deficits listed below (see PT Problem List). Pt will benefit from acute skilled PT to increase their independence and safety with mobility to allow discharge.  Pt's BP obtained with pt resting in supine.  R arm 82/54 mmHg, L arm 78/53 mmHg, RN into room and placed cuff on Lt LE with BP 108/56 mmHg.  Due to low BPs, did not assist pt OOB today.  Spouse and pt report feeling "behind" on mobility with spouse reporting pt did "not get much PT at SNF."  So, pt assisted with performing LE exercises in supine.  Spouse states pt was only ambulating a short distance and only performed about twice at SNF prior to this admission.  She would like pt to d/c home and not return to SNF.  Pt appears slow to progress with mobility and BP still remains low so would recommend return to SNF at this time.         If plan is discharge home, recommend the following: A lot of help with walking and/or transfers;A lot of help with bathing/dressing/bathroom;Assistance with cooking/housework;Assist for transportation;Help with stairs or ramp for entrance   Can travel by private vehicle        Equipment Recommendations  Rolling walker (2 wheels);BSC/3in1;Hospital bed  Recommendations for Other Services       Functional Status Assessment Patient has had a recent decline in their functional status and demonstrates the ability to make significant improvements in function in a reasonable and predictable amount of time.     Precautions / Restrictions Precautions Precautions: Fall Restrictions LLE Weight Bearing Per Provider Order: Weight bearing as tolerated      Mobility  Bed Mobility                    Transfers                        Ambulation/Gait                  Stairs            Wheelchair Mobility     Tilt Bed    Modified Rankin (Stroke Patients Only)       Balance                                             Pertinent Vitals/Pain Pain Assessment Pain Assessment: Faces Faces Pain Scale: Hurts even more Pain Location: L hip with movement Pain Descriptors / Indicators: Sore, Aching Pain Intervention(s): Repositioned, Monitored during session    Home Living Family/patient expects to be discharged to:: Skilled nursing facility Living Arrangements: Spouse/significant other Available Help at Discharge: Family;Available 24  hours/day Type of Home: House Home Access: Stairs to enter Entrance Stairs-Rails: None Entrance Stairs-Number of Steps: 2   Home Layout: One level Home Equipment: Rollator (4 wheels)      Prior Function Prior Level of Function : Needs assist             Mobility Comments: previously walking without AD prior to Left hip hemiarthroplasty, pt admitted from SNF rehab and was ambulating very short distance with RW per spouse; spouse plans for pt to d/c home from this admission       Extremity/Trunk Assessment        Lower Extremity Assessment Lower Extremity Assessment: Generalized weakness;LLE deficits/detail LLE Deficits / Details: pt able to move L LE against gravity, observed left hip  abduction AROM and AAROM limited; observed some L lower leg edema which pt reports is chronic at times       Communication   Communication Communication: Impaired Factors Affecting Communication: Hearing impaired    Cognition Arousal: Alert Behavior During Therapy: WFL for tasks assessed/performed                             Following commands: Intact       Cueing       General Comments      Exercises General Exercises - Lower Extremity Ankle Circles/Pumps: AROM, Both, 15 reps Quad Sets: AROM, Both, 15 reps Short Arc Quad: AROM, Both, 15 reps Heel Slides: AROM, Both, 15 reps Hip ABduction/ADduction: AAROM, 10 reps, Supine, AROM, Both, Limitations Hip Abduction/Adduction Limitations: assist for L LE   Assessment/Plan    PT Assessment Patient needs continued PT services  PT Problem List Decreased strength;Decreased mobility;Decreased range of motion;Decreased activity tolerance;Decreased balance;Decreased knowledge of use of DME;Pain       PT Treatment Interventions Gait training;Therapeutic exercise;Patient/family education;Therapeutic activities;Functional mobility training;Balance training;DME instruction;Stair training    PT Goals (Current goals can be found in the Care Plan section)  Acute Rehab PT Goals PT Goal Formulation: With patient/family Time For Goal Achievement: 12/01/23 Potential to Achieve Goals: Good    Frequency Min 3X/week     Co-evaluation               AM-PAC PT "6 Clicks" Mobility  Outcome Measure Help needed turning from your back to your side while in a flat bed without using bedrails?: A Little Help needed moving from lying on your back to sitting on the side of a flat bed without using bedrails?: A Lot Help needed moving to and from a bed to a chair (including a wheelchair)?: A Lot Help needed standing up from a chair using your arms (e.g., wheelchair or bedside chair)?: A Lot Help needed to walk in hospital room?: A  Lot Help needed climbing 3-5 steps with a railing? : A Lot 6 Click Score: 13    End of Session   Activity Tolerance: Treatment limited secondary to medical complications (Comment) Patient left: in bed;with bed alarm set;with call bell/phone within reach Nurse Communication: Mobility status PT Visit Diagnosis: Muscle weakness (generalized) (M62.81);Difficulty in walking, not elsewhere classified (R26.2)    Time: 1050-1110 PT Time Calculation (min) (ACUTE ONLY): 20 min   Charges:   PT Evaluation $PT Eval Low Complexity: 1 Low   PT General Charges $$ ACUTE PT VISIT: 1 Visit        Henretta Lodge PT, DPT Physical Therapist Acute Rehabilitation Services Office: [phone]   Kati L Payson 11/16/2023, 2:09  PM

## 2023-11-16 NOTE — Progress Notes (Signed)
 OT Cancellation Note  Patient Details Name: Gary Atkins MRN: [id number] DOB: [birth year]   Cancelled Treatment:    Reason Eval/Treat Not Completed: Medical issues which prohibited therapy. Chart reviewed and discussed with RN. Pt continues to have low BP, and is experiencing increased RR. RN requests OT to hold at this time. Will continue to follow and check back as able.  Chaitra Mast L. Shandee Jergens, OTR/L  11/16/23, 2:41 PM

## 2023-11-16 NOTE — Assessment & Plan Note (Signed)
 Chest pain-free Continue apixaban 

## 2023-11-16 NOTE — Assessment & Plan Note (Signed)
Continue Depakote. 

## 2023-11-17 ENCOUNTER — Inpatient Hospital Stay (HOSPITAL_COMMUNITY)

## 2023-11-17 DIAGNOSIS — E440 Moderate protein-calorie malnutrition: Secondary | ICD-10-CM

## 2023-11-17 DIAGNOSIS — I5042 Chronic combined systolic (congestive) and diastolic (congestive) heart failure: Secondary | ICD-10-CM

## 2023-11-17 LAB — ECHOCARDIOGRAM COMPLETE
AR max vel: 3.52 cm2
AV Area VTI: 3.31 cm2
AV Area mean vel: 3.24 cm2
AV Mean grad: 2.7 mmHg
AV Peak grad: 4.9 mmHg
Ao pk vel: 1.1 m/s
Area-P 1/2: 3.03 cm2
Calc EF: 52.6 %
Height: 69 in
S' Lateral: 3.3 cm
Single Plane A2C EF: 49.6 %
Single Plane A4C EF: 58.2 %
Weight: 2641.99 [oz_av]

## 2023-11-17 LAB — COMPREHENSIVE METABOLIC PANEL WITH GFR
ALT: 17 U/L (ref 0–44)
AST: 20 U/L (ref 15–41)
Albumin: 2.4 g/dL — ABNORMAL LOW (ref 3.5–5.0)
Alkaline Phosphatase: 52 U/L (ref 38–126)
Anion gap: 7 (ref 5–15)
BUN: 24 mg/dL — ABNORMAL HIGH (ref 8–23)
CO2: 23 mmol/L (ref 22–32)
Calcium: 8.7 mg/dL — ABNORMAL LOW (ref 8.9–10.3)
Chloride: 105 mmol/L (ref 98–111)
Creatinine, Ser: 0.88 mg/dL (ref 0.61–1.24)
GFR, Estimated: >60 mL/min (ref >60)
Glucose, Bld: 98 mg/dL (ref 70–99)
Potassium: 4.5 mmol/L (ref 3.5–5.1)
Sodium: 135 mmol/L (ref 135–145)
Total Bilirubin: 0.9 mg/dL (ref 0.0–1.2)
Total Protein: 5 g/dL — ABNORMAL LOW (ref 6.5–8.1)

## 2023-11-17 LAB — CBC WITH DIFFERENTIAL/PLATELET
Abs Immature Granulocytes: 0.05 10*3/uL (ref 0.00–0.07)
Basophils Absolute: 0.1 10*3/uL (ref 0.0–0.1)
Basophils Relative: 1 %
Eosinophils Absolute: 0.2 10*3/uL (ref 0.0–0.5)
Eosinophils Relative: 4 %
HCT: 35.2 % — ABNORMAL LOW (ref 39.0–52.0)
Hemoglobin: 11.1 g/dL — ABNORMAL LOW (ref 13.0–17.0)
Immature Granulocytes: 1 %
Lymphocytes Relative: 24 %
Lymphs Abs: 1.2 10*3/uL (ref 0.7–4.0)
MCH: 32 pg (ref 26.0–34.0)
MCHC: 31.5 g/dL (ref 30.0–36.0)
MCV: 101.4 fL — ABNORMAL HIGH (ref 80.0–100.0)
Monocytes Absolute: 0.4 10*3/uL (ref 0.1–1.0)
Monocytes Relative: 8 %
Neutro Abs: 3 10*3/uL (ref 1.7–7.7)
Neutrophils Relative %: 62 %
Platelets: 209 10*3/uL (ref 150–400)
RBC: 3.47 MIL/uL — ABNORMAL LOW (ref 4.22–5.81)
RDW: 13.3 % (ref 11.5–15.5)
WBC: 4.9 10*3/uL (ref 4.0–10.5)
nRBC: 0 % (ref 0.0–0.2)

## 2023-11-17 LAB — MAGNESIUM: Magnesium: 1.8 mg/dL (ref 1.7–2.4)

## 2023-11-17 MED ORDER — ENSURE PLUS HIGH PROTEIN PO LIQD: 237.0000 mL | Freq: Two times a day (BID) | ORAL | Status: AC

## 2023-11-17 MED ORDER — PERFLUTREN LIPID MICROSPHERE
1.0000 mL | INTRAVENOUS | Status: AC | PRN
  Administered 2023-11-17: 2 mL via INTRAVENOUS

## 2023-11-17 MED ORDER — FUROSEMIDE 40 MG PO TABS: 40.0000 mg | ORAL_TABLET | Freq: Two times a day (BID) | ORAL | 3 refills | Status: AC | PRN

## 2023-11-17 MED ORDER — ADULT MULTIVITAMIN W/MINERALS CH: 1.0000 | ORAL_TABLET | Freq: Every day | ORAL | Status: DC

## 2023-11-17 NOTE — TOC Progression Note (Addendum)
 Transition of Care [facility]) - Progression Note    Patient Details  Name: Gary Atkins MRN: [id number] Date of Birth: [birth year]  [facility] Care [facility]) CM/SW Contact  Levie Ream, RN Phone Number: 11/17/2023, 1:27 PM  Clinical Narrative:    Orders received for HHPT/OT/RN and DME ( shower chair, RW, and wheelchair w/ seat cushion); spoke w/ pt in room; he agreed to recc DME and services; pt says he does not have agency preference for DME or [facility] services; he verified d/c address: [address] [place], [place] [address]; pt also requested ambulance service home;  attempted to contact pt's wife [name] [phone]) to arrange pick up/delivery of DME; awaiting return call; referral for [facility] services given to [facility] [place]; he says agency cannot provide services.  -1335- referral given to Shelvy Dickens at [facility]; she says agency can provide services; agency contact info placed in follow up provider section of d/c instructions.  -1341- spoke w/ pt's wife; she agreed to d/c plan, and would like to have DME delivered to pt's home; she would also like for bedside RN to call when [name] has arrived; [place], [place] notified.  -1348- referral for DME given to Jermaine at [facility]; he says DME will be delivered to pt's home today; he will contact pt's wife        Expected Discharge Plan and Services         Expected Discharge Date: 11/17/23                                     Social Determinants of Health (SDOH) Interventions SDOH Screenings   Food Insecurity: No Food Insecurity (11/14/2023)  Housing: Low Risk  (11/14/2023)  Transportation Needs: No Transportation Needs (11/14/2023)  Utilities: Not At Risk (11/14/2023)  Alcohol Screen: Low Risk  (02/15/2023)  Depression (PHQ2-9): Low Risk  (06/21/2023)  Financial Resource Strain: Low Risk  (02/15/2023)  Physical Activity: Insufficiently Active (02/15/2023)  Social Connections: Moderately Isolated (11/14/2023)  Stress: No Stress Concern  Present (02/15/2023)  Tobacco Use: Low Risk  (11/14/2023)  Health Literacy: Adequate Health Literacy (02/15/2023)    Readmission Risk Interventions    10/29/2023    3:08 PM  Readmission Risk Prevention Plan  Transportation Screening Complete  PCP or Specialist Appt within 5-7 Days Complete  Home Care Screening Complete  Medication Review (RN CM) Complete

## 2023-11-17 NOTE — Evaluation (Signed)
 Occupational Therapy Evaluation Patient Details Name: Gary Atkins MRN: [id number] DOB: 08/19/1940 Today's Date: 11/17/2023   History of Present Illness   83 yr old male who presents with lightheadedness, weakness, and dizziness. 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 Patient was recently hospitalized from 10/27/23 until 10/30/23 due to fall and resultant left hip fracture status post hip hemiarthroplasty (posterior approach, no precautions) on 10/28/23.     Clinical Impressions The pt is currently presenting significantly below his baseline level of functioning for self-care management, as he is limited by the below listed deficits (see OT problem list). During the session, he required min assist for supine to sit, max assist for lower body dressing, and min assist to stand from an elevated bed using a RW. He gently declined to transfer to the bedside chair, stating he was not feeling up for that today. He denied any feelings of lightheadedness and dizziness throughout the session. His blood pressure was taken and noted to be: 93/61 supine at the start of the session, 108/68 seated EOB, 100/85 standing, and 117/74 supine at the end of the session. He will benefit from further OT services to maximize his independence with self-care tasks, and to decrease the risk for restricted participation in meaningful activities. SNF rehab is recommended, however the pt's spouse declines this and stated she plans to take the pt home at discharge.      If plan is discharge home, recommend the following:   Help with stairs or ramp for entrance;Assistance with cooking/housework;Assist for transportation;A lot of help with bathing/dressing/bathroom     Functional Status  Assessment   Patient has had a recent decline in their functional status and demonstrates the ability to make significant improvements in function in a reasonable and predictable amount of time.     Equipment Recommendations   Other (comment) (drop arm bedside commode)     Recommendations for Other Services         Precautions/Restrictions   Precautions Precautions: Fall Restrictions Weight Bearing Restrictions Per Provider Order: No LLE Weight Bearing Per Provider Order: Weight bearing as tolerated     Mobility Bed Mobility Overal bed mobility: Needs Assistance Bed Mobility: Supine to Sit, Sit to Supine     Supine to sit: Min assist, HOB elevated, Used rails Sit to supine: Min assist, Used rails        Transfers Overall transfer level: Needs assistance Equipment used: Rolling walker (2 wheels) Transfers: Sit to/from Stand Sit to Stand: Min assist, From elevated surface           General transfer comment: required cues for hand placement and trunk extension in standing      Balance     Sitting balance-Leahy Scale:  (fair+)       Standing balance-Leahy Scale: Poor             ADL either performed or assessed with clinical judgement   ADL Overall ADL's : Needs assistance/impaired Eating/Feeding: Independent;Sitting   Grooming: Set up;Sitting           Upper Body Dressing : Minimal assistance;Sitting   Lower Body Dressing: Maximal assistance;Sitting/lateral leans           Vision Baseline Vision/History: 1 Wears glasses Additional Comments: He correctly read the time depicted on the wall clock.  Pertinent Vitals/Pain Pain Assessment Pain Assessment: No/denies pain     Extremity/Trunk Assessment Upper Extremity Assessment Upper Extremity Assessment: Overall WFL for tasks assessed;Right hand dominant   Lower Extremity Assessment Lower Extremity Assessment: Generalized weakness       Communication  Communication Factors Affecting Communication: Hearing impaired   Cognition Arousal: Alert Behavior During Therapy: WFL for tasks assessed/performed Cognition: No apparent impairments        Following commands: Intact                  Home Living Family/patient expects to be discharged to:: Private residence Living Arrangements: Spouse/significant other Available Help at Discharge: Family Type of Home: House Home Access: Stairs to enter Secretary/administrator of Steps: 2   Home Layout: One level     Bathroom Shower/Tub: Runner, broadcasting/film/video: Rollator (4 wheels);Cane - single point   Additional Comments: He was admitted to the hospital from a SNF where he has been receiving rehab over the past couple weeks.      Prior Functioning/Environment Prior Level of Function : Independent/Modified Independent             Mobility Comments: Prior to L hip hemiarthroplasty a couple weeks ago, he was independent with ambulation. While at the SNF for rehab, he ambulated short distances with assistance using the parallel bars. ADLs Comments: Prior to his  L hip hemiarthroplasty a couple weeks ago, he was modified independent to independent with ADLS, his spouse performed most of the cooking and cleaning, and he did minimal driving.    OT Problem List: Decreased strength;Decreased activity tolerance;Impaired balance (sitting and/or standing);Decreased knowledge of use of DME or AE   OT Treatment/Interventions: Self-care/ADL training;Therapeutic exercise;Therapeutic activities;Energy conservation;Patient/family education;DME and/or AE instruction;Balance training      OT Goals(Current goals can be found in the care plan section)   Acute Rehab OT Goals OT Goal Formulation: With patient/family Time For Goal Achievement: 12/01/23 Potential to Achieve Goals: Good ADL Goals Pt Will Perform Lower Body Dressing: with contact guard assist;sitting/lateral  leans;with adaptive equipment;sit to/from stand Pt Will Transfer to Toilet: with contact guard assist;ambulating;bedside commode Pt Will Perform Toileting - Clothing Manipulation and hygiene: with contact guard assist;sit to/from stand   OT Frequency:  Min 2X/week       AM-PAC OT "6 Clicks" Daily Activity     Outcome Measure Help from another person eating meals?: None Help from another person taking care of personal grooming?: A Little Help from another person toileting, which includes using toliet, bedpan, or urinal?: A Lot Help from another person bathing (including washing, rinsing, drying)?: A Lot Help from another person to put on and taking off regular upper body clothing?: A Little Help from another person to put on and taking off regular lower body clothing?: A Lot 6 Click Score: 16   End of Session Equipment Utilized During Treatment: Gait belt;Rolling walker (2 wheels) Nurse Communication: Mobility status  Activity Tolerance: Patient tolerated treatment well Patient left: in bed;with call bell/phone within reach;with bed alarm set;with family/visitor present  OT Visit Diagnosis: History of falling (Z91.81);Muscle weakness (generalized) (M62.81);Unsteadiness on feet (R26.81)                Time: 1005-1047 OT Time Calculation (min): 42 min Charges:  OT General Charges $OT Visit: 1 Visit OT Evaluation $OT Eval Moderate Complexity: 1 Mod OT Treatments $Therapeutic Activity: 8-22 mins    Sheralyn Dies, OTR/L 11/17/2023, 12:28  PM

## 2023-11-17 NOTE — Plan of Care (Signed)

## 2023-11-17 NOTE — Discharge Summary (Signed)
 Physician Discharge Summary   Patient: Gary Atkins MRN: [id number] DOB: September 30, 1940  Admit date:     11/14/2023  Discharge date: 11/17/2023  Discharge Physician: True Fuss   PCP: Roslyn Coombe, MD   Recommendations at discharge:    Please take all prescribed medications exactly as instructed including changing your use of Lasix  to only taking it as needed for greater than 2 pounds weight gain in one day or 5 pounds weight gain in one week. Please consume a regular diet. Please increase your physical activity as tolerated using an assistive device or with assistance.  Please use compression stockings whenever standing or walking or extended periods of time. You have a small wound over your sacrum.  Care for it the following way:    Cleanse sacral/coccyx wound with water , apply xeroform gauze to wound bed daily and secure with silicone foam.  Change daily.    Please maintain all outpatient follow-up appointments including follow-up with your primary care provider.  Please return to the emergency department if you develop worsening weakness, light headedness or loss of consciousness. A Home Health referral has been placed for you to receive physical therapy and occupational therapy at time of discharge.  You should be contacted in the next few days to establish care.   Discharge Diagnoses: Principal Problem:   Lactic acidosis Active Problems:   Coronary artery disease involving native coronary artery of native heart without angina pectoris   Seizure disorder (HCC)   GERD without esophagitis   Paroxysmal atrial fibrillation (HCC)   Hypotension   Hypovolemia   Pressure injury of skin   Chronic combined systolic and diastolic congestive heart failure (HCC)   Malnutrition of moderate degree  Resolved Problems:   * No resolved hospital problems. *   Hospital Course: 83 y.o. 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   Of note, patient was recently hospitalized from 5/10 until 5/13 due to fall and resultant left hip fracture status post ORIF.  Upon evaluation in the emergency room patient was found to be exhibiting a lactic acidosis of 8.1.  Clinically the patient was felt to be volume depleted.  The hospitalist group was then called to assess the patient for admission in the hospital.  The patient was evaluated for possible causes of infection and workup was found to be negative.  The etiology of the lactic acidosis was felt to be profound volume depletion in the setting of outpatient diuretic use.  After discontinuation of the patient's diuretic and intravenous hydration the patient's lactic acidosis resolved.    During this hospitalization the patient was also observed to exhibit significant hypotension despite adequate intravenous volume resuscitation.  Home antihypertensive therapy was discontinued.  Due to patient's recent weight loss, patient was felt to no longer need his previous blood pressure regimen.  Furthermore upon review of prior hospitalizations, blood pressures were thought to be similar.  Patient was additionally provided with compression stockings to be used when standing and ambulating.   Patient was also noted to have a sacral wound during this hospitalization identified to be present on admission.  Local wound care was performed daily and patient was provided with supplies for continued care at time of discharge in addition to follow up being arranged.  Patient was discharged home in improved and stable condition on 11/17/2023.    Pain control - [place]  Controlled Substance Reporting System database was reviewed. and patient was instructed, not to  drive, operate heavy machinery, perform activities at heights, swimming or participation in water  activities or provide baby-sitting services while on Pain, Sleep and Anxiety Medications; until their outpatient Physician has advised to do so again. Also recommended to not to take more than prescribed Pain, Sleep and Anxiety Medications.   Consultants: none Procedures performed: none  Disposition: Home Diet recommendation:  Regular diet  DISCHARGE MEDICATION: Allergies as of 11/17/2023       Reactions   Demerol [meperidine] Other (See Comments)   seizure   Tramadol  Other (See Comments)   Seizure        Medication List     STOP taking these medications    lisinopril  5 MG tablet Commonly known as: ZESTRIL        TAKE these medications    B-12 PO Take 2 tablets by mouth daily.   divalproex  500 MG 24 hr tablet Commonly known as: DEPAKOTE  ER Take 1 tablet by mouth twice daily   Eliquis  5 MG Tabs tablet Generic drug: apixaban  Take 1 tablet by mouth twice daily   feeding supplement Liqd Take 237 mLs by mouth 2 (two) times daily between meals.   ferrous sulfate  325 (65 FE) MG tablet Take 1 tablet (325 mg total) by mouth 3 (three) times daily after meals.   furosemide  40 MG tablet Commonly known as: LASIX  Take 1 tablet (40 mg total) by mouth 3 times/day as needed-between meals & bedtime (only take if your weight increases by more than 2 pounds in one day or 5 pounds in one week). What changed:  when to take this reasons to take this   latanoprost  0.005 % ophthalmic solution Commonly known as: XALATAN  Place 1 drop into both eyes at bedtime.   multivitamin with minerals Tabs tablet Take 1 tablet by mouth daily.   NON FORMULARY Apply 1 application  topically daily as needed (nail fungus). Antifungal toenail solution from [place], faxed on 01/14/2019   PRESERVISION AREDS PO Take 1 tablet by mouth daily.   senna 8.6 MG Tabs tablet Commonly known as:  SENOKOT Take 1 tablet (8.6 mg total) by mouth 2 (two) times daily.               Discharge Care Instructions  (From admission, onward)           Start     Ordered   11/17/23 0000  Discharge wound care:       Comments: Cleanse sacral/coccyx wound with water  then apply  apply xeroform gauze to wound bed daily and secure with silicone foam.   11/17/23 1250            Follow-up Information     Roslyn Coombe, MD. Schedule an appointment as soon as possible for a visit in 2 week(s).   Specialties: Internal Medicine, Radiology Contact information: [address] [place] [address] [phone]         [facility] [place], [place] Follow up.   Contact information: [address] [place] [address] [phone]                 Discharge Exam: Filed Weights   11/14/23 1315 11/16/23 2300  Weight: 74.8 kg 74.9 kg    Constitutional: Awake alert and oriented x3, no  associated distress.   Respiratory: clear to auscultation bilaterally, no wheezing, no crackles. Normal respiratory effort. No accessory muscle use.  Cardiovascular: Regular rate and rhythm, no murmurs / rubs / gallops. No extremity edema. 2+ pedal pulses. No carotid bruits.  Abdomen: Abdomen is soft and nontender.  No evidence of intra-abdominal masses.  Positive bowel sounds noted in all quadrants.   Musculoskeletal: No joint deformity upper and lower extremities. Good ROM, no contractures. Normal muscle tone.     Condition at discharge: fair  The results of significant diagnostics from this hospitalization (including imaging, microbiology, ancillary and laboratory) are listed below for reference.   Imaging Studies: CUP PACEART REMOTE DEVICE CHECK Result Date: 11/29/2023 ILR summary report received. Battery status OK. Normal device function. No new symptom, tachy, brady, or pause episodes. No new AF episodes. Monthly summary reports and ROV/PRN LA,  CVRS  ECHOCARDIOGRAM COMPLETE Result Date: 11/17/2023    ECHOCARDIOGRAM REPORT   Patient Name:   CAMAR GUYTON Date of Exam: 11/17/2023 Medical Rec #:  [id number]     Height:       69.0 in Accession #:    [id number]    Weight:       165.1 lb Date of Birth:  1940/10/25     BSA:          1.904 m Patient Age:    82 years      BP:           104/67 mmHg Patient Gender: M             HR:           76 bpm. Exam Location:  Inpatient Procedure: 2D Echo, Cardiac Doppler, Color Doppler and Intracardiac            Opacification Agent (Both Spectral and Color Flow Doppler were            utilized during procedure). Indications:    I50.40* Unspecified combined systolic (congestive) and diastolic                 (congestive) heart failure  History:        Patient has prior history of Echocardiogram examinations, most                 recent 06/13/2022. CHF and Cardiomyopathy, CAD, Abnormal ECG,                 Arrythmias:Atrial Fibrillation and Bradycardia,                 Signs/Symptoms:Syncope and Chest Pain; Risk                 Factors:Dyslipidemia.  Sonographer:    Raynelle Callow RDCS Referring Phys: [id number] Merrill Ab[facility]  Sonographer Comments: Technically difficult study due to poor echo windows, Technically challenging study due to limited acoustic windows, suboptimal apical window and suboptimal subcostal window. Suboptimal windows even with Definity . IMPRESSIONS  1. Left ventricular ejection fraction, by estimation, is 55 to 60%. The left ventricle has normal function. Left ventricular endocardial border not optimally defined to evaluate regional wall motion. There is mild left ventricular hypertrophy. Left ventricular diastolic parameters are consistent with Grade I diastolic dysfunction (impaired relaxation).  2. Right ventricular systolic function was not well visualized. The right ventricular size is not well visualized.  3. The mitral valve is normal in structure. No evidence of mitral valve regurgitation. No evidence  of mitral stenosis.  4. The aortic valve was not  well visualized. There is mild calcification of the aortic valve. There is mild thickening of the aortic valve. Aortic valve regurgitation is not visualized. Aortic valve sclerosis is present, with no evidence of aortic valve  stenosis.  5. Aortic dilatation noted. There is mild dilatation of the ascending aorta, measuring 43 mm. There is mild dilatation of the aortic root, measuring 42 mm.  6. The inferior vena cava is normal in size with greater than 50% respiratory variability, suggesting right atrial pressure of 3 mmHg. FINDINGS  Left Ventricle: Left ventricular ejection fraction, by estimation, is 55 to 60%. The left ventricle has normal function. Left ventricular endocardial border not optimally defined to evaluate regional wall motion. Definity  contrast agent was given IV to delineate the left ventricular endocardial borders. The left ventricular internal cavity size was normal in size. There is mild left ventricular hypertrophy. Left ventricular diastolic parameters are consistent with Grade I diastolic dysfunction (impaired relaxation). Right Ventricle: The right ventricular size is not well visualized. Right vetricular wall thickness was not well visualized. Right ventricular systolic function was not well visualized. Left Atrium: Left atrial size was normal in size. Right Atrium: Right atrial size was normal in size. Pericardium: There is no evidence of pericardial effusion. Mitral Valve: The mitral valve is normal in structure. Mild mitral annular calcification. No evidence of mitral valve regurgitation. No evidence of mitral valve stenosis. Tricuspid Valve: The tricuspid valve is normal in structure. Tricuspid valve regurgitation is not demonstrated. No evidence of tricuspid stenosis. Aortic Valve: The aortic valve was not well visualized. There is mild calcification of the aortic valve. There is mild thickening of the aortic valve. There is mild aortic  valve annular calcification. Aortic valve regurgitation is not visualized. Aortic valve sclerosis is present, with no evidence of aortic valve stenosis. Aortic valve mean gradient measures 2.7 mmHg. Aortic valve peak gradient measures 4.9 mmHg. Aortic valve area, by VTI measures 3.31 cm. Pulmonic Valve: The pulmonic valve was not well visualized. Pulmonic valve regurgitation is not visualized. No evidence of pulmonic stenosis. Aorta: Aortic dilatation noted. There is mild dilatation of the ascending aorta, measuring 43 mm. There is mild dilatation of the aortic root, measuring 42 mm. Venous: The inferior vena cava is normal in size with greater than 50% respiratory variability, suggesting right atrial pressure of 3 mmHg. IAS/Shunts: The interatrial septum was not well visualized.  LEFT VENTRICLE PLAX 2D LVIDd:         4.80 cm      Diastology LVIDs:         3.30 cm      LV e' medial:    9.68 cm/s LV PW:         1.10 cm      LV E/e' medial:  5.5 LV IVS:        1.20 cm      LV e' lateral:   9.68 cm/s LVOT diam:     2.50 cm      LV E/e' lateral: 5.5 LV SV:         71 LV SV Index:   37 LVOT Area:     4.91 cm  LV Volumes (MOD) LV vol d, MOD A2C: 103.0 ml LV vol d, MOD A4C: 90.9 ml LV vol s, MOD A2C: 51.9 ml LV vol s, MOD A4C: 38.0 ml LV SV MOD A2C:     51.1 ml LV SV MOD A4C:     90.9 ml LV SV MOD BP:  53.0 ml RIGHT VENTRICLE RV S prime:     8.59 cm/s TAPSE (M-mode): 1.3 cm LEFT ATRIUM             Index        RIGHT ATRIUM           Index LA diam:        3.70 cm 1.94 cm/m   RA Area:     12.30 cm LA Vol (A2C):   39.5 ml 20.74 ml/m  RA Volume:   23.50 ml  12.34 ml/m LA Vol (A4C):   26.6 ml 13.97 ml/m LA Biplane Vol: 32.8 ml 17.22 ml/m  AORTIC VALVE AV Area (Vmax):    3.52 cm AV Area (Vmean):   3.24 cm AV Area (VTI):     3.31 cm AV Vmax:           110.33 cm/s AV Vmean:          75.872 cm/s AV VTI:            0.214 m AV Peak Grad:      4.9 mmHg AV Mean Grad:      2.7 mmHg LVOT Vmax:         79.10 cm/s LVOT  Vmean:        50.100 cm/s LVOT VTI:          0.144 m LVOT/AV VTI ratio: 0.67  AORTA Ao Root diam: 4.20 cm Ao Asc diam:  4.20 cm MITRAL VALVE MV Area (PHT): 3.03 cm    SHUNTS MV Decel Time: 250 msec    Systemic VTI:  0.14 m MV E velocity: 53.30 cm/s  Systemic Diam: 2.50 cm MV A velocity: 74.70 cm/s MV E/A ratio:  0.71 Armida Lander MD Electronically signed by Armida Lander MD Signature Date/Time: 11/17/2023/6:47:26 PM    Final    DG Chest 1 View Result Date: 11/16/2023 CLINICAL DATA:  Possible pulmonary edema EXAM: PORTABLE CHEST 1 VIEW COMPARISON:  11/14/2023 FINDINGS: Cardiac shadow is within normal limits. Loop recorder is again noted. Lungs are well aerated bilaterally. Mild left basilar atelectasis and small effusion is seen. No findings to suggest pulmonary edema or vascular congestion are noted. IMPRESSION: Mild left basilar atelectasis and effusion. Electronically Signed   By: Violeta Grey M.D.   On: 11/16/2023 16:38   CT Angio Chest PE W and/or Wo Contrast Result Date: 11/14/2023 CLINICAL DATA:  Pulmonary embolism (PE) suspected, high prob EXAM: CT ANGIOGRAPHY CHEST WITH CONTRAST TECHNIQUE: Multidetector CT imaging of the chest was performed using the standard protocol during bolus administration of intravenous contrast. Multiplanar CT image reconstructions and MIPs were obtained to evaluate the vascular anatomy. RADIATION DOSE REDUCTION: This exam was performed according to the departmental dose-optimization program which includes automated exposure control, adjustment of the mA and/or kV according to patient size and/or use of iterative reconstruction technique. CONTRAST:  75mL OMNIPAQUE  IOHEXOL  350 MG/ML SOLN COMPARISON:  October 17, 2022 FINDINGS: Pulmonary Embolism: No pulmonary embolism. Cardiovascular: No cardiomegaly or pericardial effusion. 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 Scattered calcified aortic atherosclerosis. Dense multi-vessel coronary atherosclerosis.  Mediastinum/Nodes: No mediastinal mass. No mediastinal, hilar, or axillary lymphadenopathy. Small hiatal hernia. Lungs/Pleura: The midline trachea and bronchi are patent. No focal airspace consolidation, pleural effusion, or pneumothorax. Musculoskeletal: No acute fracture or destructive bone lesion. Diffuse osteopenia. Multilevel degenerative disc disease of the spine. Likely chronic compression fractures of T12-L2 and L4. Thoracic DISH. Small volume symmetric bilateral gynecomastia. Upper Abdomen: No acute abnormality in  the partially visualized upper abdomen. Cholecystectomy. Scattered colonic diverticulosis. Nonobstructive nephrolithiasis in each kidney. No hydronephrosis. Review of the MIP images confirms the above findings. IMPRESSION: 1. No acute intrathoracic abnormality; specifically, no pulmonary embolism, pneumonia, or pleural effusion. 2. 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 Recommend annual imaging followup by CTA or MRA. This recommendation follows 2010 ACCF/AHA/AATS/ACR/ASA/SCA/SCAI/SIR/STS/SVM Guidelines for the Diagnosis and Management of Patients with Thoracic Aortic Disease. Circulation. 2010; 121: G956-O130. Aortic aneurysm NOS (ICD10-I71.9) Electronically Signed   By: Rance Burrows M.D.   On: 11/14/2023 17:16   DG Chest Portable 1 View Result Date: 11/14/2023 CLINICAL DATA:  Shortness of breath. EXAM: PORTABLE CHEST 1 VIEW COMPARISON:  10/17/2022 FINDINGS: Normal sized heart. Tortuous aorta. Cardiac loop recorder. Minimal left basilar atelectasis laterally. Otherwise, clear lungs with normal vascularity. Lower thoracic spine degenerative changes. IMPRESSION: Minimal left basilar atelectasis. Electronically Signed   By: Catherin Closs M.D.   On: 11/14/2023 15:33    Microbiology: Results for orders placed or performed during the hospital encounter of 11/14/23  Culture, blood (routine x 2)     Status: None   Collection Time: 11/14/23  2:29 PM   Specimen: BLOOD   Result Value Ref Range Status   Specimen Description   Final    BLOOD LEFT ANTECUBITAL Performed at [facility], [address] [place], [place] [address]    Special Requests   Final    BOTTLES DRAWN AEROBIC AND ANAEROBIC Blood Culture adequate volume Performed at [facility], [address] [place], [place] [address]    Culture   Final    NO GROWTH 5 DAYS Performed at [facility], [address], [place], [place] [address]    Report Status 11/19/2023 FINAL  Final  Culture, blood (routine x 2)     Status: None   Collection Time: 11/15/23  5:32 AM   Specimen: BLOOD RIGHT ARM  Result Value Ref Range Status   Specimen Description   Final    BLOOD RIGHT ARM Performed at [facility], [address], [place], [place] [address]    Special Requests   Final    BOTTLES DRAWN AEROBIC ONLY Blood Culture adequate volume Performed at [facility], [address], [place], [place] [address]    Culture   Final    NO GROWTH 5 DAYS Performed at [facility], [address], [place], [place] [address]    Report Status 11/20/2023 FINAL  Final    Labs: CBC: No results for input(s): WBC, NEUTROABS, HGB, HCT, MCV, PLT in the last 168 hours.  Basic Metabolic Panel: No results for input(s): NA, K, CL, CO2, GLUCOSE, BUN, CREATININE, CALCIUM , MG, PHOS in the last 168 hours.  Liver Function Tests: No results for input(s): AST, ALT, ALKPHOS, BILITOT, PROT, ALBUMIN  in the last 168 hours.  CBG: No results for input(s): GLUCAP in the last 168 hours.  Discharge time spent: greater than 30 minutes.  Signed: True Fuss, MD [facility] 12/03/2023

## 2023-11-17 NOTE — Discharge Instructions (Signed)
 Please take all prescribed medications exactly as instructed including changing your use of Lasix  to only taking it as needed for greater than 2 pounds weight gain in one day or 5 pounds weight gain in one week. Please consume a regular diet. Please increase your physical activity as tolerated using an assistive device or with assistance.  Please use compression stockings whenever standing or walking or extended periods of time. You have a small wound over your sacrum.  Care for it the following way:      Cleanse sacral/coccyx wound with water , apply xeroform gauze to wound bed daily and secure with silicone foam.  Change daily.    Please maintain all outpatient follow-up appointments including follow-up with your primary care provider.  Please return to the emergency department if you develop worsening weakness, light headedness or loss of consciousness. A Home Health referral has been placed for you to receive physical therapy and occupational therapy at time of discharge.  You should be contacted in the next few days to establish care.

## 2023-11-17 NOTE — Plan of Care (Signed)
  Problem: Education: Goal: Knowledge of General Education information will improve Description: Including pain rating scale, medication(s)/side effects and non-pharmacologic comfort measures Outcome: Progressing   Problem: Clinical Measurements: Goal: Will remain free from infection Outcome: Progressing   Problem: Nutrition: Goal: Adequate nutrition will be maintained Outcome: Progressing   Problem: Elimination: Goal: Will not experience complications related to bowel motility Outcome: Progressing Goal: Will not experience complications related to urinary retention Outcome: Progressing   Problem: Pain Managment: Goal: General experience of comfort will improve and/or be controlled Outcome: Progressing   Problem: Safety: Goal: Ability to remain free from injury will improve Outcome: Progressing

## 2023-11-17 NOTE — TOC Transition Note (Signed)
 Transition of Care [facility]) - Discharge Note   Patient Details  Name: Gary Atkins MRN: [id number] Date of Birth: 21-Dec-1940  Transition of Care [facility]) CM/SW Contact:  Levie Ream, RN Phone Number: 11/17/2023, 2:02 PM   Clinical Narrative:    D/C orders received; pt will d/c home w/ PTAR; pt and wife agree to d/c plan; d/c address confirmed: [address] [place], [place] [address]; HHRN/PT/OT arranged w/ [place] at [facility]; confirmed w/ Jermaine at [facility] DME will be delivered to pt's home today; Rosco Companion, [place] will call pt's wife when PTAR arrived; PTAR called at 1405; spoke w/ Operator # [phone]; no TOC needs.  Final next level of care: Home w Home Health Services Barriers to Discharge: No Barriers Identified   Patient Goals and CMS Choice            Discharge Placement                  Name of family member notified: Yaden Seith (spouse) [phone] Patient and family notified of of transfer: 11/17/23  Discharge Plan and Services Additional resources added to the After Visit Summary for                  DME Arranged: Bedside commode, Shower stool, Walker rolling DME Agency: [employer] Date DME Agency Contacted: 11/17/23 Time DME Agency Contacted: 1355 Representative spoke with at DME Agency: [employer] HH Arranged: RN, PT, OT HH Agency: Other - See comment [facility]) Date HH Agency Contacted: 11/17/23 Time HH Agency Contacted: 1335 Representative spoke with at [facility] Agency: Mont Antis at [employer]  Social Drivers of Health (SDOH) Interventions SDOH Screenings   Food Insecurity: No Food Insecurity (11/14/2023)  Housing: Low Risk  (11/14/2023)  Transportation Needs: No Transportation Needs (11/14/2023)  Utilities: Not At Risk (11/14/2023)  Alcohol Screen: Low Risk  (02/15/2023)  Depression (PHQ2-9): Low Risk  (06/21/2023)  Financial Resource Strain: Low Risk  (02/15/2023)  Physical Activity: Insufficiently Active (02/15/2023)  Social Connections:  Moderately Isolated (11/14/2023)  Stress: No Stress Concern Present (02/15/2023)  Tobacco Use: Low Risk  (11/14/2023)  Health Literacy: Adequate Health Literacy (02/15/2023)     Readmission Risk Interventions    10/29/2023    3:08 PM  Readmission Risk Prevention Plan  Transportation Screening Complete  PCP or Specialist Appt within 5-7 Days Complete  Home Care Screening Complete  Medication Review (RN CM) Complete

## 2023-11-19 ENCOUNTER — Telehealth: Payer: Self-pay | Admitting: *Deleted

## 2023-11-19 DIAGNOSIS — I493 Ventricular premature depolarization: Secondary | ICD-10-CM | POA: Diagnosis not present

## 2023-11-19 DIAGNOSIS — I5042 Chronic combined systolic (congestive) and diastolic (congestive) heart failure: Secondary | ICD-10-CM | POA: Diagnosis not present

## 2023-11-19 DIAGNOSIS — I480 Paroxysmal atrial fibrillation: Secondary | ICD-10-CM | POA: Diagnosis not present

## 2023-11-19 DIAGNOSIS — I110 Hypertensive heart disease with heart failure: Secondary | ICD-10-CM | POA: Diagnosis not present

## 2023-11-19 DIAGNOSIS — M80052D Age-related osteoporosis with current pathological fracture, left femur, subsequent encounter for fracture with routine healing: Secondary | ICD-10-CM | POA: Diagnosis not present

## 2023-11-19 LAB — CULTURE, BLOOD (ROUTINE X 2)
Culture: NO GROWTH
Special Requests: ADEQUATE

## 2023-11-19 NOTE — Transitions of Care (Post Inpatient/ED Visit) (Signed)
   11/19/2023  Name: Gary Atkins MRN: [id number] DOB: 06-Nov-1940  Today's TOC FU Call Status: Today's TOC FU Call Status:: Unsuccessful Call (1st Attempt) Unsuccessful Call (1st Attempt) Date: 11/19/23  Attempted to reach the patient regarding the most recent Inpatient/ED visit.  Follow Up Plan: Additional outreach attempts will be made to reach the patient to complete the Transitions of Care (Post Inpatient/ED visit) call.   Arna Better RN, BSN [place]  [facility] RN Care Manager [phone]

## 2023-11-20 ENCOUNTER — Telehealth: Payer: Self-pay | Admitting: *Deleted

## 2023-11-20 ENCOUNTER — Telehealth: Payer: Self-pay | Admitting: Internal Medicine

## 2023-11-20 DIAGNOSIS — I493 Ventricular premature depolarization: Secondary | ICD-10-CM | POA: Diagnosis not present

## 2023-11-20 DIAGNOSIS — S72142A Displaced intertrochanteric fracture of left femur, initial encounter for closed fracture: Secondary | ICD-10-CM | POA: Diagnosis not present

## 2023-11-20 DIAGNOSIS — M80052D Age-related osteoporosis with current pathological fracture, left femur, subsequent encounter for fracture with routine healing: Secondary | ICD-10-CM | POA: Diagnosis not present

## 2023-11-20 DIAGNOSIS — I5042 Chronic combined systolic (congestive) and diastolic (congestive) heart failure: Secondary | ICD-10-CM | POA: Diagnosis not present

## 2023-11-20 DIAGNOSIS — I480 Paroxysmal atrial fibrillation: Secondary | ICD-10-CM | POA: Diagnosis not present

## 2023-11-20 DIAGNOSIS — R531 Weakness: Secondary | ICD-10-CM | POA: Diagnosis not present

## 2023-11-20 DIAGNOSIS — I110 Hypertensive heart disease with heart failure: Secondary | ICD-10-CM | POA: Diagnosis not present

## 2023-11-20 LAB — CULTURE, BLOOD (ROUTINE X 2)
Culture: NO GROWTH
Special Requests: ADEQUATE

## 2023-11-20 NOTE — Transitions of Care (Post Inpatient/ED Visit) (Signed)
 11/20/2023  Name: Gary Atkins MRN: [id number] DOB: April 14, 1941  Today's TOC FU Call Status: Today's TOC FU Call Status:: Successful TOC FU Call Completed TOC FU Call Complete Date: 11/20/23 Patient's Name and Date of Birth confirmed.  Transition Care Management Follow-up Telephone Call Date of Discharge: 11/17/23 Discharge Facility: [facility]) Type of Discharge: Inpatient Admission Primary Inpatient Discharge Diagnosis:: hypoxia/ hypotension- lactic acidosis; readmission post recent (L) hip fracture with surgical replacement How have you been since you were released from the hospital?: Better ("I am doing okay; the home health people came out yesterday and sais the little wound on my bottom is practically gone already- they said we got to it in time, it is almost healed up. Using the walker; they are installing a ramp this week at my home") Any questions or concerns?: No  Successfully enrolled into 30-day TOC program  Items Reviewed: Did you receive and understand the discharge instructions provided?: Yes (thoroughly reviewed with patient and his spouse who verbalize good understanding of same) Medications obtained,verified, and reconciled?: Yes (Medications Reviewed) (Full medication reconciliation/ review completed; no concerns or discrepancies identified; confirmed patient obtained/ is taking all newly Rx'd medications as instructed; spouse-manages medications and denies questions/ concerns around medications today) Any new allergies since your discharge?: No Dietary orders reviewed?: Yes Type of Diet Ordered:: "Healthy as I can" Do you have support at home?: Yes People in Home [RPT]: spouse Name of Support/Comfort Primary Source: Reports essentially independent in self-care activities; supportive spouse assists as/ if needed/ indicated  Medications Reviewed Today: Medications Reviewed Today     Reviewed by Neysa Arts M, RN (Registered Nurse) on 11/20/23 at 1111  Med List  Status: <None>   Medication Order Taking? Sig Documenting Provider Last Dose Status Informant  apixaban  (ELIQUIS ) 5 MG TABS tablet [id number] Yes Take 1 tablet by mouth twice daily Tammie Fall, MD Taking Active Self, Pharmacy Records           Med Note [facility] [place], [place] A   Wed Nov 14, 2023  9:54 PM) Pt states that medication was d/c when he was admitted two weeks ago.  atorvastatin  (LIPITOR) 20 MG tablet [id number] Yes Take 20 mg by mouth daily. 11/20/23- Reports TOC call, patient is currently taking; reports has taken "for a long time" per PCP instructions Roslyn Coombe, MD Taking Active Spouse/Significant Other           Med Note (Nakeisha Greenhouse M   Tue Nov 20, 2023 10:50 AM) 11/20/23- Reports TOC call, patient is currently taking; reports has taken "for a long time" per PCP instructions   Cyanocobalamin  (B-12 PO) [id number] Yes Take 2 tablets by mouth daily. [provider] Taking Active Self, Pharmacy Records  divalproex  (DEPAKOTE  ER) 500 MG 24 hr tablet [id number] Yes Take 1 tablet by mouth twice daily Roslyn Coombe, MD Taking Active Self, Pharmacy Records  feeding supplement (ENSURE PLUS HIGH PROTEIN) LIQD [id number] Yes Take 237 mLs by mouth 2 (two) times daily between meals. Shalhoub, Merrill Abide, MD Taking Active   ferrous sulfate  325 (65 FE) MG tablet [id number] No Take 1 tablet (325 mg total) by mouth 3 (three) times daily after meals.  Patient not taking: Reported on 11/20/2023   Magdalene School, MD Not Taking Active Self, Pharmacy Records  furosemide  (LASIX ) 40 MG tablet [id number] Yes Take 1 tablet (40 mg total) by mouth 3 times/day as needed-between meals & bedtime (only take if your weight increases by more than 2 pounds  in one day or 5 pounds in one week). Shalhoub, Merrill Abide, MD Taking Active   latanoprost  (XALATAN ) 0.005 % ophthalmic solution [id number] Yes Place 1 drop into both eyes at bedtime. [provider] Taking Active Self, Pharmacy Records  Multiple  Vitamin (MULTIVITAMIN WITH MINERALS) TABS tablet [id number] Yes Take 1 tablet by mouth daily. Shalhoub, Merrill Abide, MD Taking Active   Multiple Vitamins-Minerals (PRESERVISION AREDS PO) [id number] Yes Take 1 tablet by mouth daily. [provider] Taking Active Self, Pharmacy Records  NON FORMULARY [id number] Yes Apply 1 application  topically daily as needed (nail fungus). Antifungal toenail solution from [place], faxed on 01/14/2019 [provider] Taking Active Self, Pharmacy Records  senna (SENOKOT) 8.6 MG TABS tablet [id number] Yes Take 1 tablet (8.6 mg total) by mouth 2 (two) times daily. Magdalene School, MD Taking Active Self, Pharmacy Records           Home Care and Equipment/Supplies: Were Home Health Services Ordered?: Yes Name of Home Health Agency:: [facility]- RN/ PT/ OT Has Agency set up a time to come to your home?: Yes First Home Health Visit Date: 11/20/23 Any new equipment or medical supplies ordered?: Yes (BSC; Shower stool; rolling walker) Name of Medical supply agency?: [employer] Were you able to get the equipment/medical supplies?: Yes Do you have any questions related to the use of the equipment/supplies?: No  Functional Questionnaire: Do you need assistance with bathing/showering or dressing?: Yes (spouse provides supervision/ assistance as needed/ indicated) Do you need assistance with meal preparation?: Yes (spouse prepares meals) Do you need assistance with eating?: No Do you have difficulty maintaining continence: No Do you need assistance with getting out of bed/getting out of a chair/moving?: No Do you have difficulty managing or taking your medications?: Yes (spouse manages all aspects of medication administration)  Follow up appointments reviewed: PCP Follow-up appointment confirmed?: Yes (care coordination outreach in real-time with scheduling care guide to successfully schedule hospital follow up PCP appointment 12/05/23- per patient and  spouse request around timing of appointment) Date of PCP follow-up appointment?: 12/05/23 Follow-up Provider: PCP Specialist Hospital Follow-up appointment confirmed?: No Reason Specialist Follow-Up Not Confirmed: Patient has Specialist Provider Number and will Call for Appointment Do you need transportation to your follow-up appointment?: No Do you understand care options if your condition(s) worsen?: Yes-patient verbalized understanding  SDOH Interventions Today    Flowsheet Row Most Recent Value  SDOH Interventions   Food Insecurity Interventions Intervention Not Indicated  Housing Interventions Intervention Not Indicated  Transportation Interventions Intervention Not Indicated  [reports spouse/ family provide transportation]  Utilities Interventions Intervention Not Indicated      See TOC assessment tabs for additional assessment/ TOC intervention information  Plan for next week's call: Review medication adherence Review/ assess leg swelling/ use of prn diuretic; stage 2 sacral pressure injury site Ongoing use of walker; status of construction of exterior ramp Review home health RN/ PT/ OT visits Review upcoming provider appointments  Pls call/ message for questions,  Kerby Hockley Mckinney Margeaux Swantek, RN, BSN, Media planner  Transitions of Care  [facility] - [facility] [phone]: direct office

## 2023-11-20 NOTE — Telephone Encounter (Signed)
 Copied from CRM [id number]. Topic: Clinical - Home Health Verbal Orders >> Nov 20, 2023  3:03 PM Taleah C wrote: Caller/Agency: [facility] Callback Number: [phone] (VM secured) Service Requested: Physical Therapy Frequency: 2 week 3, 1 week 3 Any new concerns about the patient? No

## 2023-11-20 NOTE — Telephone Encounter (Signed)
 Ok for [employer]

## 2023-11-20 NOTE — Telephone Encounter (Signed)
 Copied from CRM [id number]. Topic: Clinical - Home Health Verbal Orders >> Nov 20, 2023 12:32 PM Artemio Larry wrote: Caller/Agency: Velia Gess from [facility]  Callback Number: [phone] Service Requested: Skilled Nursing Frequency: 1 week for 3 weeks to monitor left incision, clean dry and in tact at this time  Any new concerns about the patient? No, PT and OT will do evaluation - extremely low blood pressure 91/63

## 2023-11-20 NOTE — Patient Instructions (Signed)
 Visit Information  Thank you for taking time to visit with me today. Please don't hesitate to contact me if I can be of assistance to you before our next scheduled telephone appointment.  Our next appointment is by telephone on Wednesday November 28, 2023 at 10:15 am- with nurse Barbie  Please call the care guide team at [phone] if you need to cancel or reschedule your appointment.   Patient Self Care Activities:  Attend all scheduled provider appointments Call provider office for new concerns or questions  Participate in Transition of Care Program/Attend TOC scheduled calls Take medications as prescribed   Use assistive devices as needed to prevent falls Continue pacing activity as your recuperation from recent surgery continues, but stay as active as you are able Please continue to monitor and change the dressings on the area on your bottom that is healing: keep it clean and dry, change the dressings daily, avoid prolonged pressure in that area, and make sure you eat a diet that includes good sources of protein Continue working with the home health team that is involved in your care If you believe your condition is getting worse- contact your care providers (doctors) promptly- reaching out to your doctor early when you have concerns can prevent you from having to go to the hospital  Following is a copy of your care plan:   Goals Addressed             This Visit's Progress    [facility]I Transitions of Care (TOC) Care Plan   On track    Problems:  Recent Hospitalization for treatment of CHF 2 recent unplanned hospital admissions within 30 days: May 10-13, 2025: (L) hip fracture with surgical arthroplasty: discharged to short-term SNF/ rehabilitation May 28-31, 2025: hypoxia and hypotension/ lactic acidosis; small stage 2 sacral decubitus ulcer noted at time of hospital admission Independent at baseline- resides with supportive spouse- spouse currently providing transportation: exterior  ramp construction currently in progress- declined PCP office visit for hospital follow up sooner than 12/05/23 Home Health services for RN/ PT/ OT: confirmed active  Goal:  Over the next 30 days, the patient will not experience hospital readmission  Interventions:  Transitions of Care: 11/20/23:  week # 1/ day # 1 Durable Medical Equipment (DME) needs assessed with patient/caregiver Doctor Visits  - discussed the importance of doctor visits Communication with PCP re: enrollment into TOC 30-day program Arranged PCP follow-up within 12-14 days (Care Guide Scheduled) Post discharge activity limitations prescribed by provider reviewed Post-op wound/incision care reviewed with patient/caregiver Reviewed Signs and symptoms of infection Home Health RN/ PT/ OT services through [facility]- confirmed active; initial visit scheduled for this afternoon; provided education around role of home health services with importance of participation/ ongoing engagement; encouraged patient's active participation/ engagement- confirmed assessment visit occurred yesterday Patient reports "the nurse yesterday said the wound on my bottom is practically already healed up- said we got it in time and it looked good;" confirmed he/ spouse have good understanding of wound care instructions and are monitoring regularly at home and performing dressing changes every day  Provided education around basic care of pressure injury: need to keep clean/ dry/ monitor daily with dressing changes; high protein diet; avoid pressure; stay as active as possible without over-doing: spouse and patient both verbalize very good baseline understanding of same Provided education around benefit of conservative post-hospital discharge activity; need to pace activity without over-doing  Confirmed currently requiring/ using assistive devices for ambulation - walker; BSC; provided education/  reinforcement around fall prevention  Successfully enrolled into  30-day TOC program- provided my direct contact information should questions/ concerns/ needs arise post-TOC call, prior to next RN CM telephone visit     Heart Failure Interventions: Reviewed role of diuretics in prevention of fluid overload and management of heart failure; Discussed the importance of keeping all appointments with provider Provided patient with education about the role of exercise in the management of heart failure Assessed social determinant of health barriers  Confirmed currently basing prn use of diuretic around peripheral swelling in legs:  spouse reports she noticed some swelling "last night;" gave patient one half-dose of his prn diuretic (lasix ): reports "looks much better this morning" signs and symptoms other than weight gain that indicate fluid retention- they are currently monitoring for peripheral edema daily- give prn diuretic around swelling: spouse reports "this is how we have been doing it since the surgery"  Patient Self Care Activities:  Attend all scheduled provider appointments Call provider office for new concerns or questions  Participate in Transition of Care Program/Attend TOC scheduled calls Take medications as prescribed   Use assistive devices as needed to prevent falls Continue pacing activity as your recuperation from recent surgery continues, but stay as active as you are able Please continue to monitor and change the dressings on the area on your bottom that is healing: keep it clean and dry, change the dressings daily, avoid prolonged pressure in that area, and make sure you eat a diet that includes good sources of protein Continue working with the home health team that is involved in your care If you believe your condition is getting worse- contact your care providers (doctors) promptly- reaching out to your doctor early when you have concerns can prevent you from having to go to the hospital  Plan:  Telephone follow up appointment with care  management team member scheduled for:  Wednesday 11/28/23 at 10:15 am- with covering RN CM; Thursday 12/06/23 at 10:15 am with primary RN CM  Plan for next week's call: Review medication adherence Review/ assess leg swelling/ use of prn diuretic; stage 2 sacral pressure injury site Ongoing use of walker; status of construction of exterior ramp Review home health RN/ PT/ OT visits Review upcoming provider appointments        Patient verbalizes understanding of instructions and care plan provided today and agrees to view in MyChart. Active MyChart status and patient understanding of how to access instructions and care plan via MyChart confirmed with patient.     Telephone follow up appointment with care management team member scheduled for: 11/28/23  at 10:15 am  If you are experiencing a Mental Health or Behavioral Health Crisis or need someone to talk to, please  call the Suicide and Crisis Lifeline: 988 call the [place]  National Suicide Prevention Lifeline: 437 049 1457 or TTY: 774-678-0696 TTY (458) 128-7020) to talk to a trained counselor call 1-800-273-TALK (toll free, 24 hour hotline) go to [facility] [address] [place] [phone]) call the [facility] Crisis Line: 747-734-4920 call 911   Erlene Hawks, RN, BSN, CCRN Alumnus RN Care Manager  Transitions of Care  [facility]BC[facility] - Population Health  [place] [phone]: direct office

## 2023-11-21 NOTE — Telephone Encounter (Signed)
 Ok for [employer]

## 2023-11-22 DIAGNOSIS — M80052D Age-related osteoporosis with current pathological fracture, left femur, subsequent encounter for fracture with routine healing: Secondary | ICD-10-CM | POA: Diagnosis not present

## 2023-11-22 DIAGNOSIS — I110 Hypertensive heart disease with heart failure: Secondary | ICD-10-CM | POA: Diagnosis not present

## 2023-11-22 DIAGNOSIS — I480 Paroxysmal atrial fibrillation: Secondary | ICD-10-CM | POA: Diagnosis not present

## 2023-11-22 DIAGNOSIS — I5042 Chronic combined systolic (congestive) and diastolic (congestive) heart failure: Secondary | ICD-10-CM | POA: Diagnosis not present

## 2023-11-22 DIAGNOSIS — I493 Ventricular premature depolarization: Secondary | ICD-10-CM | POA: Diagnosis not present

## 2023-11-22 NOTE — Telephone Encounter (Signed)
 Called and gave verbals for both.

## 2023-11-23 ENCOUNTER — Inpatient Hospital Stay: Admitting: Internal Medicine

## 2023-11-23 DIAGNOSIS — I480 Paroxysmal atrial fibrillation: Secondary | ICD-10-CM | POA: Diagnosis not present

## 2023-11-23 DIAGNOSIS — I493 Ventricular premature depolarization: Secondary | ICD-10-CM | POA: Diagnosis not present

## 2023-11-23 DIAGNOSIS — I110 Hypertensive heart disease with heart failure: Secondary | ICD-10-CM | POA: Diagnosis not present

## 2023-11-23 DIAGNOSIS — M80052D Age-related osteoporosis with current pathological fracture, left femur, subsequent encounter for fracture with routine healing: Secondary | ICD-10-CM | POA: Diagnosis not present

## 2023-11-23 DIAGNOSIS — I5042 Chronic combined systolic (congestive) and diastolic (congestive) heart failure: Secondary | ICD-10-CM | POA: Diagnosis not present

## 2023-11-27 DIAGNOSIS — I5042 Chronic combined systolic (congestive) and diastolic (congestive) heart failure: Secondary | ICD-10-CM | POA: Diagnosis not present

## 2023-11-27 DIAGNOSIS — M80052D Age-related osteoporosis with current pathological fracture, left femur, subsequent encounter for fracture with routine healing: Secondary | ICD-10-CM | POA: Diagnosis not present

## 2023-11-27 DIAGNOSIS — I493 Ventricular premature depolarization: Secondary | ICD-10-CM | POA: Diagnosis not present

## 2023-11-27 DIAGNOSIS — I110 Hypertensive heart disease with heart failure: Secondary | ICD-10-CM | POA: Diagnosis not present

## 2023-11-27 DIAGNOSIS — I480 Paroxysmal atrial fibrillation: Secondary | ICD-10-CM | POA: Diagnosis not present

## 2023-11-28 ENCOUNTER — Other Ambulatory Visit: Payer: Self-pay

## 2023-11-28 DIAGNOSIS — M80052D Age-related osteoporosis with current pathological fracture, left femur, subsequent encounter for fracture with routine healing: Secondary | ICD-10-CM | POA: Diagnosis not present

## 2023-11-28 DIAGNOSIS — I110 Hypertensive heart disease with heart failure: Secondary | ICD-10-CM | POA: Diagnosis not present

## 2023-11-28 DIAGNOSIS — I493 Ventricular premature depolarization: Secondary | ICD-10-CM | POA: Diagnosis not present

## 2023-11-28 DIAGNOSIS — I480 Paroxysmal atrial fibrillation: Secondary | ICD-10-CM | POA: Diagnosis not present

## 2023-11-28 DIAGNOSIS — I5042 Chronic combined systolic (congestive) and diastolic (congestive) heart failure: Secondary | ICD-10-CM | POA: Diagnosis not present
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
## 2023-11-28 NOTE — Patient Instructions (Signed)
 Visit Information  Thank you for taking time to visit with me today. Please don't hesitate to contact me if I can be of assistance to you before our next scheduled telephone appointment.  Our next appointment is by telephone on 12/06/23  at 1015 am. Thursday 12/06/23 at 10:15 am with primary RN CM  Following is a copy of your care plan:   Goals Addressed             This Visit's Progress    [facility] Transitions of Care (TOC) Care Plan   On track    Problems:  Recent Hospitalization for treatment of CHF 2 recent unplanned hospital admissions within 30 days: May 10-13, 2025: (L) hip fracture with surgical arthroplasty: discharged to short-term SNF/ rehabilitation May 28-31, 2025: hypoxia and hypotension/ lactic acidosis; small stage 2 sacral decubitus ulcer noted at time of hospital admission Independent at baseline- resides with supportive spouse- spouse currently providing transportation: exterior ramp construction currently in progress- declined PCP office visit for hospital follow up sooner than 12/05/23 Home Health services for RN/ PT/ OT: confirmed active and ongoing as of 11/28/23.   Goal:  Over the next 30 days, the patient will not experience hospital readmission  Interventions: Reviewed or Updated 11/28/23.  Transitions of Care: 11/28/23:  Week # 2/ Scheduled Call Successful./Discharge date 11/17/23.  Durable Medical Equipment (DME) needs assessed with patient/caregiver Doctor Visits  - discussed the importance of doctor visits Communication with PCP re: enrollment into TOC 30-day program Arranged PCP follow-up within 12-14 days (Care Guide Scheduled) Post discharge activity limitations prescribed by provider reviewed Post-op wound/incision care reviewed with patient/caregiver Reviewed Signs and symptoms of infection Home Health RN/ PT/ OT services through [facility]- confirmed active and ongoing as of 11/28/23; reviewed education around role of home health services with importance of  participation/ ongoing engagement; encouraged patient's active participation/ engagement. Patient reports and spouse report continuing to monitor regularly at home and performing assessments daily. Provided education around basic care of pressure injury: Updated 11/28/23. Reported as healed per spouse.  Continue to avoid pressure; stay as active as possible without over-doing: spouse and patient both verbalize very good baseline understanding of same Reviewed education around benefit of conservative post-hospital discharge activity; need to pace activity without over-doing  Re-confirmed currently requiring/ using assistive devices for ambulation - walker; BSC; provided education/ reinforcement around fall prevention    Heart Failure Interventions: Reviewed or Updated 11/28/23.  Reviewed role of diuretics in prevention of fluid overload and management of heart failure; Discussed the importance of keeping all appointments with provider Provided patient with education about the role of exercise in the management of heart failure Assessed social determinant of health barriers  Confirmed currently basing prn use of diuretic around peripheral swelling in legs:   Reviewed and discussed signs and symptoms other than weight gain that indicate fluid retention- they are currently monitoring for peripheral edema daily- give prn diuretic around swelling: spouse reports this is how we have been doing it since the surgery Spouse is now measuring ankle circumference daily using cm and keeping a log along with blood pressure, heart rate, SpO2, and daily weights. Updated 11/28/23.   Patient Self Care Activities: Reviewed or Updated 11/28/23.  Attend all scheduled provider appointments Call provider office for new concerns or questions  Participate in Transition of Care Program/Attend TOC scheduled calls Take medications as prescribed   Use assistive devices as needed to prevent falls Continue pacing activity as  your recuperation from recent surgery continues,  but stay as active as you are able Please continue to monitor and avoid prolonged pressure in that area, and make sure you eat a diet that includes good sources of protein Continue working with the home health team that is involved in your care If you believe your condition is getting worse- contact your care providers (doctors) promptly- reaching out to your doctor early when you have concerns can prevent you from having to go to the hospital  Plan:  Telephone follow up appointment with care management team member scheduled for:  Thursday 12/06/23 at 10:15 am with primary RN CM  Plan for next week's call: Updated 11/28/23.  Review medication adherence Review/ assess leg swelling/ use of prn diuretic; stage 2 sacral pressure injury site Ongoing use of walker; status of use of completed exterior ramp Review home health RN/ PT/ OT visits Review upcoming provider appointments: PCP and possibly orthopedic surgeon.  Status of PCP appointment Trends in weights, blood pressure, heart rate, ankle measurements/diuretic dosage per spouse or per PCP changes.          Patient verbalizes understanding of instructions and care plan provided today and agrees to view in MyChart. Active MyChart status and patient understanding of how to access instructions and care plan via MyChart confirmed with patient.     Telephone follow up appointment with care management team member scheduled [id number] 12/06/23 at 10:15 am with primary RN CM  Please call the care guide team at [phone] if you need to cancel or reschedule your appointment.   Please call the Suicide and Crisis Lifeline: 988 call the [place]  National Suicide Prevention Lifeline: 339-245-5295 or TTY: 435-370-0168 TTY 7376578547) to talk to a trained counselor if you are experiencing a Mental Health or Behavioral Health Crisis or need someone to talk to.   Katheryn Pandy MSN, RN RN Case  [employer] [facility]  [facility]Population [facility] Office Hours M-F [phone] Direct Dial: [phone] Main Phone [phone]  Fax: [phone] [email or web address]

## 2023-11-29 ENCOUNTER — Ambulatory Visit (INDEPENDENT_AMBULATORY_CARE_PROVIDER_SITE_OTHER)

## 2023-11-29 DIAGNOSIS — I428 Other cardiomyopathies: Secondary | ICD-10-CM | POA: Diagnosis not present

## 2023-11-29 LAB — CUP PACEART REMOTE DEVICE CHECK
Date Time Interrogation Session: 20250611233413
Implantable Pulse Generator Implant Date: 20240702

## 2023-11-30 DIAGNOSIS — I5042 Chronic combined systolic (congestive) and diastolic (congestive) heart failure: Secondary | ICD-10-CM | POA: Diagnosis not present

## 2023-11-30 DIAGNOSIS — I110 Hypertensive heart disease with heart failure: Secondary | ICD-10-CM | POA: Diagnosis not present

## 2023-11-30 DIAGNOSIS — I480 Paroxysmal atrial fibrillation: Secondary | ICD-10-CM | POA: Diagnosis not present

## 2023-11-30 DIAGNOSIS — I493 Ventricular premature depolarization: Secondary | ICD-10-CM | POA: Diagnosis not present

## 2023-11-30 DIAGNOSIS — M80052D Age-related osteoporosis with current pathological fracture, left femur, subsequent encounter for fracture with routine healing: Secondary | ICD-10-CM | POA: Diagnosis not present

## 2023-12-01 ENCOUNTER — Ambulatory Visit: Payer: Self-pay | Admitting: Cardiology

## 2023-12-03 DIAGNOSIS — I493 Ventricular premature depolarization: Secondary | ICD-10-CM | POA: Diagnosis not present

## 2023-12-03 DIAGNOSIS — I110 Hypertensive heart disease with heart failure: Secondary | ICD-10-CM | POA: Diagnosis not present

## 2023-12-03 DIAGNOSIS — I5042 Chronic combined systolic (congestive) and diastolic (congestive) heart failure: Secondary | ICD-10-CM | POA: Diagnosis not present

## 2023-12-03 DIAGNOSIS — I480 Paroxysmal atrial fibrillation: Secondary | ICD-10-CM | POA: Diagnosis not present

## 2023-12-03 DIAGNOSIS — M80052D Age-related osteoporosis with current pathological fracture, left femur, subsequent encounter for fracture with routine healing: Secondary | ICD-10-CM | POA: Diagnosis not present

## 2023-12-05 ENCOUNTER — Ambulatory Visit (INDEPENDENT_AMBULATORY_CARE_PROVIDER_SITE_OTHER): Admitting: Internal Medicine

## 2023-12-05 ENCOUNTER — Encounter: Payer: Self-pay | Admitting: Internal Medicine

## 2023-12-05 VITALS — BP 126/74 | HR 90 | Temp 98.9°F | Ht 69.0 in | Wt 166.0 lb

## 2023-12-05 DIAGNOSIS — I493 Ventricular premature depolarization: Secondary | ICD-10-CM | POA: Diagnosis not present

## 2023-12-05 DIAGNOSIS — R739 Hyperglycemia, unspecified: Secondary | ICD-10-CM

## 2023-12-05 DIAGNOSIS — I5042 Chronic combined systolic (congestive) and diastolic (congestive) heart failure: Secondary | ICD-10-CM | POA: Diagnosis not present

## 2023-12-05 DIAGNOSIS — I480 Paroxysmal atrial fibrillation: Secondary | ICD-10-CM | POA: Diagnosis not present

## 2023-12-05 DIAGNOSIS — R269 Unspecified abnormalities of gait and mobility: Secondary | ICD-10-CM | POA: Insufficient documentation

## 2023-12-05 DIAGNOSIS — M80052D Age-related osteoporosis with current pathological fracture, left femur, subsequent encounter for fracture with routine healing: Secondary | ICD-10-CM | POA: Diagnosis not present

## 2023-12-05 DIAGNOSIS — E7800 Pure hypercholesterolemia, unspecified: Secondary | ICD-10-CM | POA: Diagnosis not present

## 2023-12-05 DIAGNOSIS — I110 Hypertensive heart disease with heart failure: Secondary | ICD-10-CM | POA: Diagnosis not present

## 2023-12-05 NOTE — Assessment & Plan Note (Signed)
 Lab Results  Component Value Date   LDLCALC 68 06/21/2023   Stable, pt to continue current statin lipitor 20 mg qd

## 2023-12-05 NOTE — Patient Instructions (Signed)
 You are given the script for the [facility] today  Please continue all other medications as before, and refills have been done if requested.  Please have the pharmacy call with any other refills you may need.  Please keep your appointments with your specialists as you may have planned  Please make an Appointment to return in 3 months, or sooner if needed

## 2023-12-05 NOTE — Assessment & Plan Note (Signed)
 Improved post rehab - ok to try graduation to quad cane use

## 2023-12-05 NOTE — Assessment & Plan Note (Signed)
 Overal stable, cont current med tx
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
## 2023-12-05 NOTE — Assessment & Plan Note (Signed)
 Lab Results  Component Value Date   HGBA1C 5.9 06/21/2023   Stable, pt to continue current medical treatment   - diet,wt control

## 2023-12-06 ENCOUNTER — Telehealth: Payer: Self-pay | Admitting: Internal Medicine

## 2023-12-06 ENCOUNTER — Telehealth: Payer: Self-pay | Admitting: *Deleted

## 2023-12-06 ENCOUNTER — Encounter: Payer: Self-pay | Admitting: *Deleted

## 2023-12-06 NOTE — Telephone Encounter (Signed)
 Copied from CRM [id number]. Topic: Clinical - Medical Advice >> Dec 06, 2023 10:50 AM Dimple Francis wrote: Reason for CRM: Patient was given a prescription for a cane and the insurance company said that an authorization needs to be sent in, Please reach out to patient when available

## 2023-12-07 ENCOUNTER — Telehealth: Payer: Self-pay | Admitting: *Deleted

## 2023-12-07 ENCOUNTER — Encounter: Payer: Self-pay | Admitting: *Deleted

## 2023-12-07 DIAGNOSIS — M80052D Age-related osteoporosis with current pathological fracture, left femur, subsequent encounter for fracture with routine healing: Secondary | ICD-10-CM | POA: Diagnosis not present

## 2023-12-07 DIAGNOSIS — I480 Paroxysmal atrial fibrillation: Secondary | ICD-10-CM | POA: Diagnosis not present

## 2023-12-07 DIAGNOSIS — I5042 Chronic combined systolic (congestive) and diastolic (congestive) heart failure: Secondary | ICD-10-CM | POA: Diagnosis not present

## 2023-12-07 DIAGNOSIS — I493 Ventricular premature depolarization: Secondary | ICD-10-CM | POA: Diagnosis not present

## 2023-12-07 DIAGNOSIS — I110 Hypertensive heart disease with heart failure: Secondary | ICD-10-CM | POA: Diagnosis not present

## 2023-12-10 ENCOUNTER — Other Ambulatory Visit: Payer: Self-pay | Admitting: *Deleted

## 2023-12-10 NOTE — Patient Instructions (Signed)
 Visit Information  Thank you for taking time to visit with me today. Please don't hesitate to contact me if I can be of assistance to you before our next scheduled telephone appointment.  Our next appointment is by telephone on Thursday 12/20/23 at 10:00 am  Please call the care guide team at [phone] if you need to cancel or reschedule your appointment.   Following are the goals we discussed today:  Patient Self Care Activities:  Attend all scheduled provider appointments Call provider office for new concerns or questions  Participate in Transition of Care Program/Attend TOC scheduled calls Take medications as prescribed   Use assistive devices as needed to prevent falls Continue pacing activity as your recuperation from recent surgery continues, but stay as active as you are able Continue working with the home health team that is involved in your care Continue monitoring your daily weights and blood pressures at home If you believe your condition is getting worse- contact your care providers (doctors) promptly- reaching out to your doctor early when you have concerns can prevent you from having to go to the hospital  If you are experiencing a Mental Health or Behavioral Health Crisis or need someone to talk to, please  call the Suicide and Crisis Lifeline: 988 call the [place]  National Suicide Prevention Lifeline: 5316032300 or TTY: 865-830-2633 TTY 330-015-1455) to talk to a trained counselor call 1-800-273-TALK (toll free, 24 hour hotline) go to [facility] [address], [place] [phone]) call the [facility] Crisis Line: 2063618892 call 911   Patient verbalizes understanding of instructions and care plan provided today and agrees to view in MyChart. Active MyChart status and patient understanding of how to access instructions and care plan via MyChart confirmed with patient.     Pls call/ message for questions,  Gary Atkins  Gary Akin Yi, RN, BSN, CCRN Alumnus RN Care Manager  Transitions of Care  [facility] - [facility] [phone]: direct office
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
## 2023-12-12 DIAGNOSIS — I493 Ventricular premature depolarization: Secondary | ICD-10-CM | POA: Diagnosis not present

## 2023-12-12 DIAGNOSIS — I5042 Chronic combined systolic (congestive) and diastolic (congestive) heart failure: Secondary | ICD-10-CM | POA: Diagnosis not present

## 2023-12-12 DIAGNOSIS — M80052D Age-related osteoporosis with current pathological fracture, left femur, subsequent encounter for fracture with routine healing: Secondary | ICD-10-CM | POA: Diagnosis not present

## 2023-12-12 DIAGNOSIS — I480 Paroxysmal atrial fibrillation: Secondary | ICD-10-CM | POA: Diagnosis not present

## 2023-12-12 DIAGNOSIS — I110 Hypertensive heart disease with heart failure: Secondary | ICD-10-CM | POA: Diagnosis not present

## 2023-12-12 NOTE — Progress Notes (Signed)
 [facility] Summary Report / Loop Recorder

## 2023-12-13 ENCOUNTER — Ambulatory Visit: Admitting: Podiatry

## 2023-12-13 ENCOUNTER — Encounter: Payer: Self-pay | Admitting: Podiatry

## 2023-12-13 DIAGNOSIS — M79674 Pain in right toe(s): Secondary | ICD-10-CM

## 2023-12-13 DIAGNOSIS — B351 Tinea unguium: Secondary | ICD-10-CM

## 2023-12-13 DIAGNOSIS — M79675 Pain in left toe(s): Secondary | ICD-10-CM

## 2023-12-13 DIAGNOSIS — S72042D Displaced fracture of base of neck of left femur, subsequent encounter for closed fracture with routine healing: Secondary | ICD-10-CM | POA: Diagnosis not present

## 2023-12-13 NOTE — Progress Notes (Signed)
This patient returns to the office for evaluation and treatment of long thick painful nails .  This patient is unable to trim his own nails since the patient cannot reach his feet.  Patient says the nails are painful walking and wearing his shoes.  He returns for preventive foot care services.  General Appearance  Alert, conversant and in no acute stress.  Vascular  Dorsalis pedis and posterior tibial  pulses are palpable  bilaterally.  Capillary return is within normal limits  bilaterally. Temperature is within normal limits  bilaterally.  Neurologic  Senn-Weinstein monofilament wire test within normal limits  bilaterally. Muscle power within normal limits bilaterally.  Nails Thick disfigured discolored nails with subungual debris  from hallux to fifth toes bilaterally. No evidence of bacterial infection or drainage bilaterally.  Orthopedic  No limitations of motion  feet .  No crepitus or effusions noted.  No bony pathology or digital deformities noted.  Skin  normotropic skin with no porokeratosis noted bilaterally.  No signs of infections or ulcers noted.     Onychomycosis  Pain in toes right foot  Pain in toes left foot  Debridement  of nails  1-5  B/L with a nail nipper.  Nails were then filed using a dremel tool with no incidents.    RTC  12 weeks    Leilyn Frayre DPM  

## 2023-12-14 DIAGNOSIS — M80052D Age-related osteoporosis with current pathological fracture, left femur, subsequent encounter for fracture with routine healing: Secondary | ICD-10-CM | POA: Diagnosis not present

## 2023-12-14 DIAGNOSIS — I5042 Chronic combined systolic (congestive) and diastolic (congestive) heart failure: Secondary | ICD-10-CM | POA: Diagnosis not present

## 2023-12-14 DIAGNOSIS — I110 Hypertensive heart disease with heart failure: Secondary | ICD-10-CM | POA: Diagnosis not present

## 2023-12-14 DIAGNOSIS — I480 Paroxysmal atrial fibrillation: Secondary | ICD-10-CM | POA: Diagnosis not present

## 2023-12-14 DIAGNOSIS — I493 Ventricular premature depolarization: Secondary | ICD-10-CM | POA: Diagnosis not present

## 2023-12-18 DIAGNOSIS — I493 Ventricular premature depolarization: Secondary | ICD-10-CM | POA: Diagnosis not present

## 2023-12-18 DIAGNOSIS — I110 Hypertensive heart disease with heart failure: Secondary | ICD-10-CM | POA: Diagnosis not present

## 2023-12-18 DIAGNOSIS — I480 Paroxysmal atrial fibrillation: Secondary | ICD-10-CM | POA: Diagnosis not present

## 2023-12-18 DIAGNOSIS — I5042 Chronic combined systolic (congestive) and diastolic (congestive) heart failure: Secondary | ICD-10-CM | POA: Diagnosis not present

## 2023-12-18 DIAGNOSIS — M80052D Age-related osteoporosis with current pathological fracture, left femur, subsequent encounter for fracture with routine healing: Secondary | ICD-10-CM | POA: Diagnosis not present

## 2023-12-19 DIAGNOSIS — I110 Hypertensive heart disease with heart failure: Secondary | ICD-10-CM | POA: Diagnosis not present

## 2023-12-19 DIAGNOSIS — I5042 Chronic combined systolic (congestive) and diastolic (congestive) heart failure: Secondary | ICD-10-CM | POA: Diagnosis not present

## 2023-12-19 DIAGNOSIS — I493 Ventricular premature depolarization: Secondary | ICD-10-CM | POA: Diagnosis not present

## 2023-12-19 DIAGNOSIS — M80052D Age-related osteoporosis with current pathological fracture, left femur, subsequent encounter for fracture with routine healing: Secondary | ICD-10-CM | POA: Diagnosis not present

## 2023-12-19 DIAGNOSIS — I480 Paroxysmal atrial fibrillation: Secondary | ICD-10-CM | POA: Diagnosis not present

## 2023-12-20 ENCOUNTER — Other Ambulatory Visit: Payer: Self-pay | Admitting: *Deleted

## 2023-12-20 VITALS — BP 118/72 | Wt 164.0 lb

## 2023-12-20 DIAGNOSIS — I428 Other cardiomyopathies: Secondary | ICD-10-CM

## 2023-12-20 DIAGNOSIS — I5042 Chronic combined systolic (congestive) and diastolic (congestive) heart failure: Secondary | ICD-10-CM

## 2023-12-20 DIAGNOSIS — S72142A Displaced intertrochanteric fracture of left femur, initial encounter for closed fracture: Secondary | ICD-10-CM | POA: Diagnosis not present

## 2023-12-20 DIAGNOSIS — S72012A Unspecified intracapsular fracture of left femur, initial encounter for closed fracture: Secondary | ICD-10-CM
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
## 2023-12-20 NOTE — Patient Instructions (Signed)
 Visit Information  Thank you for taking time to visit with me today. Please don't hesitate to contact me if I can be of assistance to you before our next scheduled telephone appointment.  It has been a pleasure working with you over the last 30 days!  Great job managing your care after your hospital visit!  I am glad that you are doing well!  Please listen for a call from the scheduling care guide to schedule a phone call with the new nurse care manager who will pick up in your care where we are leaving off today  Following are the goals we discussed today:  Patient Self Care Activities:  Attend all scheduled provider appointments Call provider office for new concerns or questions  Take medications as prescribed   Use assistive devices as needed to prevent falls- your cane and walker Continue pacing activity as your recuperation from recent surgery continues, but stay as active as you are able Continue working with the home health team that is involved in your care Continue monitoring your daily weights and blood pressures at home If you believe your condition is getting worse- contact your care providers (doctors) promptly- reaching out to your doctor early when you have concerns can prevent you from having to go to the hospital  If you are experiencing a Mental Health or Behavioral Health Crisis or need someone to talk to, please  call the Suicide and Crisis Lifeline: 988 call the [place]  National Suicide Prevention Lifeline: 704-211-9541 or TTY: 973-399-3052 TTY 854 105 0345) to talk to a trained counselor call 1-800-273-TALK (toll free, 24 hour hotline) go to [facility] [address], [place] [phone]) call the [facility] Crisis Line: [phone] call 911   Patient verbalizes understanding of instructions and care plan provided today and agrees to view in MyChart. Active MyChart status and patient understanding of how to access  instructions and care plan via MyChart confirmed with patient.     Embry Huss Mckinney Hogan Hoobler, RN, BSN, Media planner  Transitions of Care  [facility] - [facility] [phone]: direct office

## 2023-12-24 ENCOUNTER — Telehealth: Payer: Self-pay

## 2023-12-24 ENCOUNTER — Telehealth: Payer: Self-pay | Admitting: *Deleted

## 2023-12-24 NOTE — Telephone Encounter (Signed)
 Copied from CRM [id number]. Topic: Clinical - Home Health Verbal Orders >> Dec 20, 2023  4:53 PM Kevelyn M wrote: Caller/Agency: [facility] Callback Number: [phone] Service Requested: Physical Therapy Frequency: once a week for 3 more weeks effective 12/31/2023 Any new concerns about the patient? Yes, patient has not met his goal and needs to be extended

## 2023-12-24 NOTE — Telephone Encounter (Signed)
Called and gave ok for verbal orders

## 2023-12-24 NOTE — Telephone Encounter (Signed)
 Ok for [employer]

## 2023-12-24 NOTE — Progress Notes (Signed)
 Complex Care Management Note  Care Guide Note 12/24/2023 Name: AMARI ZAGAL MRN: [id number] DOB: [birth year]  Larnell DELENA Joa is a 83 y.o. year old male who sees [name], MD for primary care. I reached out to [name] by phone today to offer complex care management services.  Mr. Trautman was given information about Complex Care Management services today including:   The Complex Care Management services include support from the care team which includes your Nurse Care Manager, Clinical Social Worker, or Pharmacist.  The Complex Care Management team is here to help remove barriers to the health concerns and goals most important to you. Complex Care Management services are voluntary, and the patient may decline or stop services at any time by request to their care team member.   Complex Care Management Consent Status: Patient agreed to services and verbal consent obtained.   Follow up plan:  Telephone appointment with complex care management team member scheduled for:  12/28/2023  Encounter Outcome:  Patient Scheduled  Thedford Franks, CMA [place]  [facility], [facility] Guide Direct Dial: [phone]  Fax: [phone] Website: [email or web address]

## 2023-12-25 DIAGNOSIS — I480 Paroxysmal atrial fibrillation: Secondary | ICD-10-CM | POA: Diagnosis not present

## 2023-12-25 DIAGNOSIS — I493 Ventricular premature depolarization: Secondary | ICD-10-CM | POA: Diagnosis not present

## 2023-12-25 DIAGNOSIS — I5042 Chronic combined systolic (congestive) and diastolic (congestive) heart failure: Secondary | ICD-10-CM | POA: Diagnosis not present

## 2023-12-25 DIAGNOSIS — I110 Hypertensive heart disease with heart failure: Secondary | ICD-10-CM | POA: Diagnosis not present

## 2023-12-25 DIAGNOSIS — M80052D Age-related osteoporosis with current pathological fracture, left femur, subsequent encounter for fracture with routine healing: Secondary | ICD-10-CM | POA: Diagnosis not present

## 2023-12-28 ENCOUNTER — Other Ambulatory Visit: Payer: Self-pay
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
## 2023-12-28 NOTE — Patient Instructions (Signed)
 Visit Information  Thank you for taking time to visit with me today. Please don't hesitate to contact me if I can be of assistance to you before our next scheduled appointment.  Our next appointment is by telephone on 01/10/24 at 1:30 pm Please call the care guide team at [phone] if you need to cancel or reschedule your appointment.   Following is a copy of your care plan:   Goals Addressed             This Visit's Progress    VBCI RN Care Plan       Problems:  Chronic Disease Management support and education needs related to CHF  Goal: Over the next 90 days the Patient will attend all scheduled medical appointments: with providers as evidenced by patient report and/or review of chart        continue to work with RN Care Manager and/or Social Worker to address care management and care coordination needs related to CHF as evidenced by adherence to care management team scheduled appointments     verbalize understanding of plan for management of CHF as evidenced by attending provider visits, taking medications as prescribed, patient verbalization of understanding  Interventions:  Heart Failure Interventions: Assessed need for readable accurate scales in home Reviewed role of diuretics in prevention of fluid overload and management of heart failure; Discussed the importance of keeping all appointments with provider Advised patient to discuss blood pressure readings with provider Screening for signs and symptoms of depression related to chronic disease state  Assessed social determinant of health barriers  Advised patient to schedule an appointment with cardiologist and assisted patient to transfer call to cardiology office to schedule a follow up/post hospital call(patient preference to transfer call).   Patient Self-Care Activities:  Attend all scheduled provider appointments Call provider office for new concerns or questions  Take medications as prescribed   call office if I  gain more than 2 pounds in one day or 5 pounds in one week keep legs up while sitting track weight in diary watch for swelling in feet, ankles and legs every day weigh myself daily  Plan:  Telephone follow up appointment with care management team member scheduled for:  01/10/24 at 1:30 pm      Please call the Suicide and Crisis Lifeline: 988 call the [place]  National Suicide Prevention Lifeline: 667 397 1640 or TTY: 220-437-0414 TTY (616)258-8035) to talk to a trained counselor if you are experiencing a Mental Health or Behavioral Health Crisis or need someone to talk to.  Patient verbalizes understanding of instructions and care plan provided today and agrees to view in MyChart. Active MyChart status and patient understanding of how to access instructions and care plan via MyChart confirmed with patient.     Heddy Shutter, RN, MSN, BSN, CCM [place]  [facility], Population Health Case Manager Phone: [phone]

## 2023-12-31 ENCOUNTER — Ambulatory Visit

## 2023-12-31 ENCOUNTER — Ambulatory Visit: Payer: Self-pay | Admitting: Cardiology

## 2023-12-31 DIAGNOSIS — I428 Other cardiomyopathies: Secondary | ICD-10-CM | POA: Diagnosis not present

## 2023-12-31 LAB — CUP PACEART REMOTE DEVICE CHECK
Date Time Interrogation Session: 20250714003527
Implantable Pulse Generator Implant Date: 20240702

## 2024-01-04 DIAGNOSIS — I110 Hypertensive heart disease with heart failure: Secondary | ICD-10-CM | POA: Diagnosis not present

## 2024-01-04 DIAGNOSIS — M80052D Age-related osteoporosis with current pathological fracture, left femur, subsequent encounter for fracture with routine healing: Secondary | ICD-10-CM | POA: Diagnosis not present

## 2024-01-04 DIAGNOSIS — I480 Paroxysmal atrial fibrillation: Secondary | ICD-10-CM | POA: Diagnosis not present

## 2024-01-04 DIAGNOSIS — I493 Ventricular premature depolarization: Secondary | ICD-10-CM | POA: Diagnosis not present

## 2024-01-04 DIAGNOSIS — I5042 Chronic combined systolic (congestive) and diastolic (congestive) heart failure: Secondary | ICD-10-CM | POA: Diagnosis not present

## 2024-01-07 ENCOUNTER — Encounter

## 2024-01-10 ENCOUNTER — Other Ambulatory Visit: Payer: Self-pay
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
## 2024-01-10 NOTE — Patient Instructions (Signed)
 Visit Information  Thank you for taking time to visit with me today. Please don't hesitate to contact me if I can be of assistance to you before our next scheduled appointment.  Your next care management appointment is by telephone on 02/05/24 at 1:30 pm   Please call the care guide team at [phone] if you need to cancel, schedule, or reschedule an appointment.   Please call the Suicide and Crisis Lifeline: 988 call the [place]  National Suicide Prevention Lifeline: 4780171443 or TTY: 478-223-6104 TTY [phone]) to talk to a trained counselor if you are experiencing a Mental Health or Behavioral Health Crisis or need someone to talk to.  Heddy Shutter, RN, MSN, BSN, CCM [place]  [facility], Population Health Case Manager Phone: [phone]

## 2024-01-11 DIAGNOSIS — I493 Ventricular premature depolarization: Secondary | ICD-10-CM | POA: Diagnosis not present

## 2024-01-11 DIAGNOSIS — I480 Paroxysmal atrial fibrillation: Secondary | ICD-10-CM | POA: Diagnosis not present

## 2024-01-11 DIAGNOSIS — M80052D Age-related osteoporosis with current pathological fracture, left femur, subsequent encounter for fracture with routine healing: Secondary | ICD-10-CM | POA: Diagnosis not present

## 2024-01-11 DIAGNOSIS — I110 Hypertensive heart disease with heart failure: Secondary | ICD-10-CM | POA: Diagnosis not present

## 2024-01-11 DIAGNOSIS — S72042D Displaced fracture of base of neck of left femur, subsequent encounter for closed fracture with routine healing: Secondary | ICD-10-CM | POA: Diagnosis not present

## 2024-01-11 DIAGNOSIS — I5042 Chronic combined systolic (congestive) and diastolic (congestive) heart failure: Secondary | ICD-10-CM | POA: Diagnosis not present

## 2024-01-15 DIAGNOSIS — I480 Paroxysmal atrial fibrillation: Secondary | ICD-10-CM | POA: Diagnosis not present

## 2024-01-15 DIAGNOSIS — I5042 Chronic combined systolic (congestive) and diastolic (congestive) heart failure: Secondary | ICD-10-CM | POA: Diagnosis not present

## 2024-01-15 DIAGNOSIS — I493 Ventricular premature depolarization: Secondary | ICD-10-CM | POA: Diagnosis not present

## 2024-01-15 DIAGNOSIS — I110 Hypertensive heart disease with heart failure: Secondary | ICD-10-CM | POA: Diagnosis not present

## 2024-01-15 DIAGNOSIS — M80052D Age-related osteoporosis with current pathological fracture, left femur, subsequent encounter for fracture with routine healing: Secondary | ICD-10-CM | POA: Diagnosis not present

## 2024-01-20 DIAGNOSIS — R531 Weakness: Secondary | ICD-10-CM | POA: Diagnosis not present

## 2024-01-20 DIAGNOSIS — S72142A Displaced intertrochanteric fracture of left femur, initial encounter for closed fracture: Secondary | ICD-10-CM | POA: Diagnosis not present

## 2024-01-22 NOTE — Progress Notes (Signed)
 [facility] Summary Report / Loop Recorder

## 2024-01-29 ENCOUNTER — Encounter (HOSPITAL_BASED_OUTPATIENT_CLINIC_OR_DEPARTMENT_OTHER): Payer: Self-pay | Admitting: Cardiology

## 2024-01-29 ENCOUNTER — Ambulatory Visit (HOSPITAL_BASED_OUTPATIENT_CLINIC_OR_DEPARTMENT_OTHER): Admitting: Cardiology

## 2024-01-29 VITALS — BP 100/72 | HR 89 | Ht 69.0 in | Wt 165.5 lb

## 2024-01-29 DIAGNOSIS — I5032 Chronic diastolic (congestive) heart failure: Secondary | ICD-10-CM

## 2024-01-29 DIAGNOSIS — I480 Paroxysmal atrial fibrillation: Secondary | ICD-10-CM | POA: Diagnosis not present

## 2024-01-29 DIAGNOSIS — I428 Other cardiomyopathies: Secondary | ICD-10-CM

## 2024-01-29 DIAGNOSIS — Z87898 Personal history of other specified conditions: Secondary | ICD-10-CM | POA: Diagnosis not present

## 2024-01-29 DIAGNOSIS — I77810 Thoracic aortic ectasia: Secondary | ICD-10-CM | POA: Diagnosis not present

## 2024-01-29 DIAGNOSIS — I2510 Atherosclerotic heart disease of native coronary artery without angina pectoris: Secondary | ICD-10-CM | POA: Diagnosis not present

## 2024-01-29 NOTE — Patient Instructions (Signed)
 Medication Instructions:   Your physician recommends that you continue on your current medications as directed. Please refer to the Current Medication list given to you today.   *If you need a refill on your cardiac medications before your next appointment, please call your pharmacy*  Lab Work:  None ordered.  If you have labs (blood work) drawn today and your tests are completely normal, you will receive your results only by: MyChart Message (if you have MyChart) OR A paper copy in the mail If you have any lab test that is abnormal or we need to change your treatment, we will call you to review the results.  Testing/Procedures:  None ordered.  Follow-Up: At [facility], you and your health needs are our priority.  As part of our continuing mission to provide you with exceptional heart care, our providers are all part of one team.  This team includes your primary Cardiologist (physician) and Advanced Practice Providers or APPs (Physician Assistants and Nurse Practitioners) who all work together to provide you with the care you need, when you need it.  Your next appointment:   5 month(s)  Provider:   [name], MD    We recommend signing up for the patient portal called MyChart.  Sign up information is provided on this After Visit Summary.  MyChart is used to connect with patients for Virtual Visits (Telemedicine).  Patients are able to view lab/test results, encounter notes, upcoming appointments, etc.  Non-urgent messages can be sent to your provider as well.   To learn more about what you can do with MyChart, go to [email or web address].
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
## 2024-01-31 ENCOUNTER — Ambulatory Visit: Payer: Self-pay | Admitting: Cardiology

## 2024-01-31 ENCOUNTER — Ambulatory Visit (INDEPENDENT_AMBULATORY_CARE_PROVIDER_SITE_OTHER)

## 2024-01-31 DIAGNOSIS — I428 Other cardiomyopathies: Secondary | ICD-10-CM | POA: Diagnosis not present

## 2024-01-31 LAB — CUP PACEART REMOTE DEVICE CHECK
Date Time Interrogation Session: 20250813234001
Implantable Pulse Generator Implant Date: 20240702

## 2024-02-05 ENCOUNTER — Other Ambulatory Visit: Payer: Self-pay
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
## 2024-02-05 NOTE — Patient Instructions (Signed)
 Visit Information  Thank you for taking time to visit with me today. Please don't hesitate to contact me if I can be of assistance to you before our next scheduled appointment.  Your next care management appointment is by telephone on 03/07/24 at 1:30 pm   Please call the care guide team at [phone] if you need to cancel, schedule, or reschedule an appointment.   Please call the Suicide and Crisis Lifeline: 988 call the [place]  National Suicide Prevention Lifeline: 970-015-3745 or TTY: 661-025-7096 TTY 410-169-8876) to talk to a trained counselor call 1-800-273-TALK (toll free, 24 hour hotline) if you are experiencing a Mental Health or Behavioral Health Crisis or need someone to talk to.  Heddy Shutter, RN, MSN, BSN, CCM [place]  [facility], Population Health Case Manager Phone: [phone]

## 2024-02-11 ENCOUNTER — Encounter

## 2024-02-20 DIAGNOSIS — S72142A Displaced intertrochanteric fracture of left femur, initial encounter for closed fracture: Secondary | ICD-10-CM | POA: Diagnosis not present

## 2024-02-20 DIAGNOSIS — R531 Weakness: Secondary | ICD-10-CM | POA: Diagnosis not present

## 2024-02-27 DIAGNOSIS — X32XXXD Exposure to sunlight, subsequent encounter: Secondary | ICD-10-CM | POA: Diagnosis not present

## 2024-02-27 DIAGNOSIS — D485 Neoplasm of uncertain behavior of skin: Secondary | ICD-10-CM | POA: Diagnosis not present

## 2024-02-27 DIAGNOSIS — L570 Actinic keratosis: Secondary | ICD-10-CM | POA: Diagnosis not present

## 2024-02-27 DIAGNOSIS — L738 Other specified follicular disorders: Secondary | ICD-10-CM | POA: Diagnosis not present

## 2024-03-03 ENCOUNTER — Ambulatory Visit (INDEPENDENT_AMBULATORY_CARE_PROVIDER_SITE_OTHER)

## 2024-03-03 DIAGNOSIS — I428 Other cardiomyopathies: Secondary | ICD-10-CM

## 2024-03-04 LAB — CUP PACEART REMOTE DEVICE CHECK
Date Time Interrogation Session: 20250914235312
Implantable Pulse Generator Implant Date: 20240702

## 2024-03-06 ENCOUNTER — Ambulatory Visit: Payer: Self-pay | Admitting: Cardiology

## 2024-03-06 ENCOUNTER — Ambulatory Visit: Admitting: Internal Medicine

## 2024-03-07 ENCOUNTER — Telehealth: Payer: Self-pay

## 2024-03-07 NOTE — Patient Outreach (Signed)
 Complex Care Management   Visit Note  03/07/2024  Name:  DIMA FERRUFINO MRN: [id number] DOB: 04-07-1941  Situation: RNCM called patient for a scheduled follow up call. Mr. Upchurch reports this is not a good time to talk and request to reschedule.   Follow Up Plan:   Telephone follow up appointment date/time:  03/11/24 at 9:30 am  Heddy Shutter, RN, MSN, BSN, CCM [place]  [facility], Population Health Case Manager Phone: [phone]

## 2024-03-07 NOTE — Patient Instructions (Signed)
 Visit Information  Thank you for taking time to visit with me today. Please don't hesitate to contact me if I can be of assistance to you before our next scheduled appointment.  Your next care management appointment is by telephone on 03/11/24 at 9:30 am  Please call the care guide team at [phone] if you need to cancel, schedule, or reschedule an appointment.   Please call the Suicide and Crisis Lifeline: 988 call the [place]  National Suicide Prevention Lifeline: (619)212-7536 or TTY: (862)170-9648 TTY 856 038 9278) to talk to a trained counselor call 1-800-273-TALK (toll free, 24 hour hotline) if you are experiencing a Mental Health or Behavioral Health Crisis or need someone to talk to.  Heddy Shutter, RN, MSN, BSN, CCM [place]  [facility], Population Health Case Manager Phone: [phone]

## 2024-03-10 NOTE — Progress Notes (Signed)
 Remote Loop Recorder Transmission

## 2024-03-11 ENCOUNTER — Other Ambulatory Visit: Payer: Self-pay

## 2024-03-11 NOTE — Patient Instructions (Signed)
 Visit Information  Thank you for taking time to visit with me today. Please don't hesitate to contact me if I can be of assistance to you before our next scheduled appointment.  Patient has met all care management goals. Care Management case will be closed. Patient has been provided contact information should new needs arise.   Please call the care guide team at [phone] if you need to cancel, schedule, or reschedule an appointment.   Please call the Suicide and Crisis Lifeline: 988 call the [place] National Suicide Prevention Lifeline: 212-327-3114 or TTY: 513-009-9466 TTY (912)234-5058) to talk to a trained counselor if you are experiencing a Mental Health or Behavioral Health Crisis or need someone to talk to.  Kathyrn Sheriff, RN, MSN, BSN, CCM [place]  [facility], Population Health Case Manager Phone: [phone]
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
## 2024-03-13 NOTE — Progress Notes (Signed)
 Remote Loop Recorder Transmission

## 2024-03-14 ENCOUNTER — Encounter: Payer: Self-pay | Admitting: Podiatry

## 2024-03-14 ENCOUNTER — Ambulatory Visit: Admitting: Podiatry

## 2024-03-14 DIAGNOSIS — B351 Tinea unguium: Secondary | ICD-10-CM | POA: Diagnosis not present

## 2024-03-14 DIAGNOSIS — M79675 Pain in left toe(s): Secondary | ICD-10-CM

## 2024-03-14 DIAGNOSIS — M79674 Pain in right toe(s): Secondary | ICD-10-CM

## 2024-03-14 NOTE — Progress Notes (Signed)
This patient returns to the office for evaluation and treatment of long thick painful nails .  This patient is unable to trim his own nails since the patient cannot reach his feet.  Patient says the nails are painful walking and wearing his shoes.  He returns for preventive foot care services.  General Appearance  Alert, conversant and in no acute stress.  Vascular  Dorsalis pedis and posterior tibial  pulses are palpable  bilaterally.  Capillary return is within normal limits  bilaterally. Temperature is within normal limits  bilaterally.  Neurologic  Senn-Weinstein monofilament wire test within normal limits  bilaterally. Muscle power within normal limits bilaterally.  Nails Thick disfigured discolored nails with subungual debris  from hallux to fifth toes bilaterally. No evidence of bacterial infection or drainage bilaterally.  Orthopedic  No limitations of motion  feet .  No crepitus or effusions noted.  No bony pathology or digital deformities noted.  Skin  normotropic skin with no porokeratosis noted bilaterally.  No signs of infections or ulcers noted.     Onychomycosis  Pain in toes right foot  Pain in toes left foot  Debridement  of nails  1-5  B/L with a nail nipper.  Nails were then filed using a dremel tool with no incidents.    RTC  12 weeks    Leilyn Frayre DPM  

## 2024-03-17 ENCOUNTER — Encounter

## 2024-03-21 DIAGNOSIS — R531 Weakness: Secondary | ICD-10-CM | POA: Diagnosis not present

## 2024-03-21 DIAGNOSIS — S72142A Displaced intertrochanteric fracture of left femur, initial encounter for closed fracture: Secondary | ICD-10-CM | POA: Diagnosis not present

## 2024-03-26 NOTE — Progress Notes (Signed)
 Remote Loop Recorder Transmission

## 2024-04-02 ENCOUNTER — Ambulatory Visit (INDEPENDENT_AMBULATORY_CARE_PROVIDER_SITE_OTHER)

## 2024-04-02 DIAGNOSIS — I428 Other cardiomyopathies: Secondary | ICD-10-CM | POA: Diagnosis not present

## 2024-04-03 ENCOUNTER — Encounter

## 2024-04-03 ENCOUNTER — Ambulatory Visit: Payer: Self-pay | Admitting: Cardiology

## 2024-04-03 LAB — CUP PACEART REMOTE DEVICE CHECK
Date Time Interrogation Session: 20251014232315
Implantable Pulse Generator Implant Date: 20240702

## 2024-04-07 NOTE — Progress Notes (Signed)
 Remote Loop Recorder Transmission

## 2024-04-15 DIAGNOSIS — Z85828 Personal history of other malignant neoplasm of skin: Secondary | ICD-10-CM | POA: Diagnosis not present

## 2024-04-15 DIAGNOSIS — L570 Actinic keratosis: Secondary | ICD-10-CM | POA: Diagnosis not present

## 2024-04-15 DIAGNOSIS — Z08 Encounter for follow-up examination after completed treatment for malignant neoplasm: Secondary | ICD-10-CM | POA: Diagnosis not present

## 2024-04-15 DIAGNOSIS — X32XXXD Exposure to sunlight, subsequent encounter: Secondary | ICD-10-CM | POA: Diagnosis not present

## 2024-04-18 ENCOUNTER — Other Ambulatory Visit: Payer: Self-pay | Admitting: Pharmacist

## 2024-04-18 DIAGNOSIS — E7800 Pure hypercholesterolemia, unspecified: Secondary | ICD-10-CM

## 2024-04-18 MED ORDER — ATORVASTATIN CALCIUM 20 MG PO TABS: 20.0000 mg | ORAL_TABLET | Freq: Every day | ORAL | 0 refills | Status: DC

## 2024-05-03 ENCOUNTER — Encounter

## 2024-05-04 ENCOUNTER — Ambulatory Visit

## 2024-05-05 ENCOUNTER — Encounter

## 2024-05-07 ENCOUNTER — Ambulatory Visit: Attending: Cardiology

## 2024-05-07 DIAGNOSIS — I428 Other cardiomyopathies: Secondary | ICD-10-CM

## 2024-05-07 LAB — CUP PACEART REMOTE DEVICE CHECK
Date Time Interrogation Session: 20251118235742
Implantable Pulse Generator Implant Date: 20240702

## 2024-05-08 ENCOUNTER — Ambulatory Visit: Payer: Self-pay | Admitting: Cardiology

## 2024-05-09 NOTE — Progress Notes (Signed)
 Remote Loop Recorder Transmission

## 2024-06-02 ENCOUNTER — Encounter: Payer: Self-pay | Admitting: Podiatry

## 2024-06-02 ENCOUNTER — Ambulatory Visit: Admitting: Podiatry

## 2024-06-02 DIAGNOSIS — M79674 Pain in right toe(s): Secondary | ICD-10-CM

## 2024-06-02 DIAGNOSIS — B351 Tinea unguium: Secondary | ICD-10-CM

## 2024-06-02 DIAGNOSIS — M79675 Pain in left toe(s): Secondary | ICD-10-CM

## 2024-06-02 NOTE — Progress Notes (Signed)
This patient returns to the office for evaluation and treatment of long thick painful nails .  This patient is unable to trim his own nails since the patient cannot reach his feet.  Patient says the nails are painful walking and wearing his shoes.  He returns for preventive foot care services.  General Appearance  Alert, conversant and in no acute stress.  Vascular  Dorsalis pedis and posterior tibial  pulses are palpable  bilaterally.  Capillary return is within normal limits  bilaterally. Temperature is within normal limits  bilaterally.  Neurologic  Senn-Weinstein monofilament wire test within normal limits  bilaterally. Muscle power within normal limits bilaterally.  Nails Thick disfigured discolored nails with subungual debris  from hallux to fifth toes bilaterally. No evidence of bacterial infection or drainage bilaterally.  Orthopedic  No limitations of motion  feet .  No crepitus or effusions noted.  No bony pathology or digital deformities noted.  Skin  normotropic skin with no porokeratosis noted bilaterally.  No signs of infections or ulcers noted.     Onychomycosis  Pain in toes right foot  Pain in toes left foot  Debridement  of nails  1-5  B/L with a nail nipper.  Nails were then filed using a dremel tool with no incidents.    RTC  12 weeks    Leilyn Frayre DPM  

## 2024-06-03 ENCOUNTER — Encounter

## 2024-06-04 ENCOUNTER — Ambulatory Visit

## 2024-06-05 ENCOUNTER — Encounter

## 2024-06-07 ENCOUNTER — Ambulatory Visit

## 2024-06-07 DIAGNOSIS — I428 Other cardiomyopathies: Secondary | ICD-10-CM | POA: Diagnosis not present

## 2024-06-08 LAB — CUP PACEART REMOTE DEVICE CHECK
Date Time Interrogation Session: 20251219233559
Implantable Pulse Generator Implant Date: 20240702

## 2024-06-09 NOTE — Progress Notes (Signed)
 Remote Loop Recorder Transmission

## 2024-06-18 ENCOUNTER — Other Ambulatory Visit: Payer: Self-pay

## 2024-06-18 ENCOUNTER — Emergency Department (HOSPITAL_COMMUNITY)

## 2024-06-18 ENCOUNTER — Emergency Department (HOSPITAL_COMMUNITY)
Admission: EM | Admit: 2024-06-18 | Discharge: 2024-06-18 | Disposition: A | Discharge status: Home / Self Care | Attending: Emergency Medicine | Admitting: Emergency Medicine

## 2024-06-18 ENCOUNTER — Ambulatory Visit: Payer: Self-pay | Admitting: Cardiovascular Disease

## 2024-06-18 DIAGNOSIS — W01198A Fall on same level from slipping, tripping and stumbling with subsequent striking against other object, initial encounter: Secondary | ICD-10-CM | POA: Diagnosis not present

## 2024-06-18 DIAGNOSIS — I480 Paroxysmal atrial fibrillation: Secondary | ICD-10-CM | POA: Insufficient documentation

## 2024-06-18 DIAGNOSIS — S060X0A Concussion without loss of consciousness, initial encounter: Secondary | ICD-10-CM | POA: Insufficient documentation

## 2024-06-18 DIAGNOSIS — I509 Heart failure, unspecified: Secondary | ICD-10-CM | POA: Insufficient documentation

## 2024-06-18 DIAGNOSIS — S0081XA Abrasion of other part of head, initial encounter: Secondary | ICD-10-CM | POA: Insufficient documentation

## 2024-06-18 DIAGNOSIS — S51012A Laceration without foreign body of left elbow, initial encounter: Secondary | ICD-10-CM | POA: Diagnosis not present

## 2024-06-18 DIAGNOSIS — S61412A Laceration without foreign body of left hand, initial encounter: Secondary | ICD-10-CM | POA: Insufficient documentation

## 2024-06-18 DIAGNOSIS — Y9301 Activity, walking, marching and hiking: Secondary | ICD-10-CM | POA: Diagnosis not present

## 2024-06-18 DIAGNOSIS — Z7901 Long term (current) use of anticoagulants: Secondary | ICD-10-CM | POA: Insufficient documentation

## 2024-06-18 DIAGNOSIS — Y92810 Car as the place of occurrence of the external cause: Secondary | ICD-10-CM | POA: Diagnosis not present

## 2024-06-18 DIAGNOSIS — S0990XA Unspecified injury of head, initial encounter: Secondary | ICD-10-CM | POA: Diagnosis present

## 2024-06-18 DIAGNOSIS — I2510 Atherosclerotic heart disease of native coronary artery without angina pectoris: Secondary | ICD-10-CM | POA: Insufficient documentation

## 2024-06-18 DIAGNOSIS — W19XXXA Unspecified fall, initial encounter: Secondary | ICD-10-CM

## 2024-06-18 DIAGNOSIS — M25552 Pain in left hip: Secondary | ICD-10-CM | POA: Insufficient documentation

## 2024-06-18 LAB — CBC WITH DIFFERENTIAL/PLATELET
Abs Immature Granulocytes: 0.07 K/uL (ref 0.00–0.07)
Basophils Absolute: 0.1 K/uL (ref 0.0–0.1)
Basophils Relative: 1 %
Eosinophils Absolute: 0.2 K/uL (ref 0.0–0.5)
Eosinophils Relative: 3 %
HCT: 52.4 % — ABNORMAL HIGH (ref 39.0–52.0)
Hemoglobin: 17.8 g/dL — ABNORMAL HIGH (ref 13.0–17.0)
Immature Granulocytes: 1 %
Lymphocytes Relative: 37 %
Lymphs Abs: 2.9 K/uL (ref 0.7–4.0)
MCH: 31 pg (ref 26.0–34.0)
MCHC: 34 g/dL (ref 30.0–36.0)
MCV: 91.3 fL (ref 80.0–100.0)
Monocytes Absolute: 0.7 K/uL (ref 0.1–1.0)
Monocytes Relative: 9 %
Neutro Abs: 4 K/uL (ref 1.7–7.7)
Neutrophils Relative %: 49 %
Platelets: 209 K/uL (ref 150–400)
RBC: 5.74 MIL/uL (ref 4.22–5.81)
RDW: 13.6 % (ref 11.5–15.5)
WBC: 8 K/uL (ref 4.0–10.5)
nRBC: 0 % (ref 0.0–0.2)

## 2024-06-18 LAB — BASIC METABOLIC PANEL WITH GFR
Anion gap: 16 — ABNORMAL HIGH (ref 5–15)
BUN: 16 mg/dL (ref 8–23)
CO2: 26 mmol/L (ref 22–32)
Calcium: 10.1 mg/dL (ref 8.9–10.3)
Chloride: 99 mmol/L (ref 98–111)
Creatinine, Ser: 1.15 mg/dL (ref 0.61–1.24)
GFR, Estimated: >60 mL/min
Glucose, Bld: 113 mg/dL — ABNORMAL HIGH (ref 70–99)
Potassium: 4 mmol/L (ref 3.5–5.1)
Sodium: 141 mmol/L (ref 135–145)

## 2024-06-18 LAB — I-STAT CG4 LACTIC ACID, ED
Lactic Acid, Venous: 3.9 mmol/L (ref 0.5–1.9)
Lactic Acid, Venous: 2.6 mmol/L (ref 0.5–1.9)

## 2024-06-18 MED ORDER — OXYCODONE-ACETAMINOPHEN 5-325 MG PO TABS
1.0000 | ORAL_TABLET | Freq: Once | ORAL | Status: AC
  Administered 2024-06-18: 1 via ORAL
  Filled 2024-06-18: qty 1

## 2024-06-18 MED ORDER — LACTATED RINGERS IV BOLUS
1000.0000 mL | Freq: Once | INTRAVENOUS | Status: AC
  Administered 2024-06-18: 1000 mL via INTRAVENOUS

## 2024-06-18 MED ORDER — ONDANSETRON HCL 4 MG/2ML IJ SOLN
4.0000 mg | Freq: Once | INTRAMUSCULAR | Status: AC
  Administered 2024-06-18: 4 mg via INTRAVENOUS
  Filled 2024-06-18: qty 2

## 2024-06-18 MED ORDER — LACTATED RINGERS IV BOLUS
500.0000 mL | Freq: Once | INTRAVENOUS | Status: AC
  Administered 2024-06-18: 500 mL via INTRAVENOUS

## 2024-06-18 NOTE — ED Notes (Signed)
Pt ambulated in hall with no issues.
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
## 2024-06-18 NOTE — ED Provider Notes (Signed)
 " [place] EMERGENCY DEPARTMENT AT [facility] Provider Note   CSN: [id number] Arrival date & time: 06/18/24  1743     Patient presents with: Gary Atkins is a 83 y.o. male with a past medical history of HLD, CAD, nonischemic cardiomyopathy, seizure, PAF (on Eliquis ), heart failure, CAD, diverticulosis presents to emergency department via EMS for evaluation of head injury following fall today.  Reports that he was walking to his car when he tripped over a curb and fell forward hitting his head on the concrete.  Patient recalls entire event.  No LOC.  No complaints prior to fall.  Complains of laceration to left hand, left elbow, multiple abrasions to left side of forehead. Last tdap 2023    Fall       Prior to Admission medications  Medication Sig Start Date End Date Taking? Authorizing Provider  apixaban  (ELIQUIS ) 5 MG TABS tablet Take 1 tablet by mouth twice daily 09/18/23   Waddell Danelle ORN, MD  atorvastatin  (LIPITOR) 20 MG tablet Take 1 tablet (20 mg total) by mouth daily. 04/18/24   [name], MD  Cyanocobalamin  (B-12 PO) Take 2 tablets by mouth daily.    [provider]  divalproex  (DEPAKOTE  ER) 500 MG 24 hr tablet Take 1 tablet by mouth twice daily 04/16/23   [name], MD  feeding supplement (ENSURE PLUS HIGH PROTEIN) LIQD Take 237 mLs by mouth 2 (two) times daily between meals. 11/17/23   Shalhoub, Zachary PARAS, MD  ferrous sulfate  325 (65 FE) MG tablet Take 1 tablet (325 mg total) by mouth 3 (three) times daily after meals. 10/30/23 03/14/24  Leotis Bogus, MD  furosemide  (LASIX ) 40 MG tablet Take 1 tablet (40 mg total) by mouth 3 times/day as needed-between meals & bedtime (only take if your weight increases by more than 2 pounds in one day or 5 pounds in one week). 11/17/23   Shalhoub, Zachary PARAS, MD  latanoprost  (XALATAN ) 0.005 % ophthalmic solution Place 1 drop into both eyes at bedtime. 04/07/20   [provider]  Multiple Vitamin  (MULTIVITAMIN WITH MINERALS) TABS tablet Take 1 tablet by mouth daily. 11/18/23   Shalhoub, Zachary PARAS, MD  Multiple Vitamins-Minerals (PRESERVISION AREDS PO) Take 1 tablet by mouth daily.    [provider]  NON FORMULARY Apply 1 application  topically daily as needed (nail fungus). Antifungal toenail solution from [place], faxed on 01/14/2019    [provider]  senna (SENOKOT) 8.6 MG TABS tablet Take 1 tablet (8.6 mg total) by mouth 2 (two) times daily. 10/30/23   Leotis Bogus, MD    Allergies: Demerol [meperidine] and Tramadol     Review of Systems  Musculoskeletal:  Negative for back pain and neck pain.    Updated Vital Signs BP 124/88   Pulse 68   Temp 98.1 F (36.7 C) (Oral)   Resp 18   SpO2 100%   Physical Exam Vitals and nursing note reviewed.  Constitutional:      General: He is not in acute distress.    Appearance: Normal appearance. He is not ill-appearing or diaphoretic.  HENT:     Head: Normocephalic. No raccoon eyes or Battle's sign.     Comments: Abrasion to right forehead. No active hemorrhage nor signs of infection. No crepitus to facial bones    Right Ear: External ear normal. No hemotympanum.     Left Ear: External ear normal. No hemotympanum.     Nose: Nose  normal.     Right Nostril: No epistaxis or septal hematoma.     Left Nostril: No epistaxis or septal hematoma.     Mouth/Throat:     Mouth: Mucous membranes are moist. No injury or lacerations.  Eyes:     General: Lids are normal. Vision grossly intact. No visual field deficit.       Right eye: No discharge.        Left eye: No discharge.     Extraocular Movements: Extraocular movements intact.     Right eye: Normal extraocular motion and no nystagmus.     Left eye: Normal extraocular motion and no nystagmus.     Conjunctiva/sclera: Conjunctivae normal.     Pupils: Pupils are equal, round, and reactive to light.     Comments: No subconjunctival hemorrhage, hyphema, tear  drop pupil, or fluid leakage bilaterally. No signs of EOM entrapement  Neck:     Vascular: No carotid bruit.  Cardiovascular:     Rate and Rhythm: Normal rate.     Pulses: Normal pulses.          Radial pulses are 2+ on the right side and 2+ on the left side.       Dorsalis pedis pulses are 2+ on the right side and 2+ on the left side.     Heart sounds: Normal heart sounds.  Pulmonary:     Effort: Pulmonary effort is normal. No respiratory distress.     Breath sounds: Normal breath sounds. No wheezing.  Chest:     Chest wall: No tenderness.  Abdominal:     General: Bowel sounds are normal. There is no distension.     Palpations: Abdomen is soft.     Tenderness: There is no abdominal tenderness. There is no guarding or rebound.  Musculoskeletal:     Cervical back: Full passive range of motion without pain, normal range of motion and neck supple. No deformity, rigidity or bony tenderness. Normal range of motion.     Thoracic back: No deformity or bony tenderness. Normal range of motion.     Lumbar back: No deformity or bony tenderness. Normal range of motion.     Right hip: No bony tenderness or crepitus.     Left hip: Bony tenderness present. No crepitus.     Right lower leg: No edema.     Left lower leg: No edema.     Comments: <1cm skin tear to left 5th MCP and left elbow. No active hemorrhage. No signs of infection. ROM of all digits of left hand, wrist, and elbow WNL. No obvious deformity to joints or long bones. Pelvis stable with no shortening or rotation of LE bilaterally  Skin:    General: Skin is warm and dry.     Capillary Refill: Capillary refill takes less than 2 seconds.     Coloration: Skin is not jaundiced or pale.     Comments: No ecchymosis to chest, abdomen, back  Neurological:     General: No focal deficit present.     Mental Status: He is alert and oriented to person, place, and time. Mental status is at baseline.     GCS: GCS eye subscore is 4. GCS verbal  subscore is 5. GCS motor subscore is 6.     Cranial Nerves: Cranial nerves 2-12 are intact. No cranial nerve deficit, dysarthria or facial asymmetry.     Sensory: Sensation is intact. No sensory deficit.     Motor: Motor function is intact.  No weakness, tremor, abnormal muscle tone, seizure activity or pronator drift.     Coordination: Coordination is intact. Coordination normal. Finger-Nose-Finger Test and Heel to [facility] Test normal.     Gait: Gait is intact. Gait normal.     Deep Tendon Reflexes: Reflexes are normal and symmetric. Reflexes normal.     Comments: A&Ox3. following commands appropriately.  Motor 5/5 and sensation 2/2 of BUE and BLE.  No speech nor visual deficits. Ambulates with walker wo difficulty     (all labs ordered are listed, but only abnormal results are displayed) Labs Reviewed  CBC WITH DIFFERENTIAL/PLATELET - Abnormal; Notable for the following components:      Result Value   Hemoglobin 17.8 (*)    HCT 52.4 (*)    All other components within normal limits  BASIC METABOLIC PANEL WITH GFR - Abnormal; Notable for the following components:   Glucose, Bld 113 (*)    Anion gap 16 (*)    All other components within normal limits  I-STAT CG4 LACTIC ACID, ED - Abnormal; Notable for the following components:   Lactic Acid, Venous 3.9 (*)    All other components within normal limits  I-STAT CG4 LACTIC ACID, ED - Abnormal; Notable for the following components:   Lactic Acid, Venous 2.6 (*)    All other components within normal limits    EKG: None  Radiology: DG Elbow Complete Left Result Date: 06/18/2024 EXAM: 3 VIEW(S) XRAY OF THE LEFT ELBOW COMPARISON: None available. CLINICAL HISTORY: fall FINDINGS: BONES AND JOINTS: No acute fracture. No malalignment. SOFT TISSUES: The soft tissues are unremarkable. IMPRESSION: 1. No acute findings. Electronically signed by: Franky Stanford MD 06/18/2024 10:20 PM EST RP Workstation: [id number]   DG Hand Complete Left Result Date:  06/18/2024 EXAM: 3 OR MORE VIEW(S) XRAY OF THE LEFT HAND 06/18/2024 09:36:00 PM COMPARISON: None available. CLINICAL HISTORY: hand pain after fall FINDINGS: BONES AND JOINTS: Joint space narrowing throughout the distal interphalangeal joints. No acute fracture or dislocation. SOFT TISSUES: The soft tissues are unremarkable. IMPRESSION: 1. Joint space narrowing throughout the distal interphalangeal joints. 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 Mild degenerative changes of right hip. No acute fracture or dislocation. No diastasis of the pelvis. SOFT TISSUES: Vascular calcifications. The soft tissues are unremarkable. IMPRESSION: 1. No acute findings. 2. Left hip arthroplasty in place. Electronically signed by: Morgane Naveau MD 06/18/2024 07:38 PM EST RP Workstation: [id number]   CT Cervical Spine Wo Contrast Result Date: 06/18/2024 EXAM: CT CERVICAL SPINE WITHOUT CONTRAST 06/18/2024 06:02:55 PM TECHNIQUE: CT of the cervical spine was performed without the administration of intravenous contrast. Multiplanar reformatted images are provided for review. Automated exposure control, iterative reconstruction, and/or weight based adjustment of the mA/kV was utilized to reduce the radiation dose to as low as reasonably achievable. COMPARISON: None available. CLINICAL HISTORY: Neck trauma (Age >= 65y) FINDINGS: BONES AND ALIGNMENT: No acute fracture or traumatic malalignment. DEGENERATIVE CHANGES: Disc space narrowing at multiple levels in the cervical spine with associated endplate sclerosis and degenerative endplate osteophytes. There are small disc osteophyte complexes at multiple levels without high grade  osseous spinal canal stenosis. Facet arthrosis and uncovertebral hypertrophy throughout the cervical spine. There is at least moderate foraminal stenosis at  multiple levels. SOFT TISSUES: No prevertebral soft tissue swelling. IMPRESSION: 1. No evidence of acute traumatic injury. Electronically signed by: Donnice Mania MD 06/18/2024 06:38 PM EST RP Workstation: [id number]   CT Head Wo Contrast Result Date: 06/18/2024 EXAM: CT HEAD WITHOUT CONTRAST 06/18/2024 06:02:55 PM TECHNIQUE: CT of the head was performed without the administration of intravenous contrast. Automated exposure control, iterative reconstruction, and/or weight based adjustment of the mA/kV was utilized to reduce the radiation dose to as low as reasonably achievable. COMPARISON: 10/27/2023 CLINICAL HISTORY: Head trauma, moderate-severe. FINDINGS: BRAIN AND VENTRICLES: No acute hemorrhage. No evidence of acute infarct. Stable degree of atrophy and chronic small vessel ischemia. Senescent basal ganglia mineralization. Small remote lacunar infarct in the right thalamus. No hydrocephalus. No extra-axial collection. No mass effect or midline shift. ORBITS: Bilateral cataract resection. SINUSES: Mild mucosal thickening throughout the paranasal sinuses. SOFT TISSUES AND SKULL: Moderate left frontal scalp hematoma. Atherosclerosis of skullbase vasculature without hyperdense vessel or abnormal calcification. No skull fracture. IMPRESSION: 1. No acute intracranial abnormality. 2. Moderate left frontal scalp hematoma. Electronically signed by: Donnice Mania MD 06/18/2024 06:33 PM EST RP Workstation: [id number]     Medications Ordered in the ED  oxyCODONE -acetaminophen  (PERCOCET/ROXICET) 5-325 MG per tablet 1 tablet (1 tablet Oral Given 06/18/24 1813)  lactated ringers  bolus 1,000 mL (0 mLs Intravenous Stopped 06/18/24 2048)  lactated ringers  bolus 500 mL (0 mLs Intravenous Stopped 06/18/24 2231)  ondansetron  (ZOFRAN ) injection 4 mg (4 mg Intravenous  Given 06/18/24 2228)    Clinical Course as of 06/18/24 2310  Wed Jun 18, 2024  2111 Lactic Acid, Venous(!!): 2.6 Decreased from 3.9 following IVF. Provided an additional 500cc. no seizure like activity nor post ictal period witnessed. No hx of seizure.  [LB]  2112 Anion gap(!): 16 No leukocytosis, nor signs of sepsis. No hyperglycemia nor signs of DKA [LB]    Clinical Course User Index [LB] Minnie Tinnie BRAVO, PA                                 Medical Decision Making Amount and/or Complexity of Data Reviewed Labs: ordered. Decision-making details documented in ED Course. Radiology: ordered.  Risk Prescription drug management.   Patient presents to the ED for concern of pain following fall, this involves an extensive number of treatment options, and is a complaint that carries with it a high risk of complications and morbidity.  The differential diagnosis includes intracranial bleed, basilar skull fracture, concussion, laceration, dislocation,   Co morbidities that complicate the patient evaluation  On chronic anticoagulation See HPI   Additional history obtained:  Additional history obtained from EMS and Family   External records from outside source obtained and reviewed including triage note, EMS note   Lab Tests:  I Ordered, and personally interpreted labs.  The pertinent results include:   Lactic acid 3.9-2.6 CBG 113 Hgb 17.8 AG 16    Imaging Studies ordered:  I ordered imaging studies including CT head, cervical spine, portable pelvis x-ray, left elbow x-ray, left hand x-ray I independently visualized and interpreted imaging which showed no acute traumatic injury See individual x-rays for specific impressions I agree with the radiologist interpretation     Medicines ordered and prescription drug management:  I ordered medication including percocet  for pain  Reevaluation of the patient after these medicines showed that the patient improved I have  reviewed the patients home medicines and have made adjustments as needed  Problem List / ED Course:  Fall Vital signs hemodynamically stable no tachycardia nor hypotension Is on Eliquis  and last took Eliquis  this morning Last tdap 2023 Neurovascularly intact with no motor or sensory deficits.  No complaints of dizziness or visual disturbances. Denies LOC and recalls entire event Fall sounds mechanical in nature with no complaints prior.  No complaints of chest pain, shortness of breath. No ecchymosis to chest, abdomen, back.  No chest tenderness, abdominal tenderness, tenderness to spine or back.  Low suspicion for intrathoracic traumatic injury, intra-abdominal traumatic injury, retroperitoneal traumatic injury. Provided Percocet for pain Lactic 3.86.  Last echo 11/17/2023 notable for LVEF 55-60%.  Echo in 2023 LVEF 60-65%.  Provided IVF and decreased to 2.6 - provided an additional 500cc Ambulated patient wo complication Prior to DC, patient had one episode of vomiting likely 2/2 concussion. Provided zofran  and pt passed po. Provided concussion clinic f/u Patient is to go home with wife who can keep an eye on patient  Head injury Abrasion to right forehead CT head and cervical spine negative for traumatic injury No laceration nor need for repair Discussed symptomatic treatment for keeping wound clean and dry. Neosporin for topical infection prophylaxis  Left hip pain Portable pelvis x-ray negative No shortening nor rotation of BLE. Able to ambulate wo difficulty and WB equally ROM WNL Neurovascularly intact  Skin tear to left hand  Skin tear to left elbow Small. <1cm. Skin tear and unable to repair XR of left hand and elbow wo fx nor dislocation ROM WNL Neurovascularly intact   Reevaluation:  After the interventions noted above, I reevaluated the patient and found that they have :improved    Dispostion:  After consideration of the diagnostic results and the  patients response to treatment, I feel that the patent would benefit from outpatient management with symptomatic tx.   Discussed ED workup, disposition, return to ED precautions with patient who expresses understanding agrees with plan.  All questions answered to their satisfaction.  They are agreeable to plan.  Discharge instructions provided on paperwork  Final diagnoses:  Fall, initial encounter  Injury of head, initial encounter  Concussion without loss of consciousness, initial encounter    ED Discharge Orders     None        Minnie Tinnie BRAVO, [place] 06/18/24 2311  "

## 2024-06-18 NOTE — Discharge Instructions (Addendum)
 Thank you for letting us  evaluate you today.  Your electrolytes are within normal limits.  Your x-rays of your left hand, left elbow were negative for fracture, dislocation.  Your CT imaging of your head did not show any bleeding.  Your CT imaging of your neck did not show any fracture.  You may have sustained a concussion.  You can follow-up with concussion clinic if noted.  I have also provided some information regarding of concussion.  Return to Emergency Department if you experience altered mentation, confusion, inability to walk, slurred speech, severe debilitating headache, worsening symptoms  Follow up with PCP in 1-2weeks

## 2024-06-18 NOTE — ED Triage Notes (Signed)
 Pt. BIB [facility] from home with c/o FOT; Per [facility] the pt. Was heading to get into his car and he tripped on a curb and hit the concrete; pt. Reports that he remembers falling; No LOC, No dizziness; Pt. Is on eliquis ; Pt. Has back brace due to chronic back pain; Pt. Is A/O x3

## 2024-06-20 ENCOUNTER — Ambulatory Visit (HOSPITAL_BASED_OUTPATIENT_CLINIC_OR_DEPARTMENT_OTHER): Admitting: Cardiology

## 2024-06-20 ENCOUNTER — Encounter (HOSPITAL_BASED_OUTPATIENT_CLINIC_OR_DEPARTMENT_OTHER): Payer: Self-pay | Admitting: Cardiology

## 2024-06-20 VITALS — BP 138/78 | HR 63 | Ht 68.0 in | Wt 166.5 lb

## 2024-06-20 DIAGNOSIS — I5020 Unspecified systolic (congestive) heart failure: Secondary | ICD-10-CM

## 2024-06-20 DIAGNOSIS — I480 Paroxysmal atrial fibrillation: Secondary | ICD-10-CM | POA: Diagnosis not present

## 2024-06-20 DIAGNOSIS — Z87898 Personal history of other specified conditions: Secondary | ICD-10-CM | POA: Diagnosis not present

## 2024-06-20 DIAGNOSIS — I493 Ventricular premature depolarization: Secondary | ICD-10-CM | POA: Diagnosis not present

## 2024-06-20 DIAGNOSIS — I428 Other cardiomyopathies: Secondary | ICD-10-CM | POA: Diagnosis not present

## 2024-06-20 DIAGNOSIS — I2510 Atherosclerotic heart disease of native coronary artery without angina pectoris: Secondary | ICD-10-CM | POA: Diagnosis not present

## 2024-06-20 NOTE — Patient Instructions (Addendum)

## 2024-06-20 NOTE — Progress Notes (Signed)
 " Cardiology Office Note:  .    Date:  06/20/2024  ID:  Gary Atkins, DOB [birth year], MRN [id number] PCP: [name], MD  [facility] Providers Cardiologist:  [name], MD     History of Present Illness: .    Gary Atkins is a 84 y.o. male with a hx of chronic systolic and diastolic heart failure, NICM, nonobstructive CAD, hx of PVCs and AVNRT s/p ablation, paroxysmal atrial fibrillation who is seen for follow up today.     Cardiac history: Chronic systolic and diastolic heart failure, suspected NICM, diagnosed 2011. Workup unrevealing, but EF improved from 35% to 50-55% after PVC ablation. Most recent EF 45-50% in 07/2017. Nonobstructive CAD S/P ablation of PVCs and AVNRT 2011 (Dr. Kelsie) and Dr. Waddell (12/2022) New diagnosis of atrial fibrillation by loop recorder 01/2023  Today: Tripped and fell 12/31, hit his head, came to ER (reviewed). Was imaged, no acute bleeding. Did have elevated lactate, received IV fluids, improved but did not normalize. Denies any fevers/chills.  No loss of consciousness.  On lasix  PRN for weight gain/LE edema. Takes about every third day.   ROS:  Denies shortness of breath at rest. No PND, orthopnea, change in LE edema or unexpected weight gain. No syncope or palpitations. ROS otherwise negative except as noted.   Studies Reviewed: SABRA         Physical Exam:    VS:  BP 138/78 (BP Location: Right Arm, Patient Position: Sitting, Cuff Size: Normal)   Pulse 63   Ht 5' 8 (1.727 m)   Wt 166 lb 8 oz (75.5 kg)   SpO2 95%   BMI 25.32 kg/m    Wt Readings from Last 3 Encounters:  06/20/24 166 lb 8 oz (75.5 kg)  03/11/24 165 lb (74.8 kg)  01/29/24 165 lb 8 oz (75.1 kg)    GEN: Well nourished, well developed in no acute distress HEENT: Ecchymoses across face, head is bandaged NECK: No JVD CARDIAC: regular rhythm with one ectopic bear, normal S1 and S2, no rubs or gallops. No murmur. VASCULAR: Radial and DP pulses 2+ bilaterally.  No carotid bruits RESPIRATORY:  Clear to auscultation without rales, wheezing or rhonchi  ABDOMEN: Soft, non-tender, non-distended MUSCULOSKELETAL:  Ambulates independently SKIN: Warm and dry, trivial to 1+ bilateral LE edema L>R with mild skin discoloration NEUROLOGIC:  Alert and oriented x 3. No focal neuro deficits noted. PSYCHIATRIC:  Normal affect   ASSESSMENT AND PLAN: .    History of syncope pSVT PVCs Paroxysmal atrial fibrillation, secondary hypercoagulable state -new diagnosis of atrial fib by loop recorder 01/2023 -s/p repeat SVT ablation -has ILR, no recent afib based on interrogation -CHA2DS2/VAS Stroke Risk Points=5  -tolerating apixaban  without major bleeding. Did have recent mechanical fall, had head CT that was unremarkable. Discussed that if falls become frequent, we will need to discuss risk of anticoagulation   Bilateral LE edema, most consistent with venous insufficiency -continue 40 mg lasix  as needed   Nonischemic cardiomyopathy, prior EF 45-50%, now 55-60% consistent with HF with recovered ejection fraction -lisinopril  2.5 mg stopped with hospitalization for hypotension/volume depletion 10/2023 -carvedilol  stopped by Dr. Fernande -thought to be NICM 2/2 PVC burden -We discussed red flag warning signs that need immediate medical attention -previously declined SGLT2i -lasix  PRN as above   Nonobstructive CAD: no symptoms -no longer on atorvastatin  per personal preference. We discussed today. 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 Prior measured 40 mm in 2023 echo. Discussed monitoring. He would not want surgery even if it enlarges to 55 mm, declines routine monitoring  CV risk counseling and prevention -recommend heart healthy/Mediterranean diet, with whole grains, fruits, vegetable, fish, lean meats, nuts, and olive oil. Limit salt. -recommend moderate walking, 3-5 times/week for 30-50 minutes each  session. Aim for at least 150 minutes.week. Goal should be pace of 3 miles/hours, or walking 1.5 miles in 30 minutes -recommend avoidance of tobacco products. Avoid excess alcohol.  Dispo: Follow-up in 6 months, or sooner as needed.  Signed, [name], MD   "

## 2024-07-03 ENCOUNTER — Encounter: Payer: Self-pay | Admitting: Internal Medicine

## 2024-07-03 ENCOUNTER — Ambulatory Visit: Admitting: Internal Medicine

## 2024-07-03 VITALS — BP 124/80 | HR 91 | Temp 97.8°F | Ht 68.0 in | Wt 166.0 lb

## 2024-07-03 DIAGNOSIS — Z0001 Encounter for general adult medical examination with abnormal findings: Secondary | ICD-10-CM

## 2024-07-03 DIAGNOSIS — R739 Hyperglycemia, unspecified: Secondary | ICD-10-CM

## 2024-07-03 DIAGNOSIS — H6121 Impacted cerumen, right ear: Secondary | ICD-10-CM | POA: Diagnosis not present

## 2024-07-03 DIAGNOSIS — Z0000 Encounter for general adult medical examination without abnormal findings: Secondary | ICD-10-CM | POA: Diagnosis not present

## 2024-07-03 DIAGNOSIS — E7800 Pure hypercholesterolemia, unspecified: Secondary | ICD-10-CM | POA: Diagnosis not present

## 2024-07-03 DIAGNOSIS — Z23 Encounter for immunization: Secondary | ICD-10-CM | POA: Diagnosis not present

## 2024-07-03 NOTE — Patient Instructions (Addendum)
 You had the flu shot today  Please have your Shingrix (shingles) shots done at your local pharmacy.  Please continue all other medications as before, and refills have been done if requested.  Please have the pharmacy call with any other refills you may need.  Please continue your efforts at being more active, low cholesterol diet, and weight control.  You are otherwise up to date with prevention measures today.  Please keep your appointments with your specialists as you may have planned  Your right ear was cleared today  We can hold on lab testing today as you mentioned  Please make an Appointment to return in 6 months, or sooner if needed
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
## 2024-07-04 ENCOUNTER — Encounter: Payer: Self-pay | Admitting: Internal Medicine

## 2024-07-04 ENCOUNTER — Encounter

## 2024-07-04 NOTE — Assessment & Plan Note (Signed)
 Lab Results  Component Value Date   LDLCALC 68 06/21/2023   Stable, pt to continue current statin  - diet, wt control

## 2024-07-04 NOTE — Assessment & Plan Note (Signed)
 Lab Results  Component Value Date   HGBA1C 5.9 06/21/2023   Stable, pt to continue current medical treatment   - diet,wt control

## 2024-07-04 NOTE — Assessment & Plan Note (Signed)
Age and sex appropriate education and counseling updated with regular exercise and diet Referrals for preventative services - none needed Immunizations addressed - for flu shot today Smoking counseling  - none needed Evidence for depression or other mood disorder - none significant Most recent labs reviewed. I have personally reviewed and have noted: 1) the patient's medical and social history 2) The patient's current medications and supplements 3) The patient's height, weight, and BMI have been recorded in the chart  

## 2024-07-04 NOTE — Assessment & Plan Note (Signed)
 Resolved today with irrigation, hearing improved,  to f/u any worsening symptoms or concerns

## 2024-07-05 ENCOUNTER — Ambulatory Visit

## 2024-07-07 ENCOUNTER — Encounter

## 2024-07-08 ENCOUNTER — Ambulatory Visit

## 2024-07-08 DIAGNOSIS — I428 Other cardiomyopathies: Secondary | ICD-10-CM

## 2024-07-09 LAB — CUP PACEART REMOTE DEVICE CHECK
Date Time Interrogation Session: 20260119233241
Implantable Pulse Generator Implant Date: 20240702

## 2024-07-11 NOTE — Progress Notes (Signed)
 Remote Loop Recorder Transmission

## 2024-07-15 ENCOUNTER — Ambulatory Visit: Payer: Self-pay | Admitting: Cardiovascular Disease

## 2024-07-16 ENCOUNTER — Telehealth: Payer: Self-pay

## 2024-07-16 DIAGNOSIS — L989 Disorder of the skin and subcutaneous tissue, unspecified: Secondary | ICD-10-CM

## 2024-07-16 NOTE — Telephone Encounter (Signed)
 Copied from CRM #[id number]. Topic: Referral - Request for Referral >> Jul 16, 2024  1:19 PM Emylou G wrote: Did the patient discuss referral with their provider in the last year? No (If No - schedule appointment) (If Yes - send message)  Appointment offered? yes  Type of order/referral and detailed reason for visit: Dermatologist he has seen for years and needs referral per the insurance.. Can we please date the referral for today.. he had an appt today..wasn't aware of the new insurance guidelines.. Dr Shona said will give refund of part of copay if we get the referral in.SABRA also has another appt next month 2/15  Preference of office, provider, location: Dr Shona in [facility] [address] [place], [place] [address] [phone]  If referral order, have you been seen by this specialty before? Yes (If Yes, this issue or another issue? When? Where?  Can we respond through MyChart? No

## 2024-07-17 NOTE — Telephone Encounter (Signed)
 Ok sure this referral is done    thanks

## 2024-07-23 NOTE — Telephone Encounter (Unsigned)
 Copied from CRM #[id number]. Topic: Referral - Status >> Jul 23, 2024 11:27 AM Carlyon D wrote: Reason for CRM: Pt is calling in regards to referral status and is asking for a call back. Pt requested referral 1/28 for dermatology apt. That is coming up. Pt would also like a call back in regards

## 2024-07-24 NOTE — Telephone Encounter (Signed)
 Called and let Pt know the referral has already been sent.

## 2024-08-04 ENCOUNTER — Encounter

## 2024-08-05 ENCOUNTER — Ambulatory Visit

## 2024-08-07 ENCOUNTER — Ambulatory Visit: Payer: Medicare Other | Admitting: Neurology

## 2024-08-07 ENCOUNTER — Encounter

## 2024-08-08 ENCOUNTER — Ambulatory Visit

## 2024-09-01 ENCOUNTER — Ambulatory Visit: Admitting: Podiatry

## 2024-09-04 ENCOUNTER — Encounter

## 2024-09-05 ENCOUNTER — Ambulatory Visit

## 2024-09-08 ENCOUNTER — Ambulatory Visit

## 2024-09-08 ENCOUNTER — Encounter

## 2024-10-05 ENCOUNTER — Encounter

## 2024-10-06 ENCOUNTER — Ambulatory Visit

## 2024-10-09 ENCOUNTER — Ambulatory Visit

## 2024-10-09 ENCOUNTER — Encounter

## 2024-12-04 ENCOUNTER — Ambulatory Visit: Admitting: Family Medicine
